# Patient Record
Sex: Male | Born: 1940
Health system: Southern US, Community
[De-identification: ages and names within clinical notes are randomized; demographics above are authoritative.]

## PROBLEM LIST (undated history)

## (undated) DIAGNOSIS — E785 Hyperlipidemia, unspecified: Secondary | ICD-10-CM

## (undated) DIAGNOSIS — Z87442 Personal history of urinary calculi: Secondary | ICD-10-CM

## (undated) DIAGNOSIS — J45909 Unspecified asthma, uncomplicated: Secondary | ICD-10-CM

## (undated) DIAGNOSIS — J309 Allergic rhinitis, unspecified: Secondary | ICD-10-CM

## (undated) DIAGNOSIS — E119 Type 2 diabetes mellitus without complications: Secondary | ICD-10-CM

## (undated) DIAGNOSIS — N529 Male erectile dysfunction, unspecified: Secondary | ICD-10-CM

## (undated) DIAGNOSIS — Z83719 Family history of colon polyps, unspecified: Secondary | ICD-10-CM

## (undated) DIAGNOSIS — L719 Rosacea, unspecified: Secondary | ICD-10-CM

## (undated) DIAGNOSIS — Z8371 Family history of colonic polyps: Secondary | ICD-10-CM

## (undated) DIAGNOSIS — I10 Essential (primary) hypertension: Secondary | ICD-10-CM

## (undated) DIAGNOSIS — I2510 Atherosclerotic heart disease of native coronary artery without angina pectoris: Secondary | ICD-10-CM

## (undated) DIAGNOSIS — Z8719 Personal history of other diseases of the digestive system: Secondary | ICD-10-CM

## (undated) DIAGNOSIS — I999 Unspecified disorder of circulatory system: Secondary | ICD-10-CM

## (undated) DIAGNOSIS — R0602 Shortness of breath: Secondary | ICD-10-CM

## (undated) HISTORY — DX: Family history of colonic polyps: Z83.71

## (undated) HISTORY — DX: Family history of colon polyps, unspecified: Z83.719

## (undated) HISTORY — DX: Hyperlipidemia, unspecified: E78.5

## (undated) HISTORY — DX: Shortness of breath: R06.02

## (undated) HISTORY — DX: Type 2 diabetes mellitus without complications: E11.9

## (undated) HISTORY — DX: Personal history of other diseases of the digestive system: Z87.19

## (undated) HISTORY — DX: Rosacea, unspecified: L71.9

## (undated) HISTORY — DX: Male erectile dysfunction, unspecified: N52.9

## (undated) HISTORY — DX: Unspecified asthma, uncomplicated: J45.909

## (undated) HISTORY — DX: Allergic rhinitis, unspecified: J30.9

## (undated) HISTORY — DX: Essential (primary) hypertension: I10

## (undated) HISTORY — DX: Unspecified disorder of circulatory system: I99.9

## (undated) HISTORY — DX: Personal history of urinary calculi: Z87.442

---

## 2002-09-21 ENCOUNTER — Ambulatory Visit (HOSPITAL_COMMUNITY): Admission: RE | Admit: 2002-09-21 | Discharge: 2002-09-21 | Payer: Self-pay | Admitting: Gastroenterology

## 2006-02-07 ENCOUNTER — Ambulatory Visit: Payer: Self-pay | Admitting: Internal Medicine

## 2006-02-07 LAB — CONVERTED CEMR LAB
ALT: 31 units/L (ref 0–40)
AST: 29 units/L (ref 0–37)
Albumin: 4 g/dL (ref 3.5–5.2)
Alkaline Phosphatase: 60 units/L (ref 39–117)
BUN: 31 mg/dL — ABNORMAL HIGH (ref 6–23)
CO2: 29 meq/L (ref 19–32)
Calcium: 9.8 mg/dL (ref 8.4–10.5)
Chloride: 103 meq/L (ref 96–112)
Creatinine, Ser: 1.3 mg/dL (ref 0.4–1.5)
GFR calc non Af Amer: 59 mL/min
Glomerular Filtration Rate, Af Am: 71 mL/min/{1.73_m2}
Glucose, Bld: 158 mg/dL — ABNORMAL HIGH (ref 70–99)
HCT: 45.4 % (ref 39.0–52.0)
Hemoglobin: 15.1 g/dL (ref 13.0–17.0)
Hgb A1c MFr Bld: 6.8 % — ABNORMAL HIGH (ref 4.6–6.0)
MCHC: 33.2 g/dL (ref 30.0–36.0)
MCV: 87.7 fL (ref 78.0–100.0)
Platelets: 187 10*3/uL (ref 150–400)
Potassium: 5.4 meq/L — ABNORMAL HIGH (ref 3.5–5.1)
RBC: 5.18 M/uL (ref 4.22–5.81)
RDW: 12.7 % (ref 11.5–14.6)
Sodium: 142 meq/L (ref 135–145)
Total Bilirubin: 0.7 mg/dL (ref 0.3–1.2)
Total Protein: 7.2 g/dL (ref 6.0–8.3)
WBC: 8.1 10*3/uL (ref 4.5–10.5)

## 2006-06-04 LAB — HM COLONOSCOPY

## 2006-06-20 ENCOUNTER — Ambulatory Visit (HOSPITAL_COMMUNITY): Admission: RE | Admit: 2006-06-20 | Discharge: 2006-06-20 | Payer: Self-pay | Admitting: Gastroenterology

## 2006-07-13 ENCOUNTER — Ambulatory Visit: Payer: Self-pay | Admitting: Internal Medicine

## 2006-07-13 LAB — CONVERTED CEMR LAB
AST: 28 units/L (ref 0–37)
BUN: 30 mg/dL — ABNORMAL HIGH (ref 6–23)
CO2: 28 meq/L (ref 19–32)
Calcium: 9.4 mg/dL (ref 8.4–10.5)
Chloride: 112 meq/L (ref 96–112)
Creatinine, Ser: 1.2 mg/dL (ref 0.4–1.5)
GFR calc Af Amer: 78 mL/min
GFR calc non Af Amer: 65 mL/min
Glucose, Bld: 151 mg/dL — ABNORMAL HIGH (ref 70–99)
Hgb A1c MFr Bld: 6.9 % — ABNORMAL HIGH (ref 4.6–6.0)
Potassium: 5.4 meq/L — ABNORMAL HIGH (ref 3.5–5.1)
Sodium: 144 meq/L (ref 135–145)
Total CK: 237 units/L (ref 7–195)

## 2006-11-08 ENCOUNTER — Encounter: Payer: Self-pay | Admitting: Internal Medicine

## 2006-11-09 ENCOUNTER — Ambulatory Visit: Payer: Self-pay | Admitting: Internal Medicine

## 2006-11-09 DIAGNOSIS — I10 Essential (primary) hypertension: Secondary | ICD-10-CM | POA: Insufficient documentation

## 2006-11-09 DIAGNOSIS — J309 Allergic rhinitis, unspecified: Secondary | ICD-10-CM

## 2006-11-09 DIAGNOSIS — E1129 Type 2 diabetes mellitus with other diabetic kidney complication: Secondary | ICD-10-CM | POA: Insufficient documentation

## 2006-11-09 DIAGNOSIS — E785 Hyperlipidemia, unspecified: Secondary | ICD-10-CM

## 2006-11-09 HISTORY — DX: Essential (primary) hypertension: I10

## 2006-11-09 HISTORY — DX: Allergic rhinitis, unspecified: J30.9

## 2006-11-09 HISTORY — DX: Hyperlipidemia, unspecified: E78.5

## 2006-11-09 LAB — CONVERTED CEMR LAB: Hgb A1c MFr Bld: 6 % (ref 4.6–6.0)

## 2006-11-16 DIAGNOSIS — J45909 Unspecified asthma, uncomplicated: Secondary | ICD-10-CM

## 2006-11-16 DIAGNOSIS — Z8719 Personal history of other diseases of the digestive system: Secondary | ICD-10-CM

## 2006-11-16 HISTORY — DX: Unspecified asthma, uncomplicated: J45.909

## 2006-11-16 HISTORY — DX: Personal history of other diseases of the digestive system: Z87.19

## 2007-02-28 ENCOUNTER — Ambulatory Visit: Payer: Self-pay | Admitting: Internal Medicine

## 2007-02-28 ENCOUNTER — Telehealth: Payer: Self-pay | Admitting: Internal Medicine

## 2007-02-28 LAB — CONVERTED CEMR LAB
ALT: 33 units/L (ref 0–53)
AST: 28 units/L (ref 0–37)
Albumin: 4.2 g/dL (ref 3.5–5.2)
Alkaline Phosphatase: 60 units/L (ref 39–117)
BUN: 36 mg/dL — ABNORMAL HIGH (ref 6–23)
Basophils Absolute: 0 10*3/uL (ref 0.0–0.1)
Basophils Relative: 0 % (ref 0.0–1.0)
Bilirubin, Direct: 0.2 mg/dL (ref 0.0–0.3)
Blood in Urine, dipstick: NEGATIVE
CO2: 27 meq/L (ref 19–32)
Calcium: 9.6 mg/dL (ref 8.4–10.5)
Chloride: 106 meq/L (ref 96–112)
Cholesterol: 126 mg/dL (ref 0–200)
Creatinine, Ser: 1.4 mg/dL (ref 0.4–1.5)
Creatinine,U: 307.2 mg/dL
Eosinophils Absolute: 0.2 10*3/uL (ref 0.0–0.6)
Eosinophils Relative: 3.4 % (ref 0.0–5.0)
GFR calc Af Amer: 65 mL/min
GFR calc non Af Amer: 54 mL/min
Glucose, Bld: 155 mg/dL — ABNORMAL HIGH (ref 70–99)
Glucose, Urine, Semiquant: NEGATIVE
HCT: 41.3 % (ref 39.0–52.0)
HDL: 32.6 mg/dL — ABNORMAL LOW (ref >39.0)
Hemoglobin: 14.7 g/dL (ref 13.0–17.0)
Hgb A1c MFr Bld: 6.5 % — ABNORMAL HIGH (ref 4.6–6.0)
LDL Cholesterol: 74 mg/dL (ref 0–99)
Lymphocytes Relative: 22.3 % (ref 12.0–46.0)
MCHC: 35.6 g/dL (ref 30.0–36.0)
MCV: 85.8 fL (ref 78.0–100.0)
Microalb Creat Ratio: 63.5 mg/g — ABNORMAL HIGH (ref 0.0–30.0)
Microalb, Ur: 19.5 mg/dL — ABNORMAL HIGH (ref 0.0–1.9)
Monocytes Absolute: 0.5 10*3/uL (ref 0.2–0.7)
Monocytes Relative: 7.6 % (ref 3.0–11.0)
Neutro Abs: 4.5 10*3/uL (ref 1.4–7.7)
Neutrophils Relative %: 66.7 % (ref 43.0–77.0)
Nitrite: NEGATIVE
PSA: 1.21 ng/mL (ref 0.10–4.00)
Platelets: 144 10*3/uL — ABNORMAL LOW (ref 150–400)
Potassium: 5 meq/L (ref 3.5–5.1)
RBC: 4.81 M/uL (ref 4.22–5.81)
RDW: 12.6 % (ref 11.5–14.6)
Sodium: 141 meq/L (ref 135–145)
Specific Gravity, Urine: >=1.03
TSH: 1.04 microintl units/mL (ref 0.35–5.50)
Total Bilirubin: 0.8 mg/dL (ref 0.3–1.2)
Total CHOL/HDL Ratio: 3.9
Total Protein: 7.1 g/dL (ref 6.0–8.3)
Triglycerides: 95 mg/dL (ref 0–149)
Urobilinogen, UA: 0.2
VLDL: 19 mg/dL (ref 0–40)
WBC Urine, dipstick: NEGATIVE
WBC: 6.7 10*3/uL (ref 4.5–10.5)
pH: 5.5

## 2007-03-14 ENCOUNTER — Ambulatory Visit: Payer: Self-pay | Admitting: Internal Medicine

## 2007-03-21 ENCOUNTER — Telehealth: Payer: Self-pay | Admitting: Internal Medicine

## 2007-03-28 ENCOUNTER — Encounter: Payer: Self-pay | Admitting: Internal Medicine

## 2007-06-09 ENCOUNTER — Telehealth: Payer: Self-pay | Admitting: Internal Medicine

## 2007-07-12 ENCOUNTER — Ambulatory Visit: Payer: Self-pay | Admitting: Internal Medicine

## 2007-07-12 LAB — CONVERTED CEMR LAB: Hgb A1c MFr Bld: 6.7 % — ABNORMAL HIGH (ref 4.6–6.0)

## 2007-11-10 ENCOUNTER — Ambulatory Visit: Payer: Self-pay | Admitting: Internal Medicine

## 2007-11-10 LAB — CONVERTED CEMR LAB: Hgb A1c MFr Bld: 6.9 % — ABNORMAL HIGH (ref 4.6–6.0)

## 2008-03-07 ENCOUNTER — Ambulatory Visit: Payer: Self-pay | Admitting: Internal Medicine

## 2008-03-07 LAB — CONVERTED CEMR LAB: Hgb A1c MFr Bld: 6.5 % — ABNORMAL HIGH (ref 4.6–6.0)

## 2008-04-02 ENCOUNTER — Encounter: Payer: Self-pay | Admitting: Internal Medicine

## 2008-07-11 ENCOUNTER — Ambulatory Visit: Payer: Self-pay | Admitting: Internal Medicine

## 2008-07-11 LAB — CONVERTED CEMR LAB
ALT: 27 units/L (ref 0–53)
AST: 26 units/L (ref 0–37)
Albumin: 4.1 g/dL (ref 3.5–5.2)
Alkaline Phosphatase: 45 units/L (ref 39–117)
BUN: 39 mg/dL — ABNORMAL HIGH (ref 6–23)
Basophils Absolute: 0 10*3/uL (ref 0.0–0.1)
Basophils Relative: 0.1 % (ref 0.0–3.0)
Bilirubin Urine: NEGATIVE
Bilirubin, Direct: 0 mg/dL (ref 0.0–0.3)
Blood in Urine, dipstick: NEGATIVE
CO2: 27 meq/L (ref 19–32)
Calcium: 9.6 mg/dL (ref 8.4–10.5)
Chloride: 109 meq/L (ref 96–112)
Cholesterol: 103 mg/dL (ref 0–200)
Creatinine, Ser: 1.4 mg/dL (ref 0.4–1.5)
Creatinine,U: 241.1 mg/dL
Eosinophils Absolute: 0.2 10*3/uL (ref 0.0–0.7)
Eosinophils Relative: 3.7 % (ref 0.0–5.0)
GFR calc non Af Amer: 53.71 mL/min (ref >60)
Glucose, Bld: 115 mg/dL — ABNORMAL HIGH (ref 70–99)
Glucose, Urine, Semiquant: NEGATIVE
HCT: 41 % (ref 39.0–52.0)
HDL: 33.6 mg/dL — ABNORMAL LOW (ref >39.00)
Hemoglobin: 14 g/dL (ref 13.0–17.0)
Hgb A1c MFr Bld: 6.3 % (ref 4.6–6.5)
LDL Cholesterol: 51 mg/dL (ref 0–99)
Lymphocytes Relative: 26.9 % (ref 12.0–46.0)
Lymphs Abs: 1.5 10*3/uL (ref 0.7–4.0)
MCHC: 34.1 g/dL (ref 30.0–36.0)
MCV: 87.6 fL (ref 78.0–100.0)
Microalb Creat Ratio: 7.1 mg/g (ref 0.0–30.0)
Microalb, Ur: 1.7 mg/dL (ref 0.0–1.9)
Monocytes Absolute: 0.4 10*3/uL (ref 0.1–1.0)
Monocytes Relative: 7.2 % (ref 3.0–12.0)
Neutro Abs: 3.6 10*3/uL (ref 1.4–7.7)
Neutrophils Relative %: 62.1 % (ref 43.0–77.0)
Nitrite: NEGATIVE
PSA: 1.02 ng/mL (ref 0.10–4.00)
Platelets: 151 10*3/uL (ref 150.0–400.0)
Potassium: 4.9 meq/L (ref 3.5–5.1)
RBC: 4.68 M/uL (ref 4.22–5.81)
RDW: 12.9 % (ref 11.5–14.6)
Sodium: 144 meq/L (ref 135–145)
Specific Gravity, Urine: 1.03
TSH: 1.1 microintl units/mL (ref 0.35–5.50)
Total Bilirubin: 0.9 mg/dL (ref 0.3–1.2)
Total CHOL/HDL Ratio: 3
Total Protein: 7.4 g/dL (ref 6.0–8.3)
Triglycerides: 91 mg/dL (ref 0.0–149.0)
Urobilinogen, UA: 0.2
VLDL: 18.2 mg/dL (ref 0.0–40.0)
WBC Urine, dipstick: NEGATIVE
WBC: 5.7 10*3/uL (ref 4.5–10.5)
pH: 5.5

## 2008-07-18 ENCOUNTER — Ambulatory Visit: Payer: Self-pay | Admitting: Internal Medicine

## 2008-08-01 ENCOUNTER — Telehealth (INDEPENDENT_AMBULATORY_CARE_PROVIDER_SITE_OTHER): Payer: Self-pay | Admitting: *Deleted

## 2008-08-05 ENCOUNTER — Encounter: Payer: Self-pay | Admitting: Internal Medicine

## 2008-08-05 ENCOUNTER — Ambulatory Visit: Payer: Self-pay

## 2008-08-06 ENCOUNTER — Telehealth: Payer: Self-pay | Admitting: Internal Medicine

## 2008-08-19 ENCOUNTER — Telehealth: Payer: Self-pay | Admitting: Internal Medicine

## 2008-08-20 ENCOUNTER — Encounter: Payer: Self-pay | Admitting: Internal Medicine

## 2008-08-20 DIAGNOSIS — I999 Unspecified disorder of circulatory system: Secondary | ICD-10-CM | POA: Insufficient documentation

## 2008-09-05 ENCOUNTER — Ambulatory Visit: Payer: Self-pay | Admitting: Internal Medicine

## 2008-09-05 DIAGNOSIS — R0602 Shortness of breath: Secondary | ICD-10-CM

## 2008-09-05 HISTORY — DX: Shortness of breath: R06.02

## 2008-09-09 ENCOUNTER — Telehealth (INDEPENDENT_AMBULATORY_CARE_PROVIDER_SITE_OTHER): Payer: Self-pay | Admitting: *Deleted

## 2008-09-10 LAB — CONVERTED CEMR LAB
BUN: 37 mg/dL — ABNORMAL HIGH (ref 6–23)
CO2: 28 meq/L (ref 19–32)
Calcium: 9.9 mg/dL (ref 8.4–10.5)
Chloride: 107 meq/L (ref 96–112)
Creatinine, Ser: 1.5 mg/dL (ref 0.4–1.5)
GFR calc non Af Amer: 49.57 mL/min (ref >60)
Glucose, Bld: 141 mg/dL — ABNORMAL HIGH (ref 70–99)
Potassium: 4.5 meq/L (ref 3.5–5.1)
Sodium: 140 meq/L (ref 135–145)

## 2008-09-12 ENCOUNTER — Ambulatory Visit: Payer: Self-pay | Admitting: Cardiovascular Disease

## 2008-09-12 ENCOUNTER — Encounter: Payer: Self-pay | Admitting: Internal Medicine

## 2008-09-12 ENCOUNTER — Ambulatory Visit (HOSPITAL_COMMUNITY): Admission: RE | Admit: 2008-09-12 | Discharge: 2008-09-12 | Payer: Self-pay | Admitting: Internal Medicine

## 2008-11-21 ENCOUNTER — Ambulatory Visit: Payer: Self-pay | Admitting: Internal Medicine

## 2008-11-25 LAB — CONVERTED CEMR LAB: Hgb A1c MFr Bld: 6.5 % (ref 4.6–6.5)

## 2009-03-06 ENCOUNTER — Ambulatory Visit: Payer: Self-pay | Admitting: Internal Medicine

## 2009-03-06 LAB — CONVERTED CEMR LAB
Blood Glucose, Fingerstick: 137
Hgb A1c MFr Bld: 6.3 % (ref 4.6–6.5)

## 2009-07-14 ENCOUNTER — Ambulatory Visit: Payer: Self-pay | Admitting: Internal Medicine

## 2009-07-14 LAB — CONVERTED CEMR LAB
ALT: 38 units/L (ref 0–53)
AST: 26 units/L (ref 0–37)
Albumin: 4.1 g/dL (ref 3.5–5.2)
Alkaline Phosphatase: 47 units/L (ref 39–117)
BUN: 38 mg/dL — ABNORMAL HIGH (ref 6–23)
Basophils Absolute: 0 10*3/uL (ref 0.0–0.1)
Basophils Relative: 0.5 % (ref 0.0–3.0)
Bilirubin Urine: NEGATIVE
Bilirubin, Direct: 0.1 mg/dL (ref 0.0–0.3)
Blood in Urine, dipstick: NEGATIVE
CO2: 25 meq/L (ref 19–32)
Calcium: 9.1 mg/dL (ref 8.4–10.5)
Chloride: 110 meq/L (ref 96–112)
Cholesterol: 102 mg/dL (ref 0–200)
Creatinine, Ser: 1.3 mg/dL (ref 0.4–1.5)
Creatinine,U: 203.7 mg/dL
Eosinophils Absolute: 0.3 10*3/uL (ref 0.0–0.7)
Eosinophils Relative: 4.1 % (ref 0.0–5.0)
GFR calc non Af Amer: 58.32 mL/min (ref >60)
Glucose, Bld: 137 mg/dL — ABNORMAL HIGH (ref 70–99)
Glucose, Urine, Semiquant: NEGATIVE
HCT: 41.3 % (ref 39.0–52.0)
HDL: 37.5 mg/dL — ABNORMAL LOW (ref >39.00)
Hemoglobin: 14.1 g/dL (ref 13.0–17.0)
Hgb A1c MFr Bld: 6.5 % (ref 4.6–6.5)
Ketones, urine, test strip: NEGATIVE
LDL Cholesterol: 48 mg/dL (ref 0–99)
Lymphocytes Relative: 27.5 % (ref 12.0–46.0)
Lymphs Abs: 1.7 10*3/uL (ref 0.7–4.0)
MCHC: 34.2 g/dL (ref 30.0–36.0)
MCV: 87.3 fL (ref 78.0–100.0)
Microalb Creat Ratio: 11.3 mg/g (ref 0.0–30.0)
Microalb, Ur: 2.3 mg/dL — ABNORMAL HIGH (ref 0.0–1.9)
Monocytes Absolute: 0.4 10*3/uL (ref 0.1–1.0)
Monocytes Relative: 6.9 % (ref 3.0–12.0)
Neutro Abs: 3.9 10*3/uL (ref 1.4–7.7)
Neutrophils Relative %: 61 % (ref 43.0–77.0)
Nitrite: NEGATIVE
PSA: 1.28 ng/mL (ref 0.10–4.00)
Platelets: 145 10*3/uL — ABNORMAL LOW (ref 150.0–400.0)
Potassium: 4.6 meq/L (ref 3.5–5.1)
Protein, U semiquant: NEGATIVE
RBC: 4.73 M/uL (ref 4.22–5.81)
RDW: 13.9 % (ref 11.5–14.6)
Sodium: 142 meq/L (ref 135–145)
Specific Gravity, Urine: 1.02
TSH: 1.19 microintl units/mL (ref 0.35–5.50)
Total Bilirubin: 0.5 mg/dL (ref 0.3–1.2)
Total CHOL/HDL Ratio: 3
Total Protein: 7.1 g/dL (ref 6.0–8.3)
Triglycerides: 82 mg/dL (ref 0.0–149.0)
Urobilinogen, UA: 0.2
VLDL: 16.4 mg/dL (ref 0.0–40.0)
WBC Urine, dipstick: NEGATIVE
WBC: 6.3 10*3/uL (ref 4.5–10.5)
pH: 5

## 2009-07-24 ENCOUNTER — Ambulatory Visit: Payer: Self-pay | Admitting: Internal Medicine

## 2009-07-24 LAB — HM DIABETES EYE EXAM: HM Diabetic Eye Exam: NORMAL

## 2009-07-30 ENCOUNTER — Telehealth: Payer: Self-pay | Admitting: Internal Medicine

## 2009-11-20 ENCOUNTER — Ambulatory Visit: Payer: Self-pay | Admitting: Internal Medicine

## 2009-11-20 LAB — CONVERTED CEMR LAB
Blood Glucose, Fingerstick: 125
Hgb A1c MFr Bld: 6.2 % (ref 4.6–6.5)

## 2009-11-20 LAB — HM DIABETES FOOT EXAM

## 2010-03-19 ENCOUNTER — Ambulatory Visit: Payer: Self-pay | Admitting: Internal Medicine

## 2010-03-19 LAB — CONVERTED CEMR LAB: Hgb A1c MFr Bld: 6.5 % (ref 4.6–6.5)

## 2010-04-07 ENCOUNTER — Telehealth: Payer: Self-pay | Admitting: Internal Medicine

## 2010-05-05 NOTE — Assessment & Plan Note (Signed)
Summary: 4 month fup//ccm   Vital Signs:  Patient profile:   70 year old male Weight:      206 pounds Temp:     98.1 degrees F oral BP sitting:   130 / 80  (left arm) Cuff size:   large  Vitals Entered By: Clearnce Sorrel CMA (November 20, 2009 8:20 AM) CC: 4 mth fup, src Is Patient Diabetic? Yes CBG Result 125   Primary Care Provider:  Marletta Lor  MD  CC:  4 mth fup and src.  History of Present Illness: 70 year-old patient who is in today for follow-up of his type 2 diabetes.  He continues to do quite well.  He has treated hypertension and dyslipidemia.  He was seen 4 months ago for a physical and hemoglobin A1c was 6.5.  He denies any cardiopulmonary complaints.  He has a history of asthma, which has been stable.  Fasting blood sugars are generally in the 125 range.  His glycemic control has not deteriorated.  He remains on metformin therapy only.  Current Medications (verified): 1)  Simvastatin 40 Mg  Tabs (Simvastatin) .[name] Kitchen.. 1 Once Daily 2)  Fexofenadine Hcl 180 Mg  Tabs (Fexofenadine Hcl) .[name] Kitchen.. 1 Once Daily 3)  Metformin  Er Hcl 500 Mg  Tabs (Metformin Hcl) .... 2 Am, 2 At Bedtime 4)  Doxazosin Mesylate 4 Mg  Tabs (Doxazosin Mesylate) .[name] Kitchen.. 1 Once Daily 5)  Adult Aspirin Ec Low Strength 81 Mg  Tbec (Aspirin) 6)  Viagra 50 Mg Tabs (Sildenafil Citrate) .... As Directed 7)  Metrogel 1 % Gel (Metronidazole) .... Use  Twice Daily 8)  Diovan 320 Mg Tabs (Valsartan) .... One Daily 9)  Accu-Chek Aviva  Strp (Glucose Blood) .... Use Daily As Directed  Allergies (verified): 1)  ! Sulf-10  Past History:  Past Medical History: Reviewed history from 03/07/2008 and no changes required. Diabetes mellitus, type II Hyperlipidemia Hypertension Allergic rhinitis colonic polyps kidney stones diverticulosis dyslipidemia High Cholesterol Asthma Diverticulitis, hx of rosace ED  Past Surgical History: Reviewed history from 03/14/2007 and no changes required. Denies surgical  history colonoscopy March 2008  Review of Systems  The patient denies anorexia, fever, weight loss, weight gain, vision loss, decreased hearing, hoarseness, chest pain, syncope, dyspnea on exertion, peripheral edema, prolonged cough, headaches, hemoptysis, abdominal pain, melena, hematochezia, severe indigestion/heartburn, hematuria, incontinence, genital sores, muscle weakness, suspicious skin lesions, transient blindness, difficulty walking, depression, unusual weight change, abnormal bleeding, enlarged lymph nodes, angioedema, breast masses, and testicular masses.    Physical Exam  General:  overweight-appearing.  126/82overweight-appearing.   Head:  Normocephalic and atraumatic without obvious abnormalities. No apparent alopecia or balding. Eyes:  No corneal or conjunctival inflammation noted. EOMI. Perrla. Funduscopic exam benign, without hemorrhages, exudates or papilledema. Vision grossly normal. Mouth:  Oral mucosa and oropharynx without lesions or exudates.  Teeth in good repair. Neck:  No deformities, masses, or tenderness noted. Lungs:  Normal respiratory effort, chest expands symmetrically. Lungs are clear to auscultation, no crackles or wheezes. Heart:  Normal rate and regular rhythm. S1 and S2 normal without gallop, murmur, click, rub or other extra sounds. Abdomen:  Bowel sounds positive,abdomen soft and non-tender without masses, organomegaly or hernias noted. Msk:  No deformity or scoliosis noted of thoracic or lumbar spine.   Pulses:  R and L carotid,radial,femoral,dorsalis pedis and posterior tibial pulses are full and equal bilaterally  Diabetes Management Exam:    Foot Exam (with socks and/or shoes not present):  Sensory-Pinprick/Light touch:          Left medial foot (L-4): normal          Left dorsal foot (L-5): normal          Left lateral foot (S-1): normal          Right medial foot (L-4): normal          Right dorsal foot (L-5): normal          Right  lateral foot (S-1): normal       Sensory-Monofilament:          Left foot: normal          Right foot: normal       Inspection:          Left foot: normal          Right foot: normal       Nails:          Left foot: normal          Right foot: normal    Foot Exam by Podiatrist:       Date: 11/20/2009       Results: no diabetic findings       Done by: PCP   Impression & Recommendations:  Problem # 1:  HYPERTENSION (ICD-401.9)  His updated medication list for this problem includes:    Doxazosin Mesylate 4 Mg Tabs (Doxazosin mesylate) .[name] Kitchen... 1 once daily    Diovan 320 Mg Tabs (Valsartan) ..... One daily  His updated medication list for this problem includes:    Doxazosin Mesylate 4 Mg Tabs (Doxazosin mesylate) .[name] Kitchen... 1 once daily    Diovan 320 Mg Tabs (Valsartan) ..... One daily  Orders: Specimen Handling (99000)  Problem # 2:  HYPERLIPIDEMIA (ICD-272.4)  His updated medication list for this problem includes:    Simvastatin 40 Mg Tabs (Simvastatin) .[name] Kitchen... 1 once daily  His updated medication list for this problem includes:    Simvastatin 40 Mg Tabs (Simvastatin) .[name] Kitchen... 1 once daily  Orders: Specimen Handling (99000)  Problem # 3:  DIABETES MELLITUS, TYPE II (ICD-250.00)  His updated medication list for this problem includes:    Adult Aspirin Ec Low Strength 81 Mg Tbec (Aspirin)    Diovan 320 Mg Tabs (Valsartan) ..... One daily  Orders: Capillary Blood Glucose/CBG [id number]) Venipuncture [id number]) TLB-A1C / Hgb A1C (Glycohemoglobin) (83036-A1C) Specimen Handling (99000)  His updated medication list for this problem includes:    Adult Aspirin Ec Low Strength 81 Mg Tbec (Aspirin)    Diovan 320 Mg Tabs (Valsartan) ..... One daily  Complete Medication List: 1)  Simvastatin 40 Mg Tabs (Simvastatin) .[name] Kitchen.. 1 once daily 2)  Fexofenadine Hcl 180 Mg Tabs (Fexofenadine hcl) .[name] Kitchen.. 1 once daily 3)  Metformin Er Hcl 500 Mg Tabs (metformin Hcl)  .... 2 am, 2 at bedtime 4)  Doxazosin  Mesylate 4 Mg Tabs (Doxazosin mesylate) .[name] Kitchen.. 1 once daily 5)  Adult Aspirin Ec Low Strength 81 Mg Tbec (Aspirin) 6)  Viagra 50 Mg Tabs (Sildenafil citrate) .... As directed 7)  Metrogel 1 % Gel (Metronidazole) .... Use  twice daily 8)  Diovan 320 Mg Tabs (Valsartan) .... One daily 9)  Accu-chek Aviva Strp (Glucose blood) .... Use daily as directed  Patient Instructions: 1)  Please schedule a follow-up appointment in 4 months. 2)  Limit your Sodium (Salt) to less than 2 grams a day(slightly less than 1/2 a teaspoon) to prevent fluid retention, swelling, or worsening of symptoms. 3)  It is important that you exercise regularly at least 20 minutes 5 times a week. If you develop chest pain, have severe difficulty breathing, or feel very tired , stop exercising immediately and seek medical attention. 4)  You need to lose weight. Consider a lower calorie diet and regular exercise.  5)  Check your blood sugars regularly. If your readings are usually above : or below 70 you should contact our office. 6)  It is important that your Diabetic A1c level is checked every 3 months. 7)  See your eye doctor yearly to check for diabetic eye damage. Prescriptions: ACCU-CHEK AVIVA  STRP (GLUCOSE BLOOD) use daily as directed  #90 x 6   Entered and Authorized by:   Marletta Lor  MD   Signed by:   Marletta Lor  MD on 11/20/2009   Method used:   Print then Give to Patient   RxID:   [id number] DIOVAN 320 MG TABS (VALSARTAN) one daily  #90 x 6   Entered and Authorized by:   Marletta Lor  MD   Signed by:   Marletta Lor  MD on 11/20/2009   Method used:   Print then Give to Patient   RxID:   [id number] METROGEL 1 % GEL (METRONIDAZOLE) use  twice daily  #45 gm x 4   Entered and Authorized by:   Marletta Lor  MD   Signed by:   Marletta Lor  MD on 11/20/2009   Method used:   Print then Give to Patient   RxID:   [id number] VIAGRA 50 MG TABS (SILDENAFIL  CITRATE) as directed  #12 x 6   Entered and Authorized by:   Marletta Lor  MD   Signed by:   Marletta Lor  MD on 11/20/2009   Method used:   Print then Give to Patient   RxID:   [id number] DOXAZOSIN MESYLATE 4 MG  TABS (DOXAZOSIN MESYLATE) 1 once daily  #90 x 6   Entered and Authorized by:   Marletta Lor  MD   Signed by:   Marletta Lor  MD on 11/20/2009   Method used:   Print then Give to Patient   RxID:   [id number] FEXOFENADINE HCL 180 MG  TABS (FEXOFENADINE HCL) 1 once daily  #90 x 6   Entered and Authorized by:   Marletta Lor  MD   Signed by:   Marletta Lor  MD on 11/20/2009   Method used:   Print then Give to Patient   RxID:   [id number] SIMVASTATIN 40 MG  TABS (SIMVASTATIN) 1 once daily  #90 x 6   Entered and Authorized by:   Marletta Lor  MD   Signed by:   Marletta Lor  MD on 11/20/2009   Method used:   Print then Give to Patient   RxID:   [id number] [place] (GLUCOSE BLOOD) use daily as directed  #90 x 6   Entered and Authorized by:   Marletta Lor  MD   Signed by:   Marletta Lor  MD on 11/20/2009   Method used:   Electronically to        [facility] (mail-order)       [place]  [address]       Ph: [phone]       Fax: [phone]   RxID:  [id number] DIOVAN 320 MG TABS (VALSARTAN) one daily  #90 x 6   Entered and Authorized by:   Marletta Lor  MD   Signed by:   Marletta Lor  MD on 11/20/2009   Method used:   Electronically to        [facility] (mail-[place]  [address]       Ph: [phone]       Fax: [phone]   [id number] VIAGRA 50 MG TABS (SILDENAFIL CITRATE) as directed  #12 x 6   Entered and Authorized by:   Marletta Lor  MD   Signed by:   Marletta Lor  MD on 11/20/2009   Method used:    Electronically to        [facility] (mail-[place])       [place]  [address]       Ph: [phone]       Fax: [phone]   [id number] METROGEL 1 % GEL (METRONIDAZOLE) use  twice daily  #45 gm x 4   Entered and Authorized by:   Marletta Lor  MD   Signed by:   Marletta Lor  MD on 11/20/2009   Method used:   Electronically to        [facility] (mail-order)       [place]  [address]       Ph: [phone]       Fax: [phone]   [id number] DOXAZOSIN MESYLATE 4 MG  TABS (DOXAZOSIN MESYLATE) 1 once daily  #90 x 6   Entered and Authorized by:   Marletta Lor  MD   Signed by:   Marletta Lor  MD on 11/20/2009   Method used:   Electronically to        [facility] (mail-order)       [place]  [address]       Ph: [phone]       Fax: [phone]   [id number] METFORMIN  ER HCL 500 MG  TABS (METFORMIN HCL) 2 am, 2 at bedtime  #360 x 3   Entered and Authorized by:   Marletta Lor  MD   Signed by:   Marletta Lor  MD on 11/20/2009   Method used:   Print then Give to Patient   RxID:   [id number] FEXOFENADINE HCL 180 MG  TABS (FEXOFENADINE HCL) 1 once daily  #90 x 6   Entered and Authorized by:   Marletta Lor  MD   Signed by:   Marletta Lor  MD on 11/20/2009   Method used:   Electronically to        [facility] (mail-order)       [place]  [address]       Ph: [phone]       Fax: [phone]   [id number] SIMVASTATIN 40 MG  TABS (SIMVASTATIN) 1 once daily  #90 x 6   Entered and Authorized by:   Marletta Lor  MD   Signed  by:   Marletta Lor  MD on 11/20/2009   Method used:   Electronically to        [facility] (mail-order)       [place]  [address]       Ph: [phone]       Fax: [phone]   RxID:   [id number]

## 2010-05-05 NOTE — Progress Notes (Signed)
  Phone Note Call from Patient   Caller: Patient Call For: Marletta Lor  MD Summary of Call: Still waiting to hear if Dr Raliegh Ip talked to Dr Harrington Challenger about his stress test- she said there was a problem. Wants Dr Raliegh Ip to call. [phone]. Initial call taken by: Chipper Oman, RN,  Aug 19, 2008 8:49 AM    Please schedule a cardiology appointment with Dr. Harrington Challenger due to abnormal Cardiolite stress

## 2010-05-05 NOTE — Assessment & Plan Note (Signed)
Summary: cpx/njr   Vital Signs:  Patient profile:   70 year old male Height:      67.75 inches Weight:      207 pounds BMI:     31.82 BP sitting:   140 / 62  (left arm) Cuff size:   regular  Vitals Entered By: Chipper Oman, RN (July 18, 2008 9:40 AM)  CC:  OV and labs done.  BS 124. C/o low energy.[name] Kitchen  History of Present Illness: 70 year old male seen today for comprehensive evaluation.  Medical problems include dyslipidemia, hypertension, type 2 diabetes.  Laboratory studies were reviewed and his hemoglobin A1c6.3.  Other laboratory studies were stable.  Impression include some diminished exercise capacity, but no exertional chest pain. He has remained reasonably active.  He had a Cardiolite stress test 10 to 15 years ago.  He has remote history of asthma and allergic rhinitis, which have been stable  Problems Prior to Update: 1)  Physical Examination  (ICD-V70.0) 2)  Diverticulitis, Hx of  (ICD-V12.79) 3)  Asthma  (ICD-493.90) 4)  Allergic Rhinitis  (ICD-477.9) 5)  Hypertension  (ICD-401.9) 6)  Hyperlipidemia  (ICD-272.4) 7)  Diabetes Mellitus, Type II  (ICD-250.00)  Medications Prior to Update: 1)  Simvastatin 40 Mg  Tabs (Simvastatin) .[name] Kitchen.. 1 Once Daily 2)  Fexofenadine Hcl 180 Mg  Tabs (Fexofenadine Hcl) .[name] Kitchen.. 1 Once Daily 3)  Metformin  Er Hcl 500 Mg  Tabs (Metformin Hcl) .... 2 Am, 2 At Bedtime 4)  Doxazosin Mesylate 4 Mg  Tabs (Doxazosin Mesylate) .[name] Kitchen.. 1 Once Daily 5)  Adult Aspirin Ec Low Strength 81 Mg  Tbec (Aspirin) 6)  Diovan Hct 320-25 Mg  Tabs (Valsartan-Hydrochlorothiazide) .... One Daily 7)  Viagra 50 Mg Tabs (Sildenafil Citrate) .... As Directed 8)  Metrogel 1 % Gel (Metronidazole) .... Use  Twice Daily  Allergies: 1)  ! Sulf-10  Past History:  Past Medical History:    Reviewed history from 03/07/2008 and no changes required:    Diabetes mellitus, type II    Hyperlipidemia    Hypertension    Allergic rhinitis    colonic polyps    kidney stones  diverticulosis    dyslipidemia    High Cholesterol    Asthma    Diverticulitis, hx of    rosace    ED  Past Surgical History:    Reviewed history from 03/14/2007 and no changes required:    Denies surgical history    colonoscopy March 2008  Family History:    Reviewed history from 11/09/2006 and no changes required:       father died at 27 of lung cancer.  Also had prostate cancer       mother died at 68.  Hypertension, multiple myeloma       one sister living and well  Social History:    Reviewed history from 11/16/2006 and no changes required:       Occupation: [employer]       Married       Never Smoked       Alcohol use-yes       Drug use-no       Regular exercise-no  Review of Systems  The patient denies anorexia, fever, weight loss, weight gain, vision loss, decreased hearing, hoarseness, chest pain, syncope, dyspnea on exertion, peripheral edema, prolonged cough, headaches, hemoptysis, abdominal pain, melena, hematochezia, severe indigestion/heartburn, hematuria, incontinence, genital sores, muscle weakness, suspicious skin lesions, transient blindness, difficulty walking, depression, unusual weight change, abnormal bleeding, enlarged  lymph nodes, angioedema, breast masses, and testicular masses.    Physical Exam  General:  overweight-appearing.  140/64 Head:  Normocephalic and atraumatic without obvious abnormalities. No apparent alopecia or balding. Eyes:  No corneal or conjunctival inflammation noted. EOMI. Perrla. Funduscopic exam benign, without hemorrhages, exudates or papilledema. Vision grossly normal. Ears:  External ear exam shows no significant lesions or deformities.  Otoscopic examination reveals clear canals, tympanic membranes are intact bilaterally without bulging, retraction, inflammation or discharge. Hearing is grossly normal bilaterally. Nose:  External nasal examination shows no deformity or inflammation. Nasal mucosa are pink and moist without  lesions or exudates. Mouth:  Oral mucosa and oropharynx without lesions or exudates.  Teeth in good repair. Neck:  No deformities, masses, or tenderness noted. Chest Wall:  No deformities, masses, tenderness or gynecomastia noted. Breasts:  No masses or gynecomastia noted Lungs:  Normal respiratory effort, chest expands symmetrically. Lungs are clear to auscultation, no crackles or wheezes. Heart:  Normal rate and regular rhythm. S1 and S2 normal without gallop, murmur, click, rub or other extra sounds. Abdomen:  Bowel sounds positive,abdomen soft and non-tender without masses, organomegaly or hernias noted. Rectal:  No external abnormalities noted. Normal sphincter tone. No rectal masses or tenderness. Genitalia:  Testes bilaterally descended without nodularity, tenderness or masses. No scrotal masses or lesions. No penis lesions or urethral discharge. Prostate:  2+ enlarged.   Msk:  No deformity or scoliosis noted of thoracic or lumbar spine.   Pulses:  full Extremities:  No clubbing, cyanosis, edema, or deformity noted with normal full range of motion of all joints.   Neurologic:  No cranial nerve deficits noted. Station and gait are normal. Plantar reflexes are down-going bilaterally. DTRs are symmetrical throughout. Sensory, motor and coordinative functions appear intact. Skin:  Intact without suspicious lesions or rashes Cervical Nodes:  No lymphadenopathy noted Axillary Nodes:  No palpable lymphadenopathy Inguinal Nodes:  No significant adenopathy Psych:  Cognition and judgment appear intact. Alert and cooperative with normal attention span and concentration. No apparent delusions, illusions, hallucinations   Impression & Recommendations:  Problem # 1:  ALLERGIC RHINITIS (ICD-477.9)  His updated medication list for this problem includes:    Fexofenadine Hcl 180 Mg Tabs (Fexofenadine hcl) .[name] Kitchen... 1 once daily  Problem # 2:  HYPERTENSION (ICD-401.9)  His updated medication list for  this problem includes:    Doxazosin Mesylate 4 Mg Tabs (Doxazosin mesylate) .[name] Kitchen... 1 once daily    Diovan Hct 320-25 Mg Tabs (Valsartan-hydrochlorothiazide) ..... One daily  Orders: EKG w/ Interpretation (93000) Cardiolite (Cardiolite)  Problem # 3:  HYPERLIPIDEMIA (ICD-272.4)  His updated medication list for this problem includes:    Simvastatin 40 Mg Tabs (Simvastatin) .[name] Kitchen... 1 once daily  Orders: Cardiolite (Cardiolite)  Problem # 4:  DIABETES MELLITUS, TYPE II (ICD-250.00)  His updated medication list for this problem includes:    Adult Aspirin Ec Low Strength 81 Mg Tbec (Aspirin)    Diovan Hct 320-25 Mg Tabs (Valsartan-hydrochlorothiazide) ..... One daily  Orders: Cardiolite (Cardiolite)  Complete Medication List: 1)  Simvastatin 40 Mg Tabs (Simvastatin) .[name] Kitchen.. 1 once daily 2)  Fexofenadine Hcl 180 Mg Tabs (Fexofenadine hcl) .[name] Kitchen.. 1 once daily 3)  Metformin Er Hcl 500 Mg Tabs (metformin Hcl)  .... 2 am, 2 at bedtime 4)  Doxazosin Mesylate 4 Mg Tabs (Doxazosin mesylate) .[name] Kitchen.. 1 once daily 5)  Adult Aspirin Ec Low Strength 81 Mg Tbec (Aspirin) 6)  Diovan Hct 320-25 Mg Tabs (Valsartan-hydrochlorothiazide) .... One daily 7)  Viagra  50 Mg Tabs (Sildenafil citrate) .... As directed 8)  Metrogel 1 % Gel (Metronidazole) .... Use  twice daily  Other Orders: DRE [id number])  Patient Instructions: 1)  Please schedule a follow-up appointment in 4 months. 2)  It is important that you exercise regularly at least 20 minutes 5 times a week. If you develop chest pain, have severe difficulty breathing, or feel very tired , stop exercising immediately and seek medical attention. 3)  You need to lose weight. Consider a lower calorie diet and regular exercise.  4)  Take an Aspirin every day. 5)  Check your blood sugars regularly. If your readings are usually above : or below 70 you should contact our office. 6)  It is important that your Diabetic A1c level is checked every 3 months. 7)  See your  eye doctor yearly to check for diabetic eye damage. Prescriptions: METROGEL 1 % GEL (METRONIDAZOLE) use  twice daily  #45 gm x 4   Entered and Authorized by:   Marletta Lor  MD   Signed by:   Marletta Lor  MD on 07/18/2008   Method used:   Print then Give to Patient   RxID:   [id number] VIAGRA 50 MG TABS (SILDENAFIL CITRATE) as directed  #12 x 6   Entered and Authorized by:   Marletta Lor  MD   Signed by:   Marletta Lor  MD on 07/18/2008   Method used:   Print then Give to Patient   RxID:   [id number] DIOVAN HCT 320-25 MG  TABS (VALSARTAN-HYDROCHLOROTHIAZIDE) one daily  #90 x 6   Entered and Authorized by:   Marletta Lor  MD   Signed by:   Marletta Lor  MD on 07/18/2008   Method used:   Print then Give to Patient   RxID:   [id number] DOXAZOSIN MESYLATE 4 MG  TABS (DOXAZOSIN MESYLATE) 1 once daily  #90 x 6   Entered and Authorized by:   Marletta Lor  MD   Signed by:   Marletta Lor  MD on 07/18/2008   Method used:   Print then Give to Patient   RxID:   [id number] METFORMIN  ER HCL 500 MG  TABS (METFORMIN HCL) 2 am, 2 at bedtime  #360 x 6   Entered and Authorized by:   Marletta Lor  MD   Signed by:   Marletta Lor  MD on 07/18/2008   Method used:   Print then Give to Patient   RxID:   [id number] FEXOFENADINE HCL 180 MG  TABS (FEXOFENADINE HCL) 1 once daily  #90 x 6   Entered and Authorized by:   Marletta Lor  MD   Signed by:   Marletta Lor  MD on 07/18/2008   Method used:   Print then Give to Patient   RxID:   [id number] SIMVASTATIN 40 MG  TABS (SIMVASTATIN) 1 once daily  #90 x 6   Entered and Authorized by:   Marletta Lor  MD   Signed by:   Marletta Lor  MD on 07/18/2008   Method used:   Print then Give to Patient   RxID:   [id number]

## 2010-05-05 NOTE — Assessment & Plan Note (Signed)
Summary: 4 MONTH ROV/NJR   Vital Signs:  Patient profile:   70 year old male Weight:      205 pounds BMI:     31.28 Temp:     98 degrees F oral BP sitting:   126 / 72  (left arm) Cuff size:   regular  Vitals Entered By: Chipper Oman, RN (November 21, 2008 10:08 AM)  Primary Care Provider:  Marletta Lor  MD  CC:  ROV and FBS 112.[name] Kitchen  History of Present Illness: 70 year old patient who is seen today for follow-up.  He has  hypertension, type 2 diabetes, as well as dyslipidemia.  He has had a recent cardiac evaluation was also included a chest CT scan that was nonrevealing.  He does have a history of asthma, which has been stable.  His main complaint is decrease in his exercise capacity.  His diabetes has been stable.  His last hemoglobin A1c6.3  Allergies: 1)  ! Sulf-10  Past History:  Past Medical History: Reviewed history from 03/07/2008 and no changes required. Diabetes mellitus, type II Hyperlipidemia Hypertension Allergic rhinitis colonic polyps kidney stones diverticulosis dyslipidemia High Cholesterol Asthma Diverticulitis, hx of rosace ED  Physical Exam  General:  Well-developed,well-nourished,in no acute distress; alert,appropriate and cooperative throughout examination  120/80 Head:  Normocephalic and atraumatic without obvious abnormalities. No apparent alopecia or balding. Mouth:  Oral mucosa and oropharynx without lesions or exudates.  Teeth in good repair. Neck:  No deformities, masses, or tenderness noted. Lungs:  Normal respiratory effort, chest expands symmetrically. Lungs are clear to auscultation, no crackles or wheezes. Heart:  Normal rate and regular rhythm. S1 and S2 normal without gallop, murmur, click, rub or other extra sounds. Abdomen:  Bowel sounds positive,abdomen soft and non-tender without masses, organomegaly or hernias noted. Msk:  No deformity or scoliosis noted of thoracic or lumbar spine.   Pulses:  R and L  carotid,radial,femoral,dorsalis pedis and posterior tibial pulses are full and equal bilaterally Extremities:  No clubbing, cyanosis, edema, or deformity noted with normal full range of motion of all joints.     Impression & Recommendations:  Problem # 1:  SHORTNESS OF BREATH (ICD-786.05) will encourage the weight loss, exercise program; if difficulties continue will consider PFTs to rule out bronchospastic disease  Problem # 2:  HYPERTENSION (ICD-401.9)  The following medications were removed from the medication list:    Metoprolol Tartrate 50 Mg Tabs (Metoprolol tartrate) .[name] Kitchen... 1 tab night before cta and 1 tab am of cta His updated medication list for this problem includes:    Doxazosin Mesylate 4 Mg Tabs (Doxazosin mesylate) .[name] Kitchen... 1 once daily    Diovan Hct 320-25 Mg Tabs (Valsartan-hydrochlorothiazide) ..... One daily  The following medications were removed from the medication list:    Metoprolol Tartrate 50 Mg Tabs (Metoprolol tartrate) .[name] Kitchen... 1 tab night before cta and 1 tab am of cta His updated medication list for this problem includes:    Doxazosin Mesylate 4 Mg Tabs (Doxazosin mesylate) .[name] Kitchen... 1 once daily    Diovan Hct 320-25 Mg Tabs (Valsartan-hydrochlorothiazide) ..... One daily  Problem # 3:  DIABETES MELLITUS, TYPE II (ICD-250.00)  His updated medication list for this problem includes:    Adult Aspirin Ec Low Strength 81 Mg Tbec (Aspirin)    Diovan Hct 320-25 Mg Tabs (Valsartan-hydrochlorothiazide) ..... One daily    His updated medication list for this problem includes:    Adult Aspirin Ec Low Strength 81 Mg Tbec (Aspirin)    Diovan  Hct 320-25 Mg Tabs (Valsartan-hydrochlorothiazide) ..... One daily  Orders: Venipuncture [id number]) TLB-A1C / Hgb A1C (Glycohemoglobin) (83036-A1C)  Complete Medication List: 1)  Simvastatin 40 Mg Tabs (Simvastatin) .[name] Kitchen.. 1 once daily 2)  Fexofenadine Hcl 180 Mg Tabs (Fexofenadine hcl) .[name] Kitchen.. 1 once daily 3)  Metformin Er Hcl 500 Mg Tabs  (metformin Hcl)  .... 2 am, 2 at bedtime 4)  Doxazosin Mesylate 4 Mg Tabs (Doxazosin mesylate) .[name] Kitchen.. 1 once daily 5)  Adult Aspirin Ec Low Strength 81 Mg Tbec (Aspirin) 6)  Diovan Hct 320-25 Mg Tabs (Valsartan-hydrochlorothiazide) .... One daily 7)  Viagra 50 Mg Tabs (Sildenafil citrate) .... As directed 8)  Metrogel 1 % Gel (Metronidazole) .... Use  twice daily  Patient Instructions: 1)  Limit your Sodium (Salt) to less than 2 grams a day(slightly less than 1/2 a teaspoon) to prevent fluid retention, swelling, or worsening of symptoms. 2)  It is important that you exercise regularly at least 20 minutes 5 times a week. If you develop chest pain, have severe difficulty breathing, or feel very tired , stop exercising immediately and seek medical attention. 3)  You need to lose weight. Consider a lower calorie diet and regular exercise.  4)  Check your Blood Pressure regularly. If it is above: 140/90  you should make an appointment. 5)  Please schedule a follow-up appointment in 4 months. Prescriptions: DOXAZOSIN MESYLATE 4 MG  TABS (DOXAZOSIN MESYLATE) 1 once daily  #90 x 6   Entered and Authorized by:   Marletta Lor  MD   Signed by:   Marletta Lor  MD on 11/21/2008   Method used:   Print then Give to Patient   RxID:   [id number] METFORMIN  ER HCL 500 MG  TABS (METFORMIN HCL) 2 am, 2 at bedtime  #360 x 6   Entered and Authorized by:   Marletta Lor  MD   Signed by:   Marletta Lor  MD on 11/21/2008   Method used:   Print then Give to Patient   RxID:   [id number] FEXOFENADINE HCL 180 MG  TABS (FEXOFENADINE HCL) 1 once daily  #90 x 6   Entered and Authorized by:   Marletta Lor  MD   Signed by:   Marletta Lor  MD on 11/21/2008   Method used:   Print then Give to Patient   RxID:   [id number] SIMVASTATIN 40 MG  TABS (SIMVASTATIN) 1 once daily  #90 x 6   Entered and Authorized by:   Marletta Lor  MD   Signed by:   Marletta Lor  MD on 11/21/2008   Method used:   Print then Give to Patient   RxID:   [id number]

## 2010-05-05 NOTE — Letter (Signed)
Summary: diabetic eye exam  diabetic eye exam   Imported By: Jamelle Haring 04/19/2007 09:55:35  _____________________________________________________________________  External Attachment:    Type:   Image     Comment:   diabetic eye exam

## 2010-05-05 NOTE — Progress Notes (Signed)
Summary:  refill metformin  Phone Note Refill Request Call back at Home Phone [phone] Message from:  Patient---live call  Refills Requested: Medication #1:  METFORMIN  ER HCL 500 MG  TABS (METFORMIN HCL) 2 am fax to [facility]. He only have 5 days left. Need samples or 30 day called to [facility] on [address].  Initial call taken by: Despina Arias,  July 30, 2009 8:24 AM    Prescriptions: METFORMIN  ER HCL 500 MG  TABS (METFORMIN HCL) 2 am, 2 at bedtime  #360 x 3   Entered by:   Cay Schillings LPN   Authorized by:   Marletta Lor  MD   Signed by:   Cay Schillings LPN on [id number]   Method used:   Faxed to ...       [facility]* (mail-order)       [place]  [address]       Ph: [phone]       Fax: [phone]   RxID:   [id number] METFORMIN  ER HCL 500 MG  TABS (METFORMIN HCL) 2 am, 2 at bedtime  #120 x 1   Entered by:   Cay Schillings LPN   Authorized by:   Marletta Lor  MD   Signed by:   Cay Schillings LPN on [id number]   Method used:   Faxed to ...       [facility]  [phone]* (retail)       [address]       [place], [place]  [address]       Ph: [phone] or [phone]       Fax: [phone]   RxID:   [id number]  rx faxed to [facility] (30 day)   and to [facility]  patient aware

## 2010-05-05 NOTE — Progress Notes (Signed)
Summary: written rx   Phone Note Call from Patient Call back at Home Phone [phone]   Caller: patient live Call For: K  Summary of Call: needs new rx for his new insuarnce company for metformin 500 mg ER.  Would like to pick up rx when ready please call him.   Initial call taken by: Shanon Payor,  June 09, 2007 12:10 PM      Prescriptions: METFORMIN  ER HCL 500 MG  TABS (METFORMIN HCL) 1 am, 2 at bedtime  #90 x 11   Entered by:   Chipper Oman, RN   Authorized by:   Marletta Lor  MD   Signed by:   Chipper Oman, RN on 06/09/2007   Method used:   Print then Give to Patient   RxID:   [id number]

## 2010-05-05 NOTE — Assessment & Plan Note (Signed)
Summary: cpx/njr/pt rsc/cjr   Vital Signs:  Patient profile:   70 year old male Weight:      206 pounds Temp:     98.0 degrees F oral BP sitting:   100 / 60  (right arm) Cuff size:   large  Vitals Entered By: Cay Schillings LPN (April 21, [id number] [id number] PM) CC: cpx - doing well     fbs 137 Is Patient Diabetic? Yes Did you bring your meter with you today? No   Primary Care Provider:  Marletta Lor  MD  CC:  cpx - doing well     fbs 137.  History of Present Illness:  70 year old patient who is seen today for a comprehensive evaluation.  medical problems include hypertension, dyslipidemia, and type 2 diabetes.  He had an extensive cardiac evaluation last year.  That was unremarkable.  Clinically, he has done quite well.  Blood pressure more recently has been running lower than his norm.  He does have some rare mild orthostatic symptoms.  He has a history of asthma, which has been stable.  Preventive Screening-Counseling & Management  Alcohol-Tobacco     Smoking Status: quit     Year Quit: 30 yrs ago  Allergies: 1)  ! Sulf-10  Past History:  Past Medical History: Reviewed history from 03/07/2008 and no changes required. Diabetes mellitus, type II Hyperlipidemia Hypertension Allergic rhinitis colonic polyps kidney stones diverticulosis dyslipidemia High Cholesterol Asthma Diverticulitis, hx of rosace ED  Past Surgical History: Reviewed history from 03/14/2007 and no changes required. Denies surgical history colonoscopy March 2008  Family History: Reviewed history from 07/18/2008 and no changes required. father died at 64 of lung cancer.  Also had prostate cancer mother died at 69.  Hypertension, multiple myeloma one sister living and well;   Social History: Reviewed history from 11/16/2006 and no changes required. Occupation: Manufacturing engineer Married Never Smoked Alcohol use-yes Drug use-no Regular exercise-no Smoking Status:  quit  Review of  Systems  The patient denies anorexia, fever, weight loss, weight gain, vision loss, decreased hearing, hoarseness, chest pain, syncope, dyspnea on exertion, peripheral edema, prolonged cough, headaches, hemoptysis, abdominal pain, melena, severe indigestion/heartburn, hematuria, incontinence, genital sores, muscle weakness, suspicious skin lesions, transient blindness, difficulty walking, depression, unusual weight change, abnormal bleeding, enlarged lymph nodes, angioedema, breast masses, and testicular masses.    Physical Exam  General:  overweight-appearing.  100/60 Head:  Normocephalic and atraumatic without obvious abnormalities. No apparent alopecia or balding. Eyes:  No corneal or conjunctival inflammation noted. EOMI. Perrla. Funduscopic exam benign, without hemorrhages, exudates or papilledema. Vision grossly normal. Ears:  External ear exam shows no significant lesions or deformities.  Otoscopic examination reveals clear canals, tympanic membranes are intact bilaterally without bulging, retraction, inflammation or discharge. Hearing is grossly normal bilaterally. Nose:  External nasal examination shows no deformity or inflammation. Nasal mucosa are pink and moist without lesions or exudates. Mouth:  Oral mucosa and oropharynx without lesions or exudates.  Teeth in good repair. Neck:  No deformities, masses, or tenderness noted. Chest Wall:  No deformities, masses, tenderness or gynecomastia noted. Breasts:  No masses or gynecomastia noted Lungs:  Normal respiratory effort, chest expands symmetrically. Lungs are clear to auscultation, no crackles or wheezes. Heart:  Normal rate and regular rhythm. S1 and S2 normal without gallop, murmur, click, rub or other extra sounds. Abdomen:  Bowel sounds positive,abdomen soft and non-tender without masses, organomegaly or hernias noted. Rectal:  No external abnormalities noted. Normal sphincter tone. No rectal masses  or tenderness. Genitalia:   Testes bilaterally descended without nodularity, tenderness or masses. No scrotal masses or lesions. No penis lesions or urethral discharge. Prostate:  2+ enlarged.  2+ enlarged.   Msk:  No deformity or scoliosis noted of thoracic or lumbar spine.   Pulses:  R and L carotid,radial,femoral,dorsalis pedis and posterior tibial pulses are full and equal bilaterally Extremities:  No clubbing, cyanosis, edema, or deformity noted with normal full range of motion of all joints.   Neurologic:  alert & oriented X3, cranial nerves II-XII intact, strength normal in all extremities, gait normal, and DTRs symmetrical and normal.  alert & oriented X3, cranial nerves II-XII intact, strength normal in all extremities, gait normal, and DTRs symmetrical and normal.   Skin:  Intact without suspicious lesions or rashes Cervical Nodes:  No lymphadenopathy noted Axillary Nodes:  No palpable lymphadenopathy Inguinal Nodes:  No significant adenopathy Psych:  Cognition and judgment appear intact. Alert and cooperative with normal attention span and concentration. No apparent delusions, illusions, hallucinations  Diabetes Management Exam:    Foot Exam (with socks and/or shoes not present):       Sensory-Pinprick/Light touch:          Left medial foot (L-4): normal          Left dorsal foot (L-5): normal          Left lateral foot (S-1): normal          Right medial foot (L-4): normal          Right dorsal foot (L-5): normal          Right lateral foot (S-1): normal       Sensory-Monofilament:          Left foot: normal          Right foot: normal       Inspection:          Left foot: normal          Right foot: normal       Nails:          Left foot: normal          Right foot: normal    Foot Exam by Podiatrist:       Date: 07/24/2009       Results: no diabetic findings       Done by: PCP    Eye Exam:       Eye Exam done here today          Results: normal   Impression & Recommendations:  Problem # 1:   HYPERTENSION (ICD-401.9)  The following medications were removed from the medication list:    Diovan Hct 320-25 Mg Tabs (Valsartan-hydrochlorothiazide) ..... One daily His updated medication list for this problem includes:    Doxazosin Mesylate 4 Mg Tabs (Doxazosin mesylate) .[name] Kitchen... 1 once daily    Diovan 320 Mg Tabs (Valsartan) ..... One daily  Orders: EKG w/ Interpretation (93000)  The following medications were removed from the medication list:    Diovan Hct 320-25 Mg Tabs (Valsartan-hydrochlorothiazide) ..... One daily His updated medication list for this problem includes:    Doxazosin Mesylate 4 Mg Tabs (Doxazosin mesylate) .[name] Kitchen... 1 once daily    Diovan 320 Mg Tabs (Valsartan) ..... One daily  Problem # 2:  HYPERLIPIDEMIA (ICD-272.4)  His updated medication list for this problem includes:    Simvastatin 40 Mg Tabs (Simvastatin) .[name] Kitchen... 1 once daily  His updated medication list for this  problem includes:    Simvastatin 40 Mg Tabs (Simvastatin) .[name] Kitchen... 1 once daily  Problem # 3:  DIABETES MELLITUS, TYPE II (ICD-250.00)  The following medications were removed from the medication list:    Diovan Hct 320-25 Mg Tabs (Valsartan-hydrochlorothiazide) ..... One daily His updated medication list for this problem includes:    Adult Aspirin Ec Low Strength 81 Mg Tbec (Aspirin)    Diovan 320 Mg Tabs (Valsartan) ..... One daily  The following medications were removed from the medication list:    Diovan Hct 320-25 Mg Tabs (Valsartan-hydrochlorothiazide) ..... One daily His updated medication list for this problem includes:    Adult Aspirin Ec Low Strength 81 Mg Tbec (Aspirin)    Diovan 320 Mg Tabs (Valsartan) ..... One daily  Complete Medication List: 1)  Simvastatin 40 Mg Tabs (Simvastatin) .[name] Kitchen.. 1 once daily 2)  Fexofenadine Hcl 180 Mg Tabs (Fexofenadine hcl) .[name] Kitchen.. 1 once daily 3)  Metformin Er Hcl 500 Mg Tabs (metformin Hcl)  .... 2 am, 2 at bedtime 4)  Doxazosin Mesylate 4 Mg Tabs  (Doxazosin mesylate) .[name] Kitchen.. 1 once daily 5)  Adult Aspirin Ec Low Strength 81 Mg Tbec (Aspirin) 6)  Viagra 50 Mg Tabs (Sildenafil citrate) .... As directed 7)  Metrogel 1 % Gel (Metronidazole) .... Use  twice daily 8)  Diovan 320 Mg Tabs (Valsartan) .... One daily 9)  Accu-chek Aviva Strp (Glucose blood) .... Use daily as directed  Patient Instructions: 1)  Please schedule a follow-up appointment in 4 months. 2)  Limit your Sodium (Salt). 3)  It is important that you exercise regularly at least 20 minutes 5 times a week. If you develop chest pain, have severe difficulty breathing, or feel very tired , stop exercising immediately and seek medical attention. 4)  You need to lose weight. Consider a lower calorie diet and regular exercise.  5)  Check your blood sugars regularly. If your readings are usually above : or below 70 you should contact our office. 6)  It is important that your Diabetic A1c level is checked every 3 months. 7)  See your eye doctor yearly to check for diabetic eye damage. 8)  Check your Blood Pressure regularly. If it is above: 140/90 you should make an appointment. Prescriptions: ACCU-CHEK AVIVA  STRP (GLUCOSE BLOOD) use daily as directed  #90 x 6   Entered and Authorized by:   Marletta Lor  MD   Signed by:   Marletta Lor  MD on 07/24/2009   Method used:   Electronically to        [facility] (mail-order)       [place]  [address]       Ph: [phone]       Fax: [phone]   [id number] ACCU-CHEK AVIVA  STRP (GLUCOSE BLOOD) use daily as directed  #90 x 6   Entered and Authorized by:   Marletta Lor  MD   Signed by:   Marletta Lor  MD on 07/24/2009   Method used:   Print then Give to Patient   RxID:   [id number] DIOVAN 320 MG TABS (VALSARTAN) one daily  #90 x 6   Entered and Authorized by:   Marletta Lor  MD   Signed by:   Marletta Lor  MD on 07/24/2009    Method used:   Electronically to        [facility] (mail-[place])       [place]  [address]       Ph: [phone]       Fax: [phone]   RxID:   [id number] METROGEL 1 % GEL (METRONIDAZOLE) use  twice daily  #45 gm x 4   Entered and Authorized by:   Marletta Lor  MD   Signed by:   Marletta Lor  MD on 07/24/2009   Method used:   Electronically to        [facility] (mail-[place]  [address]       Ph: [phone]       Fax: [phone]   [id number] VIAGRA 50 MG TABS (SILDENAFIL CITRATE) as directed  #12 x 6   Entered and Authorized by:   Marletta Lor  MD   Signed by:   Marletta Lor  MD on 07/24/2009   Method used:   Electronically to        [facility] ([place]  [address]       Ph: [phone]       Fax: [phone]   [id number] DOXAZOSIN MESYLATE 4 MG  TABS (DOXAZOSIN MESYLATE) 1 once daily  #90 x 6   Entered and Authorized by:   Marletta Lor  MD   Signed by:   Marletta Lor  MD on 07/24/2009   Method used:   Electronically to        [facility] (mail-[place])       [place]  [address]       Ph: [phone]       Fax: [phone]   [id number] METFORMIN  ER HCL 500 MG  TABS (METFORMIN HCL) 2 am, 2 at bedtime  #360 x 6   Entered and Authorized by:   Marletta Lor  MD   Signed by:   Marletta Lor  MD on 07/24/2009   Method used:   Print then Give to Patient   RxID:   [id number] FEXOFENADINE HCL 180 MG  TABS (FEXOFENADINE HCL) 1 once daily  #90 x 6   Entered and Authorized by:   Marletta Lor  MD   Signed by:   Marletta Lor  MD on 07/24/2009   Method used:   Electronically to        [facility] ([place])       [place]  [address]       Ph: [phone]       Fax: [phone]   [id number] SIMVASTATIN 40 MG  TABS (SIMVASTATIN) 1 once daily  #90 x 6   Entered and Authorized by:   Marletta Lor  MD   Signed by:   Marletta Lor  MD on 07/24/2009   Method used:   Electronically to        [facility] (mail-order)       [address] [place]  [address]  Ph: [phone]       Fax: [phone]   [id number]

## 2010-05-05 NOTE — Assessment & Plan Note (Signed)
Summary: 4 MONTH ROA/JLS/res per pt/jnl//res per dr/jnl   Vital Signs:  Patient Profile:   70 Years Old Male Height:     68 inches Weight:      209 pounds BMI:     31.89 Temp:     98.1 degrees F oral BP sitting:   146 / 88  (left arm)  Vitals Entered By: Chipper Oman, RN (November 09, 2006 8:20 AM)               Chief Complaint:  ROV. FBS @home  125.[name] Kitchen  History of Present Illness: 70 year old male follow-up of diabetes, hypertension, hypercholesterolemia, doing quite well continues to lose weight . is monitor blood sugars and blood pressure at home with the results  Current Allergies: ! SULF-10 (SULFACETAMIDE SODIUM)  Past Medical History:    Diabetes mellitus, type II    Hyperlipidemia    Hypertension    Allergic rhinitis    colonic polyps    kidney stones    diverticulosis   Family History:    father died at 32 of lung cancer.  Also had prostate cancer    mother died at 25.  Hypertension, multiple myeloma    one brother living and well     Physical Exam  General:     overweight-appearing.  130/82 Eyes:     No corneal or conjunctival inflammation noted. EOMI. Perrla. Funduscopic exam benign, without hemorrhages, exudates or papilledema. Vision grossly normal. Mouth:     Oral mucosa and oropharynx without lesions or exudates.  Teeth in good repair. Neck:     No deformities, masses, or tenderness noted. Lungs:     Normal respiratory effort, chest expands symmetrically. Lungs are clear to auscultation, no crackles or wheezes. Heart:     Normal rate and regular rhythm. S1 and S2 normal without gallop, murmur, click, rub or other extra sounds. Abdomen:     Bowel sounds positive,abdomen soft and non-tender without masses, organomegaly or hernias noted. Msk:     No deformity or scoliosis noted of thoracic or lumbar spine.   Pulses:     R and L carotid,radial,femoral,dorsalis pedis and posterior tibial pulses are full and equal bilaterally Extremities:     No  clubbing, cyanosis, edema, or deformity noted with normal full range of motion of all joints.      Impression & Recommendations:  Problem # 1:  DIABETES MELLITUS, TYPE II (ICD-250.00)  His updated medication list for this problem includes:    Diovan 320 Mg Tabs (Valsartan) .[name] Kitchen... 1 once daily    Metformin Hcl 500 Mg Tabs (Metformin hcl) .[name] Kitchen... 1 am, 2 at bedtime  Orders: TLB-A1C / Hgb A1C (Glycohemoglobin) (83036-A1C) Venipuncture HR:[id number])   Problem # 2:  HYPERLIPIDEMIA (ICD-272.4)  His updated medication list for this problem includes:    Simvastatin 40 Mg Tabs (Simvastatin) .[name] Kitchen... 1 once daily   Problem # 3:  HYPERTENSION (ICD-401.9)  His updated medication list for this problem includes:    Diovan 320 Mg Tabs (Valsartan) .[name] Kitchen... 1 once daily    Doxazosin Mesylate 4 Mg Tabs (Doxazosin mesylate) .[name] Kitchen... 1 once daily   Complete Medication List: 1)  Diovan 320 Mg Tabs (Valsartan) .[name] Kitchen.. 1 once daily 2)  Simvastatin 40 Mg Tabs (Simvastatin) .[name] Kitchen.. 1 once daily 3)  Fexofenadine Hcl 180 Mg Tabs (Fexofenadine hcl) .[name] Kitchen.. 1 once daily 4)  Metformin Hcl 500 Mg Tabs (Metformin hcl) .[name] Kitchen.. 1 am, 2 at bedtime 5)  Doxazosin Mesylate 4 Mg Tabs (Doxazosin mesylate) .[name] KitchenMarland KitchenMarland Kitchen 1  once daily   Patient Instructions: 1)  Please schedule a follow-up appointment in 4 months for CPX 2)  Advised not to eat any food or drink any liquids after 10 PM the night before your procedure. 3)  Limit your Sodium (Salt) to less than 4 grams a day (slightly less than 1 teaspoon) to prevent fluid retention, swelling, or worsening or symptoms. 4)  It is important that you exercise regularly at least 20 minutes 5 times a week. If you develop chest pain, have severe difficulty breathing, or feel very tired , stop exercising immediately and seek medical attention. 5)  You need to lose weight. Consider a lower calorie diet and regular exercise.  6)  See your eye doctor yearly to check for diabetic eye damage.

## 2010-05-05 NOTE — Assessment & Plan Note (Signed)
Summary: Cardiology Nuclear Testing  Nuclear Med Background Indications for Stress Test: Evaluation for Ischemia   History: Abnormal EKG, Asthma, Myocardial Perfusion Study  History Comments: '96 [id number]  Symptoms: Fatigue    Nuclear Pre-Procedure Cardiac Risk Factors: History of Smoking, Hypertension, Lipids, NIDDM, Obesity Caffeine/Decaff Intake: none NPO After: 8:00 AM Lungs: clear IV 0.9% NS with Angio Cath: 22g     IV Site: (R) AC IV Started by: Irven Baltimore RN Chest Size (in) 44-46     Height (in): 67.75 Weight (lb): 204 BMI: 31.36  Nuclear Med Study 1 or 2 day study:  1 day     Stress Test Type:  Stress Reading MD:  Dorris Carnes, MD     Referring MD:  Bluford Kaufmann, MD Resting Radionuclide:  Technetium 65m Tetrofosmin     Resting Radionuclide Dose:  9.0 mCi  Stress Radionuclide:  Technetium 69m Tetrofosmin     Stress Radionuclide Dose:  30.0 mCi   Stress Protocol Exercise Time (min):  7:00 min     Max HR:  123 bpm     Predicted Max HR: 0000000 bpm  Max Systolic BP: 0000000 mm Hg     % Max HR:  80 %     METS: 7.7 Rate Pressure Product:  21648    Stress Test Technologist:  Valetta Fuller CMA-N     Nuclear Technologist:  Vedia Pereyra CNMT  Rest Procedure  Myocardial perfusion imaging was performed at rest 45 minutes following the intraveneous administration of Myoview Technetium 41m Tetrofosmin.  Stress Procedure  The patient exercised for seven minutes.  The patient stopped due to significant dyspnea, diaphoresis and ashy color.  His O2 SATS were 98%.  He denied any chest pain.  There were nonspecific ST-T wave changes and occasional PAC's/PVC's.  Myoview was injected at peak exercise and myocardial perfusion imaging was performed after a brief delay.  QPS Raw Data Images:  Images were motion corrected.  Soft tissue (diaphragm) underlies heart. Stress Images:  Defect in the inferolateral apex.  Minimal thinning in the base/mid  inferior wall.  Otherwise nromal  perfusion. Rest Images:  Incomplet improvement in the apex. Subtraction (SDS):  No evidence of ischemia. Transient Ischemic Dilatation:  .90  (Normal <1.22)  Lung/Heart Ratio:  .34  (Normal <0.45)  Quantitative Gated Spect Images QGS EDV:  117 ml QGS ESV:  44 ml QGS EF:  62 %   Overall Impression  Exercise Capacity: Exercised 7 min with HR only achieving 123 (80% max), SBP 172.  stoped due to fatigue BP Response: Normal blood pressure response. Clinical Symptoms: Dyspnea.  Clammy immediately post exercise. ECG Impression: Motion artifact.  1 mm ST depression in infeor  (II, III) and lateral (V4-V6)leads.    Overall Impression: Electrically positive.  Note blunted heart rate.  Myoview scan with probable soft tissue attenuation and trivial apical ischemia (for heart rate achieved)  Appended Document: Cardiology Nuclear Testing place notified that stress test low risk for ischemic heart disease

## 2010-05-05 NOTE — Assessment & Plan Note (Signed)
Summary: 4 month rov/njr   Vital Signs:  Patient Profile:   70 Years Old Male Height:     68 inches Weight:      211 pounds Temp:     98.4 degrees F oral BP sitting:   130 / 64  (left arm) Cuff size:   regular  Vitals Entered By: Chipper Oman, RN (November 10, 2007 9:19 AM)                 Chief Complaint:  ROV and BS's 120.[name] Kitchen  History of Present Illness: 70 year old gentleman seen today for follow-up of his hypertension, dyslipidemia, and type 2 diabetes. doing well.  No cardiopulmonary complaints.  Last hemoglobin A1c6.7    Current Allergies: ! SULF-10 (SULFACETAMIDE SODIUM)  Past Medical History:    Reviewed history from 11/16/2006 and no changes required:       Diabetes mellitus, type II       Hyperlipidemia       Hypertension       Allergic rhinitis       colonic polyps       kidney stones       diverticulosis       dyslipidemia       High Cholesterol       Asthma       Diverticulitis, hx of     Review of Systems  The patient denies anorexia, fever, weight loss, weight gain, vision loss, decreased hearing, hoarseness, chest pain, syncope, dyspnea on exertion, peripheral edema, prolonged cough, headaches, hemoptysis, abdominal pain, melena, hematochezia, severe indigestion/heartburn, hematuria, incontinence, genital sores, muscle weakness, suspicious skin lesions, transient blindness, difficulty walking, depression, unusual weight change, abnormal bleeding, enlarged lymph nodes, angioedema, breast masses, and testicular masses.     Physical Exam  General:     overweight-appearing.   Head:     Normocephalic and atraumatic without obvious abnormalities. No apparent alopecia or balding. Eyes:     No corneal or conjunctival inflammation noted. EOMI. Perrla. Funduscopic exam benign, without hemorrhages, exudates or papilledema. Vision grossly normal. Mouth:     Oral mucosa and oropharynx without lesions or exudates.  Teeth in good repair. Neck:     No  deformities, masses, or tenderness noted. Lungs:     Normal respiratory effort, chest expands symmetrically. Lungs are clear to auscultation, no crackles or wheezes. Heart:     Normal rate and regular rhythm. S1 and S2 normal without gallop, murmur, click, rub or other extra sounds. Abdomen:     Bowel sounds positive,abdomen soft and non-tender without masses, organomegaly or hernias noted. Msk:     No deformity or scoliosis noted of thoracic or lumbar spine.      Impression & Recommendations:  Problem # 1:  HYPERTENSION (ICD-401.9)  His updated medication list for this problem includes:    Diovan 320 Mg Tabs (Valsartan) .[name] Kitchen... 1 once daily    Doxazosin Mesylate 4 Mg Tabs (Doxazosin mesylate) .[name] Kitchen... 1 once daily    Hydrochlorothiazide 25 Mg Tabs (Hydrochlorothiazide) .[name] Kitchen... 1 once daily    Diovan Hct 320-25 Mg Tabs (Valsartan-hydrochlorothiazide) ..... One daily   Problem # 2:  HYPERLIPIDEMIA (ICD-272.4)  His updated medication list for this problem includes:    Simvastatin 40 Mg Tabs (Simvastatin) .[name] Kitchen... 1 once daily   Problem # 3:  DIABETES MELLITUS, TYPE II (ICD-250.00)  His updated medication list for this problem includes:    Diovan 320 Mg Tabs (Valsartan) .[name] Kitchen... 1 once daily    Adult Aspirin  Ec Low Strength 81 Mg Tbec (Aspirin)    Diovan Hct 320-25 Mg Tabs (Valsartan-hydrochlorothiazide) ..... One daily  Orders: Venipuncture [id number]) TLB-A1C / Hgb A1C (Glycohemoglobin) (83036-A1C)   Complete Medication List: 1)  Diovan 320 Mg Tabs (Valsartan) .[name] Kitchen.. 1 once daily 2)  Simvastatin 40 Mg Tabs (Simvastatin) .[name] Kitchen.. 1 once daily 3)  Fexofenadine Hcl 180 Mg Tabs (Fexofenadine hcl) .[name] Kitchen.. 1 once daily 4)  Metformin Er Hcl 500 Mg Tabs (metformin Hcl)  .[name] Kitchen.. 1 am, 2 at bedtime 5)  Doxazosin Mesylate 4 Mg Tabs (Doxazosin mesylate) .[name] Kitchen.. 1 once daily 6)  Adult Aspirin Ec Low Strength 81 Mg Tbec (Aspirin) 7)  Hydrochlorothiazide 25 Mg Tabs (Hydrochlorothiazide) .[name] Kitchen.. 1 once daily 8)  Diovan  Hct 320-25 Mg Tabs (Valsartan-hydrochlorothiazide) .... One daily   Patient Instructions: 1)  Please schedule a follow-up appointment in 4 months. 2)  Advised not to eat any food or drink any liquids after 10 PM the night before your procedure. 3)  Limit your Sodium (Salt) to less than 2 grams a day(slightly less than 1/2 a teaspoon) to prevent fluid retention, swelling, or worsening of symptoms. 4)  It is important that you exercise regularly at least 20 minutes 5 times a week. If you develop chest pain, have severe difficulty breathing, or feel very tired , stop exercising immediately and seek medical attention. 5)  Check your blood sugars regularly. If your readings are usually above : or below 70 you should contact our office. 6)  It is important that your Diabetic A1c level is checked every 3 months.   Prescriptions: DIOVAN HCT 320-25 MG  TABS (VALSARTAN-HYDROCHLOROTHIAZIDE) one daily  #90 x 6   Entered and Authorized by:   Marletta Lor  MD   Signed by:   Marletta Lor  MD on 11/10/2007   Method used:   Print then Give to Patient   RxID:   [id number]  ]

## 2010-05-05 NOTE — Progress Notes (Signed)
       Additional Follow-up for Phone Call Additional follow up Details #2::     CALL PLACED TO PT RE INSTRUCTIONS PRIOR TO CARDIAC CTA.  HOLD METFORMIN NIGHT BEFORE CTA AND AM OF PROCEDURE HOLD HCTZ AND START TOPROL 50 MG 1 TAB PM BEFORE TEST AND 1 TAB AM OF TEST  PT VERBALIZED Andreas Newport, LPN  June  7, 624THL [id number] PM    New/Updated Medications: METOPROLOL TARTRATE 50 MG TABS (METOPROLOL TARTRATE) 1 tab night before cta and 1 tab am of cta   Prescriptions: METOPROLOL TARTRATE 50 MG TABS (METOPROLOL TARTRATE) 1 tab night before cta and 1 tab am of cta  #2 x 0   Entered by:   Devra Dopp, LPN   Authorized by:   Bosie Clos, MD, [facility]   Signed by:   Devra Dopp, LPN on [id number]   Method used:   Electronically to        [facility] (retail)       [address], [place]  [id number]       Ph: [phone] or [phone]       Fax: [phone]   RxID:   [id number]

## 2010-05-07 NOTE — Progress Notes (Signed)
Summary: new pharmacy  Phone Note Call from Patient   Summary of Call: [facility] Initial call taken by: Westley Hummer CMA Deborra Medina),  April 07, 2010 12:16 PM    Prescriptions: DIOVAN HCT 320-12.5 MG TABS (VALSARTAN-HYDROCHLOROTHIAZIDE) one daily  #90 x 6   Entered by:   Westley Hummer CMA ([place])   Authorized by:   Marletta Lor  MD   Signed by:   Westley Hummer CMA ([place]) on 04/07/2010   Method used:   Electronically to        [facility] (mail-order)       [place]  [address]       Ph: [phone]       Fax: [phone]   RxIDCQ:[id number]

## 2010-05-07 NOTE — Assessment & Plan Note (Signed)
Summary: 4 month fup//ccm   Vital Signs:  Bell profile:   70 year old male Weight:      207 pounds Temp:     98.1 degrees F oral BP sitting:   140 / 72  (left arm) Cuff size:   regular  Vitals Entered By: Cay Schillings LPN (December 15, 624THL 8:28 AM) CC: 4 mos rov - doing well     fbs 125 Is Bell Diabetic? Yes Did you bring your meter with you today? No   Primary Care Provider:  Marletta Lor  MD  CC:  4 mos rov - doing well     fbs 125.  History of Present Illness:  Ralph Bell who is seen today for follow-up of hypertension, dyslipidemia, and type 2 diabetes.  Systolic readings have trended up since tapering and discontinuation of hydrochlorothiazide.  He maintains very nice glycemic control remains on simvastatin 40 which he tolerates well.  he denies any cardiopulmonary complaints.  Weight is been unchanged  Allergies: 1)  ! Sulf-10  Past History:  Past Medical History: Reviewed history from 03/07/2008 and no changes required. Diabetes mellitus, type II Hyperlipidemia Hypertension Allergic rhinitis colonic polyps kidney stones diverticulosis dyslipidemia High Cholesterol Asthma Diverticulitis, hx of rosace ED  Past Surgical History: Reviewed history from 03/14/2007 and no changes required. Denies surgical history colonoscopy March 2008  Family History: Reviewed history from 07/24/2009 and no changes required. father died at 36 of lung cancer.  Also had prostate cancer mother died at 20.  Hypertension, multiple myeloma one sister living and well;   Review of Systems  The Bell denies anorexia, fever, weight loss, weight gain, vision loss, decreased hearing, hoarseness, chest pain, syncope, dyspnea on exertion, peripheral edema, prolonged cough, headaches, hemoptysis, abdominal pain, melena, hematochezia, severe indigestion/heartburn, hematuria, incontinence, genital sores, muscle weakness, suspicious skin lesions, transient  blindness, difficulty walking, depression, unusual weight change, abnormal bleeding, enlarged lymph nodes, angioedema, breast masses, and testicular masses.    Physical Exam  General:  overweight-appearing.  130 to 123XX123 to 80 diastolicoverweight-appearing.   Head:  Normocephalic and atraumatic without obvious abnormalities. No apparent alopecia or balding. Mouth:  Oral mucosa and oropharynx without lesions or exudates.  Teeth in good repair. Neck:  No deformities, masses, or tenderness noted. Heart:  Normal rate and regular rhythm. S1 and S2 normal without gallop, murmur, click, rub or other extra sounds. Abdomen:  Bowel sounds positive,abdomen soft and non-tender without masses, organomegaly or hernias noted.   Impression & Recommendations:  Problem # 1:  HYPERTENSION (ICD-401.9)  His updated medication list for this problem includes:    Doxazosin Mesylate 4 Mg Tabs (Doxazosin mesylate) .[name] Kitchen... 1 once daily    Diovan 320 Mg Tabs (Valsartan) ..... One daily    Diovan Hct 320-12.5 Mg Tabs (Valsartan-hydrochlorothiazide) ..... One daily    Hydrochlorothiazide 12.5 Mg Caps (Hydrochlorothiazide) ..... One daily  His updated medication list for this problem includes:    Doxazosin Mesylate 4 Mg Tabs (Doxazosin mesylate) .[name] Kitchen... 1 once daily    Diovan 320 Mg Tabs (Valsartan) ..... One daily    Diovan Hct 320-12.5 Mg Tabs (Valsartan-hydrochlorothiazide) ..... One daily    Hydrochlorothiazide 12.5 Mg Caps (Hydrochlorothiazide) ..... One daily  Problem # 2:  DIABETES MELLITUS, TYPE II (ICD-250.00)  His updated medication list for this problem includes:    Adult Aspirin Ec Low Strength 81 Mg Tbec (Aspirin)    Diovan 320 Mg Tabs (Valsartan) ..... One daily    Diovan Hct 320-12.5 Mg Tabs (  Valsartan-hydrochlorothiazide) ..... One daily    His updated medication list for this problem includes:    Adult Aspirin Ec Low Strength 81 Mg Tbec (Aspirin)    Diovan 320 Mg Tabs (Valsartan) ..... One  daily    Diovan Hct 320-12.5 Mg Tabs (Valsartan-hydrochlorothiazide) ..... One daily  Orders: Venipuncture [id number]) Venipuncture [id number]) TLB-A1C / Hgb A1C (Glycohemoglobin) (83036-A1C) Specimen Handling (99000)  Problem # 3:  HYPERLIPIDEMIA (ICD-272.4)  His updated medication list for this problem includes:    Simvastatin 40 Mg Tabs (Simvastatin) .[name] Kitchen... 1 once daily  His updated medication list for this problem includes:    Simvastatin 40 Mg Tabs (Simvastatin) .[name] Kitchen... 1 once daily  Complete Medication List: 1)  Simvastatin 40 Mg Tabs (Simvastatin) .[name] Kitchen.. 1 once daily 2)  Fexofenadine Hcl 180 Mg Tabs (Fexofenadine hcl) .[name] Kitchen.. 1 once daily 3)  Metformin Er Hcl 500 Mg Tabs (metformin Hcl)  .... 2 am, 2 at bedtime 4)  Doxazosin Mesylate 4 Mg Tabs (Doxazosin mesylate) .[name] Kitchen.. 1 once daily 5)  Adult Aspirin Ec Low Strength 81 Mg Tbec (Aspirin) 6)  Viagra 50 Mg Tabs (Sildenafil citrate) .... As directed 7)  Metrogel 1 % Gel (Metronidazole) .... Use  twice daily 8)  Diovan 320 Mg Tabs (Valsartan) .... One daily 9)  Accu-chek Aviva Strp (Glucose blood) .... Use daily as directed 10)  Diovan Hct 320-12.5 Mg Tabs (Valsartan-hydrochlorothiazide) .... One daily 11)  Hydrochlorothiazide 12.5 Mg Caps (Hydrochlorothiazide) .... One daily  Other Orders: Flu Vaccine 62yrs + [id number]) Admin 1st Vaccine [phone])  Bell Instructions: 1)  Please schedule a follow-up appointment in 4 months for CPX  2)  Advised not to eat any food or drink any liquids after 10 PM the night before your procedure. 3)  Limit your Sodium (Salt) to less than 2 grams a day(slightly less than 1/2 a teaspoon) to prevent fluid retention, swelling, or worsening of symptoms. 4)  It is important that you exercise regularly at least 20 minutes 5 times a week. If you develop chest pain, have severe difficulty breathing, or feel very tired , stop exercising immediately and seek medical attention. 5)  You need to lose weight. Consider a lower  calorie diet and regular exercise.  6)  Check your blood sugars regularly. If your readings are usually above : or below 70 you should contact our office. 7)  It is important that your Diabetic A1c level is checked every 3 months. 8)  See your eye doctor yearly to check for diabetic eye damage. Prescriptions: HYDROCHLOROTHIAZIDE 12.5 MG CAPS (HYDROCHLOROTHIAZIDE) one daily  #90 x 6   Entered and Authorized by:   Marletta Lor  MD   Signed by:   Marletta Lor  MD on 03/19/2010   Method used:   Electronically to        [facility]  [phone]* (retail)       [address]       [place], [place]  [address]       Ph: [phone] or [phone]       Fax: [phone]   RxID:   [id number] DIOVAN 320 MG TABS (VALSARTAN) one daily  #90 x 6   Entered and Authorized by:   Marletta Lor  MD   Signed by:   Marletta Lor  MD on 03/19/2010   Method used:   Electronically to        [facility]  [phone]* (retail)  [place], [place]  [address]       Ph: [phone] or [phone]       Fax: [phone]   RxID:   [id number] HYDROCHLOROTHIAZIDE 12.5 MG CAPS (HYDROCHLOROTHIAZIDE) one daily  #90 x 6   Entered and Authorized by:   Marletta Lor  MD   Signed by:   Marletta Lor  MD on 03/19/2010   Method used:   Electronically to        [facility]  [phone]* (retail)       [address]       [place], [place]  [address]       Ph: [phone] or [phone]       Fax: [phone]   RxID:   [id number] DIOVAN HCT 320-12.5 MG TABS (VALSARTAN-HYDROCHLOROTHIAZIDE) one daily  #90 x 6   Entered and Authorized by:   Marletta Lor  MD   Signed by:   Marletta Lor  MD on 03/19/2010   Method used:   Electronically to        [facility]  [phone]* (retail)       [address]       [place], [place]  [address]       Ph: [phone] or [phone]       Fax: [phone]   RxIDUQ:[id number] DIOVAN 320 MG TABS (VALSARTAN) one daily  #90 x 6   Entered and Authorized by:   Marletta Lor  MD   Signed by:   Marletta Lor  MD on 03/19/2010   Method used:   Electronically to        [facility]  [phone]* (retail)       [address]       [place], [place]  [address]       Ph: [phone] or [phone]       Fax: [phone]   RxID:   [id number]    Orders Added: 1)  Flu Vaccine 12yrs + [id number] 2)  Admin 1st Vaccine [id number])  Est. Bell Level III [id number] 4)  Venipuncture [id number] 5)  Venipuncture [id number])  TLB-A1C / Hgb A1C (Glycohemoglobin) [83036-A1C] 7)  Specimen Handling [99000]   Immunizations Administered:  Influenza Vaccine # 1:    Vaccine Type: Fluvax 3+    Site: left deltoid    Mfr: GlaxoSmithKline    Dose: 0.5 ml    Route: IM    Given by: Cay Schillings LPN    Exp. Date: 10/03/2010    Lot #: [id number]    VIS given: 10/28/09 version given March 19, 2010.    Physician counseled: yes  Flu Vaccine Consent Questions:    Do you have a history of severe allergic reactions to this vaccine? no    Any prior history of allergic reactions to egg and/or gelatin? no    Do you have a sensitivity to the preservative Thimersol? no    Do you have a past history of Guillan-Barre Syndrome? no    Do you currently have an acute febrile illness? no    Have you ever had a severe reaction to latex? no    Vaccine information given and explained to Bell? yes   Immunizations Administered:  Influenza Vaccine # 1:    Vaccine Type: Fluvax 3+    Site: left deltoid    Mfr: GlaxoSmithKline    Dose: 0.5 ml    Route: IM    Given by: Cay Schillings LPN    Exp. Date: 10/03/2010    Lot #: [id number]    VIS given: 10/28/09 version given March 19, 2010.    Physician counseled: yes

## 2010-07-06 ENCOUNTER — Encounter: Payer: Self-pay | Admitting: Internal Medicine

## 2010-07-16 ENCOUNTER — Other Ambulatory Visit (INDEPENDENT_AMBULATORY_CARE_PROVIDER_SITE_OTHER): Payer: Medicare Other | Admitting: Internal Medicine

## 2010-07-16 DIAGNOSIS — I10 Essential (primary) hypertension: Secondary | ICD-10-CM

## 2010-07-16 DIAGNOSIS — E119 Type 2 diabetes mellitus without complications: Secondary | ICD-10-CM

## 2010-07-16 DIAGNOSIS — Z125 Encounter for screening for malignant neoplasm of prostate: Secondary | ICD-10-CM

## 2010-07-16 DIAGNOSIS — E785 Hyperlipidemia, unspecified: Secondary | ICD-10-CM

## 2010-07-16 DIAGNOSIS — Z0000 Encounter for general adult medical examination without abnormal findings: Secondary | ICD-10-CM

## 2010-07-16 LAB — POCT URINALYSIS DIPSTICK
Glucose, UA: NEGATIVE
Leukocytes, UA: NEGATIVE
Nitrite, UA: NEGATIVE
Spec Grav, UA: 1.02
Urobilinogen, UA: 0.2
pH, UA: 5.5

## 2010-07-16 LAB — HEPATIC FUNCTION PANEL
ALT: 29 U/L (ref 0–53)
AST: 27 U/L (ref 0–37)
Albumin: 4 g/dL (ref 3.5–5.2)
Alkaline Phosphatase: 60 U/L (ref 39–117)
Bilirubin, Direct: 0.2 mg/dL (ref 0.0–0.3)
Total Bilirubin: 0.7 mg/dL (ref 0.3–1.2)
Total Protein: 7.5 g/dL (ref 6.0–8.3)

## 2010-07-16 LAB — BASIC METABOLIC PANEL
BUN: 39 mg/dL — ABNORMAL HIGH (ref 6–23)
CO2: 26 mEq/L (ref 19–32)
Calcium: 9.8 mg/dL (ref 8.4–10.5)
Chloride: 103 mEq/L (ref 96–112)
Creatinine, Ser: 1.4 mg/dL (ref 0.4–1.5)
GFR: 53.83 mL/min — ABNORMAL LOW (ref >60.00)
Glucose, Bld: 140 mg/dL — ABNORMAL HIGH (ref 70–99)
Potassium: 5.3 mEq/L — ABNORMAL HIGH (ref 3.5–5.1)
Sodium: 139 mEq/L (ref 135–145)

## 2010-07-16 LAB — CBC WITH DIFFERENTIAL/PLATELET
Basophils Absolute: 0 10*3/uL (ref 0.0–0.1)
Basophils Relative: 0.6 % (ref 0.0–3.0)
Eosinophils Absolute: 0.2 10*3/uL (ref 0.0–0.7)
Eosinophils Relative: 2.7 % (ref 0.0–5.0)
HCT: 41 % (ref 39.0–52.0)
Hemoglobin: 13.9 g/dL (ref 13.0–17.0)
Lymphocytes Relative: 23.4 % (ref 12.0–46.0)
Lymphs Abs: 1.7 10*3/uL (ref 0.7–4.0)
MCHC: 34 g/dL (ref 30.0–36.0)
MCV: 87.2 fl (ref 78.0–100.0)
Monocytes Absolute: 0.5 10*3/uL (ref 0.1–1.0)
Monocytes Relative: 6.8 % (ref 3.0–12.0)
Neutro Abs: 4.8 10*3/uL (ref 1.4–7.7)
Neutrophils Relative %: 66.5 % (ref 43.0–77.0)
Platelets: 186 10*3/uL (ref 150.0–400.0)
RBC: 4.71 Mil/uL (ref 4.22–5.81)
RDW: 13.4 % (ref 11.5–14.6)
WBC: 7.2 10*3/uL (ref 4.5–10.5)

## 2010-07-16 LAB — LIPID PANEL
Cholesterol: 100 mg/dL (ref 0–200)
HDL: 35.9 mg/dL — ABNORMAL LOW (ref >39.00)
LDL Cholesterol: 47 mg/dL (ref 0–99)
Total CHOL/HDL Ratio: 3
Triglycerides: 85 mg/dL (ref 0.0–149.0)
VLDL: 17 mg/dL (ref 0.0–40.0)

## 2010-07-16 LAB — HEMOGLOBIN A1C: Hgb A1c MFr Bld: 7.4 % — ABNORMAL HIGH (ref 4.6–6.5)

## 2010-07-16 LAB — PSA: PSA: 4.87 ng/mL — ABNORMAL HIGH (ref 0.10–4.00)

## 2010-07-16 LAB — MICROALBUMIN / CREATININE URINE RATIO
Creatinine,U: 284.6 mg/dL
Microalb Creat Ratio: 2.1 mg/g (ref 0.0–30.0)
Microalb, Ur: 6.1 mg/dL — ABNORMAL HIGH (ref 0.0–1.9)

## 2010-07-16 LAB — TSH: TSH: 1.27 u[IU]/mL (ref 0.35–5.50)

## 2010-07-20 ENCOUNTER — Other Ambulatory Visit: Payer: Self-pay

## 2010-07-23 ENCOUNTER — Encounter: Payer: Self-pay | Admitting: Internal Medicine

## 2010-07-27 ENCOUNTER — Encounter: Payer: Self-pay | Admitting: Internal Medicine

## 2010-07-27 ENCOUNTER — Ambulatory Visit (INDEPENDENT_AMBULATORY_CARE_PROVIDER_SITE_OTHER): Payer: Medicare Other | Admitting: Internal Medicine

## 2010-07-27 VITALS — BP 110/70 | HR 60 | Temp 98.1°F | Resp 18 | Ht 68.5 in | Wt 207.0 lb

## 2010-07-27 DIAGNOSIS — E1139 Type 2 diabetes mellitus with other diabetic ophthalmic complication: Secondary | ICD-10-CM

## 2010-07-27 DIAGNOSIS — H4720 Unspecified optic atrophy: Secondary | ICD-10-CM

## 2010-07-27 DIAGNOSIS — I10 Essential (primary) hypertension: Secondary | ICD-10-CM

## 2010-07-27 DIAGNOSIS — R972 Elevated prostate specific antigen [PSA]: Secondary | ICD-10-CM

## 2010-07-27 DIAGNOSIS — E785 Hyperlipidemia, unspecified: Secondary | ICD-10-CM

## 2010-07-27 DIAGNOSIS — E1169 Type 2 diabetes mellitus with other specified complication: Secondary | ICD-10-CM

## 2010-07-27 DIAGNOSIS — E119 Type 2 diabetes mellitus without complications: Secondary | ICD-10-CM

## 2010-07-27 DIAGNOSIS — H9190 Unspecified hearing loss, unspecified ear: Secondary | ICD-10-CM

## 2010-07-27 LAB — GLUCOSE, POCT (MANUAL RESULT ENTRY): POC Glucose: 146

## 2010-07-27 NOTE — Patient Instructions (Signed)
Please check your hemoglobin A1c every 3 months  Please check your blood pressure on a regular basis.  If it is consistently greater than 150/90, please make an office appointment.    It is important that you exercise regularly, at least 20 minutes 3 to 4 times per week.  If you develop chest pain or shortness of breath seek  medical attention.

## 2010-07-27 NOTE — Progress Notes (Signed)
Subjective:    Patient ID: Ralph Bell, male    DOB: 1940/10/21, 70 y.o.   MRN: [id number]  HPI  70 year old patient who is seen today for a wellness exam. He has a history of type 2 diabetes which has been controlled on metformin therapy. He has dyslipidemia and hypertension. He denies any cardiopulmonary complaints. He has a history of screening colonoscopy in 2008.  1. Risk factors, based on past  M,S,F history- cardiovascular risk factors include diabetes hypertension and dyslipidemia  2.  Physical activities: No activity restrictions  3.  Depression/mood: No history of depression or mood disorder 4.  Hearing: No deficits  5.  ADL's: Independent in a [place] of daily living  6.  Fall risk: Low  7.  Home safety: No problems identified  8.  Height weight, and visual acuity; height and weight are stable no change in visual acuity  9.  Counseling: Follow up colonoscopy in 2013  10. Lab orders based on risk factors: Laboratory studies were reviewed  11. Referral: Not appropriate at this time has had a recent eye examination  12. Care plan:  The patient will have a repeat PSA performed in 3 months at the time of his next hemoglobin A1c 13. Cognitive assessment: Alert and oriented with normal affect       Review of Systems  Constitutional: Negative for fever, chills, activity change, appetite change and fatigue.  HENT: Negative for hearing loss, ear pain, congestion, rhinorrhea, sneezing, mouth sores, trouble swallowing, neck pain, neck stiffness, dental problem, voice change, sinus pressure and tinnitus.   Eyes: Negative for photophobia, pain, redness and visual disturbance.  Respiratory: Negative for apnea, cough, choking, chest tightness, shortness of breath and wheezing.   Cardiovascular: Negative for chest pain, palpitations and leg swelling.  Gastrointestinal: Negative for nausea, vomiting, abdominal pain, diarrhea, constipation, blood in stool, abdominal distention, anal  bleeding and rectal pain.  Genitourinary: Negative for dysuria, urgency, frequency, hematuria, flank pain, decreased urine volume, discharge, penile swelling, scrotal swelling, difficulty urinating, genital sores and testicular pain.  Musculoskeletal: Negative for myalgias, back pain, joint swelling, arthralgias and gait problem.  Skin: Negative for color change, rash and wound.  Neurological: Negative for dizziness, tremors, seizures, syncope, facial asymmetry, speech difficulty, weakness, light-headedness, numbness and headaches.  Hematological: Negative for adenopathy. Does not bruise/bleed easily.  Psychiatric/Behavioral: Negative for suicidal ideas, hallucinations, behavioral problems, confusion, sleep disturbance, self-injury, dysphoric mood, decreased concentration and agitation. The patient is not nervous/anxious.        Objective:   Physical Exam  Constitutional: He appears well-developed and well-nourished.  HENT:  Head: Normocephalic and atraumatic.  Right Ear: External ear normal.  Left Ear: External ear normal.  Nose: Nose normal.  Mouth/Throat: Oropharynx is clear and moist.  Eyes: Conjunctivae and EOM are normal. Pupils are equal, round, and reactive to light. No scleral icterus.  Neck: Normal range of motion. Neck supple. No JVD present. No thyromegaly present.  Cardiovascular: Regular rhythm, normal heart sounds and intact distal pulses.  Exam reveals no gallop and no friction rub.   No murmur heard. Pulmonary/Chest: Effort normal and breath sounds normal. He exhibits no tenderness.  Abdominal: Soft. Bowel sounds are normal. He exhibits no distension and no mass. There is no tenderness.  Genitourinary: Prostate normal and penis normal.  Musculoskeletal: Normal range of motion. He exhibits no edema and no tenderness.  Lymphadenopathy:    He has no cervical adenopathy.  Neurological: He is alert. He has normal reflexes. No cranial nerve  deficit. Coordination normal.    Skin: Skin is warm and dry. No rash noted.  Psychiatric: He has a normal mood and affect. His behavior is normal.          Assessment & Plan:  Annual exam Diabetes mellitus well controlled Elevated PSA. We'll recheck in 3 months and consider urologic referral if still elevated Hypertension stable Dyslipidemia

## 2010-08-03 ENCOUNTER — Telehealth: Payer: Self-pay | Admitting: *Deleted

## 2010-08-03 MED ORDER — METFORMIN HCL ER 500 MG PO TB24: 1000.0000 mg | ORAL_TABLET | Freq: Two times a day (BID) | ORAL | Status: DC

## 2010-08-03 MED ORDER — SIMVASTATIN 40 MG PO TABS: 40.0000 mg | ORAL_TABLET | Freq: Every day | ORAL | Status: DC

## 2010-08-03 MED ORDER — FEXOFENADINE HCL 180 MG PO TABS: 180.0000 mg | ORAL_TABLET | Freq: Every day | ORAL | Status: DC

## 2010-08-03 MED ORDER — VALSARTAN-HYDROCHLOROTHIAZIDE 320-12.5 MG PO TABS: 1.0000 | ORAL_TABLET | Freq: Every day | ORAL | Status: DC

## 2010-08-03 MED ORDER — DOXAZOSIN MESYLATE 4 MG PO TABS: 4.0000 mg | ORAL_TABLET | Freq: Every day | ORAL | Status: DC

## 2010-08-03 NOTE — Telephone Encounter (Signed)
Pt calling to state that he needs all meds except metrogel and test strips sent to [facility].  This was supposed to have been done last week at [facility].  Pharmacy still has not received, please send in ASAP.

## 2010-08-03 NOTE — Telephone Encounter (Signed)
Pt states that his meds have not received by [facility], and he is leaving for [place] this month.  Please call him ASAP to get this straight.

## 2010-08-21 NOTE — Op Note (Signed)
NAME:  Ralph Bell, Ralph Bell                 ACCOUNT NO.:  [id number]   MEDICAL RECORD NO.:  [id number]          PATIENT TYPE:  AMB   LOCATION:  ENDO                         FACILITY:  [facility]   PHYSICIAN:  Nelwyn Salisbury, M.D.  DATE OF BIRTH:  1940-12-15   DATE OF PROCEDURE:  06/20/2006  DATE OF DISCHARGE:                               OPERATIVE REPORT   PROCEDURE PERFORMED:  Screening colonoscopy.   ENDOSCOPIST:  Nelwyn Salisbury, M.D.   INSTRUMENT USED:  Pentax video colonoscope.   INDICATIONS FOR PROCEDURE:  A 70 year old white male undergoing a  screening colonoscopy. The patient has a personal history of colonic  polyps removed in the past. Rule out recurrent polyps.   PREPROCEDURE PREPARATION:  Informed consent was procured from the  patient. The patient fasted for 8 hours prior to the procedure after  being prepped with a gallon of GoLYTELY the night prior to the  procedure. Risks and benefits of the procedure including a 10% miss rate  of cancer and polyps were discussed with the patient as well.   PREPROCEDURE PHYSICAL EXAMINATION:  VITAL SIGNS:  The patient had stable  vital signs.  NECK:  Supple.  CHEST:  Clear to auscultation.  CARDIOVASCULAR:  S1, S2 regular.  ABDOMEN:  Soft, with normal bowel sounds.   DESCRIPTION OF THE PROCEDURE:  The patient was placed in the left  lateral decubitus position and sedated with 100 mcg of Fentanyl and 10  mg of Versed given intravenously in slow incremental doses.  Once the  patient was adequately sedated and maintained on low-flow oxygen and  continuous cardiac monitoring, the Pentax video colonoscope was advanced  from the rectum to the cecum. The patient had a fairly good prep.  The  appendiceal orifice and the cecal valve were clearly visualized and  photographed. The terminal ileum appeared healthy, without lesions. A  couple of very early right-sided diverticula were noted. Small internal  hemorrhoids were seen on retroflexion. No  masses or polyps were  identified. The patient tolerated the procedure well, without immediate  complications   IMPRESSION:  1. Essentially normal colonoscopy of the terminal ileum, except for a      few early right sided diverticula and small internal hemorrhoids      seen on retroflexion.  2. No masses or polyps seen.   RECOMMENDATIONS:  1. Continue a high-fiber diet with liberal fluid intake.  2. Repeat colonoscopy in the next 5 years.  If the patient has any      abnormal symptoms in the interim, he is to contact the office      immediately for further recommendations.  3. Outpatient followup as need arises in the future.      Nelwyn Salisbury, M.D.  Electronically Signed     JNM/MEDQ  D:  06/20/2006  T:  06/20/2006  Job:  [id number]   cc:   Marletta Lor, MD

## 2010-08-21 NOTE — Op Note (Signed)
NAMEIRWIN, RUBIO                             ACCOUNT NO.:  [id number]   MEDICAL RECORD NO.:  [id number]                   PATIENT TYPE:  AMB   LOCATION:  ENDO                                 FACILITY:  [facility]   PHYSICIAN:  Nelwyn Salisbury, M.D.               DATE OF BIRTH:  02-07-1941   DATE OF PROCEDURE:  09/21/2002  DATE OF DISCHARGE:  09/21/2002                                 OPERATIVE REPORT   PROCEDURE:  Colonoscopy with snare polypectomy x1.   ENDOSCOPIST:  Juanita Craver, M.D.   INSTRUMENT USED:  Olympus video colonoscope.   INDICATIONS FOR PROCEDURE:  43 white male with a history of  AODM, undergoing screening colonoscopy to rule out colonic polyps, masses,  etc.   PROCEDURE. PERFORMED:  Informed consent was procured from the patient.  The  patient fasted for eight hours prior to the procedure and prepped with a  bottle of magnesium citrate and a gallon of GOLYTELY the night prior to the  procedure.   PREPROCEDURE PHYSICAL EXAMINATION:  VITAL SIGNS:  The patient had stable  vital signs.  NECK:  Supple.  CHEST:  Clear to auscultation.  HEART:  S1 and S2 regular.  ABDOMEN:  Soft with normal bowel sounds.   DESCRIPTION OF PROCEDURE:  The patient was placed in the left lateral  decubitus position, sedated with 70 mg of Demerol and 7 mg of Versed  intravenously.  Once the patient was adequately sedated and maintained on  low flow oxygen and continuous cardiac monitoring, the Olympus video  colonoscope was advanced from the rectum to the cecum without difficulty.  The patient had a fairly good prep.  An isolated diverticula were seen at 55  cm.  A small sessile polyp was snared from the mid right colon.  This was  lost in the scope.  The appendiceal orifice and ileocecal valve were clearly  visualized and photographed.  No other masses or polyps were seen.  Small  nonbleeding internal hemorrhoids were seen on retroflexion in the rectum.  The patient  tolerated the procedure well without complications.   IMPRESSION:  1. Small nonbleeding internal hemorrhoids.  2. Isolated diverticula at 65 cm.  3. Small sessile polyp snared from the mid right colon.    RECOMMENDATIONS:  1. Avoid nonsteroidals including aspirin for the next two to four weeks.  2. Repeat colonoscopy in the next two years unless the patient develops any     abnormal symptoms.  3. Outpatient followup on a p.r.n. basis.                                               Nelwyn Salisbury, M.D.    JNM/MEDQ  D:  09/21/2002  T:  09/23/2002  Job:  [id number]  cc:   Youlanda Roys. Deatra Ina, M.D.  [address]  [place]  [place] [address]  Fax: [phone]

## 2010-08-21 NOTE — Assessment & Plan Note (Signed)
[facility]                              [facility] OFFICE NOTE   NAME:Arzuaga, FRED                          MRN:          [id number]  DATE:02/07/2006                            DOB:          1940/12/08    The patient is a 70 year old gentleman was seen today to establish with our  practice.  He is a former patient of Dr. Marline Backbone, formerly treated by  Dr. Elson Clan who has recently retired.   PAST MEDICAL HISTORY:  1. Type 2 diabetes.  2. Hypertension.  3. Dyslipidemia.  4. Diverticulosis.  5. Colonic polyps.  6. Symptomatic renal stone disease in [place].  7. History of seasonal allergic rhinitis.   REVIEW OF SYSTEMS:  Fairly unremarkable.  There has been some weight gain,  and he is interested in a weight loss program.  His last colonoscopy was in  2004, and he is scheduled for a followup soon.   FAMILY HISTORY:  Father died at 70.  He had prostate cancer and died of lung  cancer.  Mother died at 56 with complications of multiple myeloma.  She had  hypertension.  One sister is well.   PHYSICAL EXAMINATION:  GENERAL:  Overweight male in no acute distress.  VITAL SIGNS:  Blood pressure 140/72.  HEENT:  Fundi, ears, nose, and throat clear.  NECK:  No bruits or adenopathy.  CHEST:  Clear.  CARDIOVASCULAR:  Normal heart sounds.  No murmurs.  ABDOMEN:  Obese, soft, nontender.  No organomegaly.  EXTREMITIES:  Negative, with full peripheral pulses.  NEUROLOGIC:  Negative and intact to monofilament testing.   IMPRESSION:  1. Diabetes.  2. Hypertension.  3. Dyslipidemia.   DISPOSITION:  Metformin 1 g daily in divided doses will be instituted and  Actos discontinued.  Otherwise, his medical regimen will be unchanged.  Laboratory update reviewed.  We will recheck in 3 months.    ______________________________  Marletta Lor, MD    PFK/MedQ  DD: 02/07/2006  DT: 02/08/2006  Job #: [id number]

## 2010-11-26 ENCOUNTER — Encounter: Payer: Self-pay | Admitting: Internal Medicine

## 2010-11-26 ENCOUNTER — Ambulatory Visit (INDEPENDENT_AMBULATORY_CARE_PROVIDER_SITE_OTHER): Payer: Medicare Other | Admitting: Internal Medicine

## 2010-11-26 DIAGNOSIS — E785 Hyperlipidemia, unspecified: Secondary | ICD-10-CM

## 2010-11-26 DIAGNOSIS — I10 Essential (primary) hypertension: Secondary | ICD-10-CM

## 2010-11-26 DIAGNOSIS — R972 Elevated prostate specific antigen [PSA]: Secondary | ICD-10-CM

## 2010-11-26 DIAGNOSIS — E119 Type 2 diabetes mellitus without complications: Secondary | ICD-10-CM

## 2010-11-26 LAB — PSA: PSA: 1.7 ng/mL (ref 0.10–4.00)

## 2010-11-26 LAB — HEMOGLOBIN A1C: Hgb A1c MFr Bld: 7 % — ABNORMAL HIGH (ref 4.6–6.5)

## 2010-11-26 LAB — GLUCOSE, POCT (MANUAL RESULT ENTRY): POC Glucose: 131

## 2010-11-26 MED ORDER — VALSARTAN-HYDROCHLOROTHIAZIDE 320-12.5 MG PO TABS: 1.0000 | ORAL_TABLET | Freq: Every day | ORAL | Status: DC

## 2010-11-26 NOTE — Patient Instructions (Signed)
It is important that you exercise regularly, at least 20 minutes 3 to 4 times per week.  If you develop chest pain or shortness of breath seek  medical attention.  You need to lose weight.  Consider a lower calorie diet and regular exercise.   Please check your hemoglobin A1c every 3 months   

## 2010-11-26 NOTE — Progress Notes (Signed)
  Subjective:    Patient ID: Ralph Bell, male    DOB: 15-Oct-1940, 70 y.o.   MRN: [id number]  HPI  70 year old patient who is seen today for followup of his type 2 diabetes. His last hemoglobin A1c had increased to 7.4. He is on metformin therapy only. Laboratory studies also revealed an elevated PSA. He is scheduled for repeat today. There's been no significant lifestyle changes. He feels well.    Review of Systems  Constitutional: Negative for fever, chills, appetite change and fatigue.  HENT: Negative for hearing loss, ear pain, congestion, sore throat, trouble swallowing, neck stiffness, dental problem, voice change and tinnitus.   Eyes: Negative for pain, discharge and visual disturbance.  Respiratory: Negative for cough, chest tightness, wheezing and stridor.   Cardiovascular: Negative for chest pain, palpitations and leg swelling.  Gastrointestinal: Negative for nausea, vomiting, abdominal pain, diarrhea, constipation, blood in stool and abdominal distention.  Genitourinary: Negative for urgency, hematuria, flank pain, discharge, difficulty urinating and genital sores.  Musculoskeletal: Negative for myalgias, back pain, joint swelling, arthralgias and gait problem.  Skin: Negative for rash.  Neurological: Negative for dizziness, syncope, speech difficulty, weakness, numbness and headaches.  Hematological: Negative for adenopathy. Does not bruise/bleed easily.  Psychiatric/Behavioral: Negative for behavioral problems and dysphoric mood. The patient is not nervous/anxious.        Objective:   Physical Exam  Constitutional: He is oriented to person, place, and time. He appears well-developed.  HENT:  Head: Normocephalic.  Right Ear: External ear normal.  Left Ear: External ear normal.  Eyes: Conjunctivae and EOM are normal.  Neck: Normal range of motion.  Cardiovascular: Normal rate and normal heart sounds.   Pulmonary/Chest: Breath sounds normal.  Abdominal: Bowel sounds are  normal.  Musculoskeletal: Normal range of motion. He exhibits no edema and no tenderness.  Neurological: He is alert and oriented to person, place, and time.  Psychiatric: He has a normal mood and affect. His behavior is normal.          Assessment & Plan:   Diabetes mellitus. Lifestyle issues discussed including more exercise and weight loss Hypertension well controlled Elevated PSA. We'll repeat today if still elevated will set up for urological evaluation

## 2010-11-26 NOTE — Progress Notes (Signed)
Quick Note:  Spoke with pt - informed of labs ______

## 2011-03-01 ENCOUNTER — Encounter: Payer: Self-pay | Admitting: Internal Medicine

## 2011-03-01 ENCOUNTER — Ambulatory Visit (INDEPENDENT_AMBULATORY_CARE_PROVIDER_SITE_OTHER): Payer: Medicare Other | Admitting: Internal Medicine

## 2011-03-01 VITALS — BP 122/80 | Temp 97.9°F | Wt 204.0 lb

## 2011-03-01 DIAGNOSIS — Z23 Encounter for immunization: Secondary | ICD-10-CM

## 2011-03-01 DIAGNOSIS — Z0000 Encounter for general adult medical examination without abnormal findings: Secondary | ICD-10-CM

## 2011-03-01 DIAGNOSIS — E119 Type 2 diabetes mellitus without complications: Secondary | ICD-10-CM

## 2011-03-01 DIAGNOSIS — I10 Essential (primary) hypertension: Secondary | ICD-10-CM

## 2011-03-01 LAB — HEMOGLOBIN A1C: Hgb A1c MFr Bld: 6.8 % — ABNORMAL HIGH (ref 4.6–6.5)

## 2011-03-01 MED ORDER — VALSARTAN-HYDROCHLOROTHIAZIDE 320-12.5 MG PO TABS: 1.0000 | ORAL_TABLET | Freq: Every day | ORAL | Status: DC

## 2011-03-01 NOTE — Patient Instructions (Signed)
Limit your sodium (Salt) intake   Please check your hemoglobin A1c every 3 months    It is important that you exercise regularly, at least 20 minutes 3 to 4 times per week.  If you develop chest pain or shortness of breath seek  medical attention.  You need to lose weight.  Consider a lower calorie diet and regular exercise. 

## 2011-03-01 NOTE — Progress Notes (Signed)
  Subjective:    Patient ID: Ralph Bell, male    DOB: 1940-07-17, 70 y.o.   MRN: [id number]  HPI  70 year old patient who is seen today for followup of his type 2 diabetes and hypertension. He has done quite well. He has had 3 episodes of postprandial nausea and vomiting. Symptoms seem to have improved. His last hemoglobin A1c 7.0. There has been some modest weight loss over the past several visits.    Review of Systems  Constitutional: Negative for fever, chills, appetite change and fatigue.  HENT: Negative for hearing loss, ear pain, congestion, sore throat, trouble swallowing, neck stiffness, dental problem, voice change and tinnitus.   Eyes: Negative for pain, discharge and visual disturbance.  Respiratory: Negative for cough, chest tightness, wheezing and stridor.   Cardiovascular: Negative for chest pain, palpitations and leg swelling.  Gastrointestinal: Positive for vomiting. Negative for nausea, abdominal pain, diarrhea, constipation, blood in stool and abdominal distention.       3 nocturnal episodes following large meals  Genitourinary: Negative for urgency, hematuria, flank pain, discharge, difficulty urinating and genital sores.  Musculoskeletal: Negative for myalgias, back pain, joint swelling, arthralgias and gait problem.  Skin: Negative for rash.  Neurological: Negative for dizziness, syncope, speech difficulty, weakness, numbness and headaches.  Hematological: Negative for adenopathy. Does not bruise/bleed easily.  Psychiatric/Behavioral: Negative for behavioral problems and dysphoric mood. The patient is not nervous/anxious.        Objective:   Physical Exam  Constitutional: He is oriented to person, place, and time. He appears well-developed.       118/76  HENT:  Head: Normocephalic.  Right Ear: External ear normal.  Left Ear: External ear normal.  Eyes: Conjunctivae and EOM are normal.  Neck: Normal range of motion.  Cardiovascular: Normal rate and normal  heart sounds.   Pulmonary/Chest: Breath sounds normal.  Abdominal: Bowel sounds are normal.  Musculoskeletal: Normal range of motion. He exhibits no edema and no tenderness.  Neurological: He is alert and oriented to person, place, and time.  Psychiatric: He has a normal mood and affect. His behavior is normal.          Assessment & Plan:   Hypertension well controlled. We'll continue present regimen Diabetes mellitus. We'll check a hemoglobin A1c. Weight loss exercise encouraged Sporadic  postprandial nausea and vomiting. We'll observe at this time

## 2011-03-01 NOTE — Progress Notes (Signed)
  Subjective:    Patient ID: Ralph Bell, male    DOB: 09/28/1940, 70 y.o.   MRN: [id number]  HPI  Wt Readings from Last 3 Encounters:  03/01/11 204 lb (92.534 kg)  11/26/10 206 lb (93.441 kg)  07/27/10 207 lb (93.895 kg)     Review of Systems     Objective:   Physical Exam        Assessment & Plan:

## 2011-04-27 ENCOUNTER — Encounter: Payer: Self-pay | Admitting: Internal Medicine

## 2011-04-27 ENCOUNTER — Ambulatory Visit (INDEPENDENT_AMBULATORY_CARE_PROVIDER_SITE_OTHER): Payer: Medicare Other | Admitting: Internal Medicine

## 2011-04-27 VITALS — BP 124/80 | HR 59 | Temp 97.8°F | Wt 212.0 lb

## 2011-04-27 DIAGNOSIS — M109 Gout, unspecified: Secondary | ICD-10-CM

## 2011-04-27 DIAGNOSIS — E119 Type 2 diabetes mellitus without complications: Secondary | ICD-10-CM

## 2011-04-27 MED ORDER — VALSARTAN 320 MG PO TABS: 320.0000 mg | ORAL_TABLET | Freq: Every day | ORAL | Status: DC

## 2011-04-27 NOTE — Patient Instructions (Signed)
Call or return to clinic prn if these symptoms worsen or fail to improve as anticipated.   Take Aleve 200 mg   TWO twice daily for pain or swelling

## 2011-04-27 NOTE — Progress Notes (Signed)
  Subjective:    Patient ID: Ralph Bell, male    DOB: 01-May-1940, 71 y.o.   MRN: [id number]  HPI  71 year old patient who experienced a hyperextension injury to the right knee approximately 3 or 4 months ago since that time he has had intermittent discomfort in the knee and over the past 5 days has had significant pain with swelling and redness. He has been using naproxen 400 mg twice daily with steady improvement. No prior history of gout. Antihypertensive regimen includes a thiazide diuretic.    Review of Systems  Musculoskeletal: Positive for joint swelling and gait problem.       Objective:   Physical Exam  Constitutional: He appears well-developed and well-nourished. No distress.  Musculoskeletal: He exhibits edema and tenderness.       Right knee with a mild effusion; knee was slightly warm to touch with mild erythema          Assessment & Plan:   Right knee effusion. Possible resolving gouty arthritis. Possible improving meniscal tear. Will continue anti-inflammatory medications and check a uric acid level. Hydrochlorothiazide will be discontinued. Depending on his clinical response may need orthopedic referral

## 2011-04-28 ENCOUNTER — Telehealth: Payer: Self-pay | Admitting: *Deleted

## 2011-04-28 LAB — URIC ACID: Uric Acid, Serum: 8.2 mg/dL — ABNORMAL HIGH (ref 4.0–7.8)

## 2011-04-28 NOTE — Telephone Encounter (Signed)
Pt is waiting for lab results,  He is in a lot of pain and knee is extremely swollen. Please call  ASAP.

## 2011-04-29 NOTE — Telephone Encounter (Signed)
Pt states the only medication he is taking Aleve 500 and does need more medication.  Pls advise.

## 2011-04-29 NOTE — Telephone Encounter (Signed)
Uric acid level is elevated consistent with gouty arthritis. Please notify patient. Does he need any additional analgesics??

## 2011-05-03 ENCOUNTER — Telehealth: Payer: Self-pay | Admitting: Internal Medicine

## 2011-05-03 NOTE — Telephone Encounter (Signed)
Pts spouse called [facility] again and said that the pharmacist did rcv the script and will be filling script for Diovan today. Pls disregard the previous message.

## 2011-05-03 NOTE — Telephone Encounter (Signed)
Pt checked with [facility] on [address] on Friday 04/30/11 and was told that no med had been sent in or called in by pcp for the pt. Pt is needing the valsartan (DIOVAN) 320 MG tablet resent to the pharmacy asap today.

## 2011-05-04 ENCOUNTER — Telehealth: Payer: Self-pay | Admitting: *Deleted

## 2011-05-04 DIAGNOSIS — M109 Gout, unspecified: Secondary | ICD-10-CM

## 2011-05-04 DIAGNOSIS — M25569 Pain in unspecified knee: Secondary | ICD-10-CM

## 2011-05-04 NOTE — Telephone Encounter (Signed)
Please refer to orthopedics.

## 2011-05-04 NOTE — Telephone Encounter (Signed)
Pt. Would like have Dr. Raliegh Ip make an appt for Orthopedics as his knee is not completely better, please.

## 2011-05-04 NOTE — Telephone Encounter (Signed)
Referral ordered

## 2011-06-24 ENCOUNTER — Encounter: Payer: Self-pay | Admitting: Internal Medicine

## 2011-07-21 ENCOUNTER — Other Ambulatory Visit (INDEPENDENT_AMBULATORY_CARE_PROVIDER_SITE_OTHER): Payer: Medicare Other

## 2011-07-21 DIAGNOSIS — IMO0001 Reserved for inherently not codable concepts without codable children: Secondary | ICD-10-CM

## 2011-07-21 DIAGNOSIS — E039 Hypothyroidism, unspecified: Secondary | ICD-10-CM

## 2011-07-21 DIAGNOSIS — I10 Essential (primary) hypertension: Secondary | ICD-10-CM

## 2011-07-21 DIAGNOSIS — Z0000 Encounter for general adult medical examination without abnormal findings: Secondary | ICD-10-CM

## 2011-07-21 DIAGNOSIS — Z125 Encounter for screening for malignant neoplasm of prostate: Secondary | ICD-10-CM

## 2011-07-21 LAB — PSA: PSA: 1.33 ng/mL (ref 0.10–4.00)

## 2011-07-21 LAB — BASIC METABOLIC PANEL
BUN: 31 mg/dL — ABNORMAL HIGH (ref 6–23)
CO2: 24 mEq/L (ref 19–32)
Calcium: 9.2 mg/dL (ref 8.4–10.5)
Chloride: 109 mEq/L (ref 96–112)
Creatinine, Ser: 1.2 mg/dL (ref 0.4–1.5)
GFR: 66.79 mL/min (ref >60.00)
Glucose, Bld: 108 mg/dL — ABNORMAL HIGH (ref 70–99)
Potassium: 5 mEq/L (ref 3.5–5.1)
Sodium: 140 mEq/L (ref 135–145)

## 2011-07-21 LAB — HEPATIC FUNCTION PANEL
ALT: 21 U/L (ref 0–53)
AST: 21 U/L (ref 0–37)
Albumin: 4.1 g/dL (ref 3.5–5.2)
Alkaline Phosphatase: 44 U/L (ref 39–117)
Bilirubin, Direct: 0.1 mg/dL (ref 0.0–0.3)
Total Bilirubin: 0.5 mg/dL (ref 0.3–1.2)
Total Protein: 7 g/dL (ref 6.0–8.3)

## 2011-07-21 LAB — CBC WITH DIFFERENTIAL/PLATELET
Basophils Absolute: 0 10*3/uL (ref 0.0–0.1)
Basophils Relative: 0.5 % (ref 0.0–3.0)
Eosinophils Absolute: 0.2 10*3/uL (ref 0.0–0.7)
Eosinophils Relative: 3.8 % (ref 0.0–5.0)
HCT: 41.8 % (ref 39.0–52.0)
Hemoglobin: 13.7 g/dL (ref 13.0–17.0)
Lymphocytes Relative: 23.7 % (ref 12.0–46.0)
Lymphs Abs: 1.4 10*3/uL (ref 0.7–4.0)
MCHC: 32.7 g/dL (ref 30.0–36.0)
MCV: 87.2 fl (ref 78.0–100.0)
Monocytes Absolute: 0.4 10*3/uL (ref 0.1–1.0)
Monocytes Relative: 7 % (ref 3.0–12.0)
Neutro Abs: 3.7 10*3/uL (ref 1.4–7.7)
Neutrophils Relative %: 65 % (ref 43.0–77.0)
Platelets: 137 10*3/uL — ABNORMAL LOW (ref 150.0–400.0)
RBC: 4.8 Mil/uL (ref 4.22–5.81)
RDW: 14.5 % (ref 11.5–14.6)
WBC: 5.7 10*3/uL (ref 4.5–10.5)

## 2011-07-21 LAB — TSH: TSH: 1.16 u[IU]/mL (ref 0.35–5.50)

## 2011-07-21 LAB — HEMOGLOBIN A1C: Hgb A1c MFr Bld: 6.6 % — ABNORMAL HIGH (ref 4.6–6.5)

## 2011-07-21 LAB — LIPID PANEL
Cholesterol: 97 mg/dL (ref 0–200)
HDL: 33.7 mg/dL — ABNORMAL LOW (ref >39.00)
LDL Cholesterol: 50 mg/dL (ref 0–99)
Total CHOL/HDL Ratio: 3
Triglycerides: 69 mg/dL (ref 0.0–149.0)
VLDL: 13.8 mg/dL (ref 0.0–40.0)

## 2011-07-21 LAB — POCT URINALYSIS DIPSTICK
Bilirubin, UA: NEGATIVE
Blood, UA: NEGATIVE
Glucose, UA: NEGATIVE
Leukocytes, UA: NEGATIVE
Nitrite, UA: NEGATIVE
Spec Grav, UA: 1.02
Urobilinogen, UA: 0.2
pH, UA: 5.5

## 2011-07-21 LAB — MICROALBUMIN / CREATININE URINE RATIO
Creatinine,U: 142 mg/dL
Microalb Creat Ratio: 10.9 mg/g (ref 0.0–30.0)
Microalb, Ur: 15.5 mg/dL — ABNORMAL HIGH (ref 0.0–1.9)

## 2011-07-28 ENCOUNTER — Ambulatory Visit (INDEPENDENT_AMBULATORY_CARE_PROVIDER_SITE_OTHER): Payer: Medicare Other | Admitting: Internal Medicine

## 2011-07-28 ENCOUNTER — Encounter: Payer: Self-pay | Admitting: Internal Medicine

## 2011-07-28 ENCOUNTER — Telehealth: Payer: Self-pay | Admitting: Internal Medicine

## 2011-07-28 VITALS — BP 132/80 | HR 80 | Temp 97.7°F | Resp 18 | Ht 68.0 in | Wt 204.0 lb

## 2011-07-28 DIAGNOSIS — M109 Gout, unspecified: Secondary | ICD-10-CM

## 2011-07-28 DIAGNOSIS — E119 Type 2 diabetes mellitus without complications: Secondary | ICD-10-CM

## 2011-07-28 DIAGNOSIS — Z23 Encounter for immunization: Secondary | ICD-10-CM

## 2011-07-28 DIAGNOSIS — I10 Essential (primary) hypertension: Secondary | ICD-10-CM

## 2011-07-28 DIAGNOSIS — Z0000 Encounter for general adult medical examination without abnormal findings: Secondary | ICD-10-CM

## 2011-07-28 MED ORDER — SIMVASTATIN 40 MG PO TABS: 40.0000 mg | ORAL_TABLET | Freq: Every day | ORAL | Status: DC

## 2011-07-28 MED ORDER — GLUCOSE BLOOD VI STRP: ORAL_STRIP | Status: DC

## 2011-07-28 MED ORDER — METFORMIN HCL ER 500 MG PO TB24: 1000.0000 mg | ORAL_TABLET | Freq: Two times a day (BID) | ORAL | Status: DC

## 2011-07-28 MED ORDER — VALSARTAN 320 MG PO TABS: 320.0000 mg | ORAL_TABLET | Freq: Every day | ORAL | Status: DC

## 2011-07-28 MED ORDER — DOXAZOSIN MESYLATE 4 MG PO TABS: 4.0000 mg | ORAL_TABLET | Freq: Every day | ORAL | Status: DC

## 2011-07-28 MED ORDER — SILDENAFIL CITRATE 50 MG PO TABS: 50.0000 mg | ORAL_TABLET | Freq: Every day | ORAL | Status: DC | PRN

## 2011-07-28 NOTE — Progress Notes (Signed)
Subjective:    Patient ID: Ralph Bell, male    DOB: 18-Apr-1940, 71 y.o.   MRN: [id number]  HPI 71 year old patient who is seen today for a preventive health examination   He has a history of type 2 diabetes which has been controlled on metformin therapy. He has dyslipidemia and hypertension. He denies any cardiopulmonary complaints. He has a history of screening colonoscopy in 2008.   1. Risk factors, based on past M,S,F history- cardiovascular risk factors include diabetes hypertension and dyslipidemia  2. Physical activities: No activity restrictions  3. Depression/mood: No history of depression or mood disorder  4. Hearing: No deficits  5. ADL's: Independent in a [place] of daily living  6. Fall risk: Low  7. Home safety: No problems identified  8. Height weight, and visual acuity; height and weight are stable no change in visual acuity  9. Counseling: Follow up colonoscopy in 2013  10. Lab orders based on risk factors: Laboratory studies were reviewed  11. Referral: Not appropriate at this time has had a recent eye examination  12. Care plan: The patient will have a repeat PSA performed in 3 months at the time of his next hemoglobin A1c  13. Cognitive assessment: Alert and oriented with normal affect   Past Medical History  Diagnosis Date  . ALLERGIC RHINITIS 11/09/2006  . ASTHMA 11/16/2006  . DIABETES MELLITUS, TYPE II 11/09/2006  . DIVERTICULITIS, HX OF 11/16/2006  . HYPERLIPIDEMIA 11/09/2006  . HYPERTENSION 11/09/2006  . ISCHEMIA 08/20/2008  . Shortness of breath 09/05/2008  . ED (erectile dysfunction)   . Rosacea   . Family hx colonic polyps   . History of kidney stones     History   Social History  . Marital Status: Married    Spouse Name: N/A    Number of Children: N/A  . Years of Education: N/A   Occupational History  . Not on file.   Social History Main Topics  . Smoking status: Former Smoker    Quit date: 04/05/1973  . Smokeless tobacco: Former Systems developer    Quit  date: 04/05/1978  . Alcohol Use: Yes  . Drug Use: No  . Sexually Active: Not on file   Other Topics Concern  . Not on file   Social History Narrative  . No narrative on file    No past surgical history on file.  No family history on file.  Allergies  Allergen Reactions  . Sulfacetamide Sodium     Current Outpatient Prescriptions on File Prior to Visit  Medication Sig Dispense Refill  . aspirin 81 MG tablet Take 81 mg by mouth daily.        [name] Kitchen doxazosin (CARDURA) 4 MG tablet Take 1 tablet (4 mg total) by mouth daily.  90 tablet  3  . fexofenadine (ALLEGRA) 180 MG tablet Take 1 tablet (180 mg total) by mouth daily.  90 tablet  3  . glucose blood (ACCU-CHEK AVIVA) test strip 1 each by Other route daily. Use as instructed       . metFORMIN (GLUCOPHAGE-XR) 500 MG 24 hr tablet Take 2 tablets (1,000 mg total) by mouth 2 (two) times daily with a meal.  360 tablet  3  . sildenafil (VIAGRA) 50 MG tablet Take 50 mg by mouth daily as needed. As directed       . simvastatin (ZOCOR) 40 MG tablet Take 1 tablet (40 mg total) by mouth daily.  90 tablet  3  . valsartan (DIOVAN) 320 MG tablet  Take 1 tablet (320 mg total) by mouth daily.  90 tablet  4    BP 132/80  Pulse 80  Temp(Src) 97.7 F (36.5 C) (Oral)  Resp 18  Ht 5\' 8"  (1.727 m)  Wt 204 lb (92.534 kg)  BMI 31.02 kg/m2  SpO2 95%       Review of Systems  Constitutional: Negative for fever, chills, activity change, appetite change and fatigue.  HENT: Negative for hearing loss, ear pain, congestion, rhinorrhea, sneezing, mouth sores, trouble swallowing, neck pain, neck stiffness, dental problem, voice change, sinus pressure and tinnitus.   Eyes: Negative for photophobia, pain, redness and visual disturbance.  Respiratory: Negative for apnea, cough, choking, chest tightness, shortness of breath and wheezing.   Cardiovascular: Negative for chest pain, palpitations and leg swelling.  Gastrointestinal: Negative for nausea, vomiting,  abdominal pain, diarrhea, constipation, blood in stool, abdominal distention, anal bleeding and rectal pain.  Genitourinary: Negative for dysuria, urgency, frequency, hematuria, flank pain, decreased urine volume, discharge, penile swelling, scrotal swelling, difficulty urinating, genital sores and testicular pain.  Musculoskeletal: Negative for myalgias, back pain, joint swelling, arthralgias and gait problem.  Skin: Negative for color change, rash and wound.  Neurological: Negative for dizziness, tremors, seizures, syncope, facial asymmetry, speech difficulty, weakness, light-headedness, numbness and headaches.  Hematological: Negative for adenopathy. Does not bruise/bleed easily.  Psychiatric/Behavioral: Negative for suicidal ideas, hallucinations, behavioral problems, confusion, sleep disturbance, self-injury, dysphoric mood, decreased concentration and agitation. The patient is not nervous/anxious.        Objective:   Physical Exam  Constitutional: He appears well-developed and well-nourished.  HENT:  Head: Normocephalic and atraumatic.  Right Ear: External ear normal.  Left Ear: External ear normal.  Nose: Nose normal.  Mouth/Throat: Oropharynx is clear and moist.  Eyes: Conjunctivae and EOM are normal. Pupils are equal, round, and reactive to light. No scleral icterus.  Neck: Normal range of motion. Neck supple. No JVD present. No thyromegaly present.  Cardiovascular: Regular rhythm, normal heart sounds and intact distal pulses.  Exam reveals no gallop and no friction rub.   No murmur heard. Pulmonary/Chest: Effort normal and breath sounds normal. He exhibits no tenderness.  Abdominal: Soft. Bowel sounds are normal. He exhibits no distension and no mass. There is no tenderness.  Genitourinary: Penis normal. Guaiac negative stool.       Prostate +2 enlarged smooth and symmetrical  Musculoskeletal: Normal range of motion. He exhibits no edema and no tenderness.  Lymphadenopathy:     He has no cervical adenopathy.  Neurological: He is alert. He has normal reflexes. No cranial nerve deficit. Coordination normal.  Skin: Skin is warm and dry. No rash noted.  Psychiatric: He has a normal mood and affect. His behavior is normal.          Assessment & Plan:    Preventive health examination Diabetes well controlled Hypertension well controlled Mild BPH  Recheck 3 months

## 2011-07-28 NOTE — Patient Instructions (Signed)
Limit your sodium (Salt) intake   Please check your hemoglobin A1c every 3 months  You need to lose weight.  Consider a lower calorie diet and regular exercise.    It is important that you exercise regularly, at least 20 minutes 3 to 4 times per week.  If you develop chest pain or shortness of breath seek  medical attention. 

## 2011-07-28 NOTE — Telephone Encounter (Signed)
Patient called stating that his rx from his appt today should go to [facility] and he will not need allegra as he gets that over the counter. Please assist.

## 2011-07-28 NOTE — Telephone Encounter (Signed)
Patient also stated that he does not need the viagra.

## 2011-11-24 ENCOUNTER — Encounter: Payer: Self-pay | Admitting: Internal Medicine

## 2011-11-24 ENCOUNTER — Ambulatory Visit (INDEPENDENT_AMBULATORY_CARE_PROVIDER_SITE_OTHER): Payer: Medicare Other | Admitting: Internal Medicine

## 2011-11-24 VITALS — BP 126/80 | Temp 98.3°F | Wt 206.0 lb

## 2011-11-24 DIAGNOSIS — N529 Male erectile dysfunction, unspecified: Secondary | ICD-10-CM

## 2011-11-24 DIAGNOSIS — I10 Essential (primary) hypertension: Secondary | ICD-10-CM

## 2011-11-24 DIAGNOSIS — E785 Hyperlipidemia, unspecified: Secondary | ICD-10-CM

## 2011-11-24 DIAGNOSIS — E119 Type 2 diabetes mellitus without complications: Secondary | ICD-10-CM

## 2011-11-24 LAB — LIPID PANEL
Cholesterol: 184 mg/dL (ref 0–200)
HDL: 44.1 mg/dL (ref >39.00)
LDL Cholesterol: 116 mg/dL — ABNORMAL HIGH (ref 0–99)
Total CHOL/HDL Ratio: 4
Triglycerides: 118 mg/dL (ref 0.0–149.0)
VLDL: 23.6 mg/dL (ref 0.0–40.0)

## 2011-11-24 LAB — HEMOGLOBIN A1C: Hgb A1c MFr Bld: 6 % (ref 4.6–6.5)

## 2011-11-24 LAB — TESTOSTERONE: Testosterone: 257.46 ng/dL — ABNORMAL LOW (ref 350.00–890.00)

## 2011-11-24 MED ORDER — GLUCOSE BLOOD VI STRP: ORAL_STRIP | Status: DC

## 2011-11-24 MED ORDER — METFORMIN HCL ER 500 MG PO TB24: 1000.0000 mg | ORAL_TABLET | Freq: Two times a day (BID) | ORAL | Status: DC

## 2011-11-24 MED ORDER — SIMVASTATIN 40 MG PO TABS: 40.0000 mg | ORAL_TABLET | Freq: Every day | ORAL | Status: DC

## 2011-11-24 MED ORDER — VALSARTAN 320 MG PO TABS: 320.0000 mg | ORAL_TABLET | Freq: Every day | ORAL | Status: DC

## 2011-11-24 MED ORDER — DOXAZOSIN MESYLATE 4 MG PO TABS: 4.0000 mg | ORAL_TABLET | Freq: Every day | ORAL | Status: DC

## 2011-11-24 NOTE — Patient Instructions (Signed)
Limit your sodium (Salt) intake    It is important that you exercise regularly, at least 20 minutes 3 to 4 times per week.  If you develop chest pain or shortness of breath seek  medical attention.  You need to lose weight.  Consider a lower calorie diet and regular exercise. 

## 2011-11-24 NOTE — Progress Notes (Signed)
  Subjective:    Patient ID: Ralph Bell, male    DOB: 1941/03/19, 71 y.o.   MRN: [id number]  HPI  71 year old patient who is seen today for followup. He has type 2 diabetes which has done quite well. Denies any hypoglycemia. Medical regimen includes metformin only. He is treated hypertension as well as a history of dyslipidemia. For the past few months he has had simvastatin on hold due to a vague sense of unwellness. Will check a lipid profile today. He requires all medications refilled. No new concerns or complaints  Wt Readings from Last 3 Encounters:  11/24/11 206 lb (93.441 kg)  07/28/11 204 lb (92.534 kg)  04/27/11 212 lb (96.163 kg)     Review of Systems  Constitutional: Negative for fever, chills, appetite change and fatigue.  HENT: Negative for hearing loss, ear pain, congestion, sore throat, trouble swallowing, neck stiffness, dental problem, voice change and tinnitus.   Eyes: Negative for pain, discharge and visual disturbance.  Respiratory: Negative for cough, chest tightness, wheezing and stridor.   Cardiovascular: Negative for chest pain, palpitations and leg swelling.  Gastrointestinal: Negative for nausea, vomiting, abdominal pain, diarrhea, constipation, blood in stool and abdominal distention.  Genitourinary: Negative for urgency, hematuria, flank pain, discharge, difficulty urinating and genital sores.  Musculoskeletal: Negative for myalgias, back pain, joint swelling, arthralgias and gait problem.  Skin: Negative for rash.  Neurological: Negative for dizziness, syncope, speech difficulty, weakness, numbness and headaches.  Hematological: Negative for adenopathy. Does not bruise/bleed easily.  Psychiatric/Behavioral: Negative for behavioral problems and dysphoric mood. The patient is not nervous/anxious.        Objective:   Physical Exam  Constitutional: He is oriented to person, place, and time. He appears well-developed.  HENT:  Head: Normocephalic.  Right  Ear: External ear normal.  Left Ear: External ear normal.  Eyes: Conjunctivae and EOM are normal.  Neck: Normal range of motion.  Cardiovascular: Normal rate and normal heart sounds.   Pulmonary/Chest: Breath sounds normal.  Abdominal: Bowel sounds are normal.  Musculoskeletal: Normal range of motion. He exhibits no edema and no tenderness.  Neurological: He is alert and oriented to person, place, and time.  Psychiatric: He has a normal mood and affect. His behavior is normal.          Assessment & Plan:   Diabetes mellitus. Will check a hemoglobin A1c Erectile dysfunction. He also describes decreased libido. Will check a testosterone level. Samples of Cialis and Levitra dispensed Hypertension well controlled. Medications refilled Dyslipidemia. A lipid profile will be reviewed. We'll likely resume simvastatin at a modified dose

## 2011-11-25 ENCOUNTER — Other Ambulatory Visit: Payer: Self-pay | Admitting: Internal Medicine

## 2011-11-25 MED ORDER — TESTOSTERONE 20.25 MG/ACT (1.62%) TD GEL: 2.0000 "application " | TRANSDERMAL | Status: DC

## 2012-01-07 ENCOUNTER — Ambulatory Visit (INDEPENDENT_AMBULATORY_CARE_PROVIDER_SITE_OTHER): Payer: Medicare Other

## 2012-01-07 DIAGNOSIS — Z23 Encounter for immunization: Secondary | ICD-10-CM

## 2012-01-18 ENCOUNTER — Ambulatory Visit (INDEPENDENT_AMBULATORY_CARE_PROVIDER_SITE_OTHER): Payer: Medicare Other | Admitting: Internal Medicine

## 2012-01-18 DIAGNOSIS — Z2911 Encounter for prophylactic immunotherapy for respiratory syncytial virus (RSV): Secondary | ICD-10-CM

## 2012-01-18 DIAGNOSIS — Z0000 Encounter for general adult medical examination without abnormal findings: Secondary | ICD-10-CM

## 2012-03-07 ENCOUNTER — Encounter: Payer: Self-pay | Admitting: Internal Medicine

## 2012-03-07 ENCOUNTER — Ambulatory Visit (INDEPENDENT_AMBULATORY_CARE_PROVIDER_SITE_OTHER): Payer: Medicare Other | Admitting: Internal Medicine

## 2012-03-07 VITALS — BP 136/80 | HR 54 | Temp 97.8°F | Resp 16 | Wt 206.0 lb

## 2012-03-07 DIAGNOSIS — I10 Essential (primary) hypertension: Secondary | ICD-10-CM

## 2012-03-07 DIAGNOSIS — E119 Type 2 diabetes mellitus without complications: Secondary | ICD-10-CM

## 2012-03-07 DIAGNOSIS — E349 Endocrine disorder, unspecified: Secondary | ICD-10-CM | POA: Insufficient documentation

## 2012-03-07 DIAGNOSIS — E291 Testicular hypofunction: Secondary | ICD-10-CM

## 2012-03-07 DIAGNOSIS — Z23 Encounter for immunization: Secondary | ICD-10-CM

## 2012-03-07 LAB — TESTOSTERONE: Testosterone: 290.02 ng/dL — ABNORMAL LOW (ref 350.00–890.00)

## 2012-03-07 LAB — HEMOGLOBIN A1C: Hgb A1c MFr Bld: 6.5 % (ref 4.6–6.5)

## 2012-03-07 MED ORDER — TETANUS-DIPHTH-ACELL PERTUSSIS 5-2.5-18.5 LF-MCG/0.5 IM SUSP: 0.5000 mL | Freq: Once | INTRAMUSCULAR | Status: DC

## 2012-03-07 NOTE — Patient Instructions (Signed)
Limit your sodium (Salt) intake    It is important that you exercise regularly, at least 20 minutes 3 to 4 times per week.  If you develop chest pain or shortness of breath seek  medical attention.  You need to lose weight.  Consider a lower calorie diet and regular exercise. 

## 2012-03-07 NOTE — Progress Notes (Signed)
  Subjective:    Patient ID: Ralph Bell, male    DOB: 04-27-1940, 71 y.o.   MRN: [id number]  HPI  Wt Readings from Last 3 Encounters:  03/07/12 206 lb (93.441 kg)  11/24/11 206 lb (93.441 kg)  07/28/11 204 lb (92.534 kg)    Review of Systems     Objective:   Physical Exam        Assessment & Plan:

## 2012-03-07 NOTE — Progress Notes (Signed)
Subjective:    Patient ID: Ralph Bell, male    DOB: 1940/06/14, 71 y.o.   MRN: [id number]  HPI  71 year old patient who has diabetes and hypertension. He also has a history of testosterone deficiency and has been on AndroGel for the past few months. No concerns or complaints. Last and the A1c 6.0  Past Medical History  Diagnosis Date  . ALLERGIC RHINITIS 11/09/2006  . ASTHMA 11/16/2006  . DIABETES MELLITUS, TYPE II 11/09/2006  . DIVERTICULITIS, HX OF 11/16/2006  . HYPERLIPIDEMIA 11/09/2006  . HYPERTENSION 11/09/2006  . ISCHEMIA 08/20/2008  . Shortness of breath 09/05/2008  . ED (erectile dysfunction)   . Rosacea   . Family hx colonic polyps   . History of kidney stones     History   Social History  . Marital Status: Married    Spouse Name: N/A    Number of Children: N/A  . Years of Education: N/A   Occupational History  . Not on file.   Social History Main Topics  . Smoking status: Former Smoker    Quit date: 04/05/1973  . Smokeless tobacco: Former Systems developer    Quit date: 04/05/1978  . Alcohol Use: Yes  . Drug Use: No  . Sexually Active: Not on file   Other Topics Concern  . Not on file   Social History Narrative  . No narrative on file    No past surgical history on file.  No family history on file.  Allergies  Allergen Reactions  . Sulfacetamide Sodium     Current Outpatient Prescriptions on File Prior to Visit  Medication Sig Dispense Refill  . aspirin 81 MG tablet Take 81 mg by mouth daily.        [name] Kitchen doxazosin (CARDURA) 4 MG tablet Take 1 tablet (4 mg total) by mouth daily.  90 tablet  3  . fexofenadine (ALLEGRA) 180 MG tablet Take 1 tablet (180 mg total) by mouth daily.  90 tablet  3  . glucose blood (ACCU-CHEK AVIVA) test strip Use daily as directed  100 each  6  . metFORMIN (GLUCOPHAGE-XR) 500 MG 24 hr tablet Take 2 tablets (1,000 mg total) by mouth 2 (two) times daily with a meal.  360 tablet  3  . sildenafil (VIAGRA) 50 MG tablet Take 1 tablet (50 mg total)  by mouth daily as needed. As directed  10 tablet  6  . simvastatin (ZOCOR) 40 MG tablet Take 1 tablet (40 mg total) by mouth daily.  90 tablet  3  . Testosterone 20.25 MG/ACT (1.62%) GEL Place 2 application onto the skin 1 day or 1 dose.  75 g  3  . valsartan (DIOVAN) 320 MG tablet Take 1 tablet (320 mg total) by mouth daily.  90 tablet  4   No current facility-administered medications on file prior to visit.    BP 136/80  Pulse 54  Temp 97.8 F (36.6 C) (Oral)  Resp 16  Wt 206 lb (93.441 kg)  SpO2 98%       Review of Systems  Constitutional: Negative for fever, chills, appetite change and fatigue.  HENT: Negative for hearing loss, ear pain, congestion, sore throat, trouble swallowing, neck stiffness, dental problem, voice change and tinnitus.   Eyes: Negative for pain, discharge and visual disturbance.  Respiratory: Negative for cough, chest tightness, wheezing and stridor.   Cardiovascular: Negative for chest pain, palpitations and leg swelling.  Gastrointestinal: Negative for nausea, vomiting, abdominal pain, diarrhea, constipation, blood in stool and abdominal  distention.  Genitourinary: Negative for urgency, hematuria, flank pain, discharge, difficulty urinating and genital sores.  Musculoskeletal: Negative for myalgias, back pain, joint swelling, arthralgias and gait problem.  Skin: Negative for rash.  Neurological: Negative for dizziness, syncope, speech difficulty, weakness, numbness and headaches.  Hematological: Negative for adenopathy. Does not bruise/bleed easily.  Psychiatric/Behavioral: Negative for behavioral problems and dysphoric mood. The patient is not nervous/anxious.        Objective:   Physical Exam  Constitutional: He is oriented to person, place, and time. He appears well-developed.  HENT:  Head: Normocephalic.  Right Ear: External ear normal.  Left Ear: External ear normal.  Eyes: Conjunctivae normal and EOM are normal.  Neck: Normal range of  motion.  Cardiovascular: Normal rate and normal heart sounds.   Pulmonary/Chest: Breath sounds normal.  Abdominal: Bowel sounds are normal.  Musculoskeletal: Normal range of motion. He exhibits no edema and no tenderness.  Neurological: He is alert and oriented to person, place, and time.  Psychiatric: He has a normal mood and affect. His behavior is normal.          Assessment & Plan:   Diabetes mellitus. Appears to be well controlled. We'll check a hemoglobin A1c Testosterone deficiency. We'll check a followup testosterone level Hypertension stable

## 2012-06-21 ENCOUNTER — Telehealth: Payer: Self-pay | Admitting: Internal Medicine

## 2012-06-21 MED ORDER — TESTOSTERONE 20.25 MG/ACT (1.62%) TD GEL: 2.0000 "application " | TRANSDERMAL | Status: DC

## 2012-06-21 NOTE — Telephone Encounter (Signed)
Please put orders in for CPX labs and Testosterone level. Pt scheduled April 22 for labs. Thanks  Refill for Testosterone Gel already faxed to pharmacy.

## 2012-06-21 NOTE — Telephone Encounter (Signed)
Pt needs refill of Testosterone 20.25 MG/ACT (1.62%) GEL.  Pharm; [facility]  Pt has CPX April 29. Pt would like to confirm testesterone labs are included in his CPX labs April 22 to ensure the GEL is doing its job.

## 2012-06-21 NOTE — Telephone Encounter (Signed)
Pt notified Rx for Testosterone was faxed to [facility].  Rx printed and signed by Dr. Burnice Logan faxed to [phone].

## 2012-06-27 ENCOUNTER — Other Ambulatory Visit: Payer: Self-pay | Admitting: *Deleted

## 2012-06-27 MED ORDER — TESTOSTERONE 20.25 MG/ACT (1.62%) TD GEL: 2.0000 "application " | Freq: Every day | TRANSDERMAL | Status: DC

## 2012-07-06 ENCOUNTER — Telehealth: Payer: Self-pay | Admitting: *Deleted

## 2012-07-06 MED ORDER — TESTOSTERONE 20.25 MG/ACT (1.62%) TD GEL: 2.0000 "application " | Freq: Every day | TRANSDERMAL | Status: DC

## 2012-07-06 NOTE — Telephone Encounter (Signed)
Rx for Testosterone Androgel Pump called into pharmacy needed to clarify Rx. Called [facility].

## 2012-07-25 ENCOUNTER — Other Ambulatory Visit (INDEPENDENT_AMBULATORY_CARE_PROVIDER_SITE_OTHER): Payer: Medicare Other

## 2012-07-25 DIAGNOSIS — R972 Elevated prostate specific antigen [PSA]: Secondary | ICD-10-CM

## 2012-07-25 DIAGNOSIS — E785 Hyperlipidemia, unspecified: Secondary | ICD-10-CM

## 2012-07-25 DIAGNOSIS — Z0000 Encounter for general adult medical examination without abnormal findings: Secondary | ICD-10-CM

## 2012-07-25 DIAGNOSIS — E119 Type 2 diabetes mellitus without complications: Secondary | ICD-10-CM

## 2012-07-25 LAB — HEPATIC FUNCTION PANEL
ALT: 22 U/L (ref 0–53)
AST: 21 U/L (ref 0–37)
Albumin: 3.8 g/dL (ref 3.5–5.2)
Alkaline Phosphatase: 49 U/L (ref 39–117)
Bilirubin, Direct: 0.2 mg/dL (ref 0.0–0.3)
Total Bilirubin: 1 mg/dL (ref 0.3–1.2)
Total Protein: 6.7 g/dL (ref 6.0–8.3)

## 2012-07-25 LAB — POCT URINALYSIS DIPSTICK
Bilirubin, UA: NEGATIVE
Glucose, UA: NEGATIVE
Ketones, UA: NEGATIVE
Leukocytes, UA: NEGATIVE
Nitrite, UA: NEGATIVE
Spec Grav, UA: >=1.03
Urobilinogen, UA: 0.2
pH, UA: 6

## 2012-07-25 LAB — CBC WITH DIFFERENTIAL/PLATELET
Basophils Absolute: 0 10*3/uL (ref 0.0–0.1)
Basophils Relative: 0.3 % (ref 0.0–3.0)
Eosinophils Absolute: 0.4 10*3/uL (ref 0.0–0.7)
Eosinophils Relative: 5.3 % — ABNORMAL HIGH (ref 0.0–5.0)
HCT: 44.3 % (ref 39.0–52.0)
Hemoglobin: 14.8 g/dL (ref 13.0–17.0)
Lymphocytes Relative: 20.9 % (ref 12.0–46.0)
Lymphs Abs: 1.5 10*3/uL (ref 0.7–4.0)
MCHC: 33.4 g/dL (ref 30.0–36.0)
MCV: 85.6 fl (ref 78.0–100.0)
Monocytes Absolute: 0.5 10*3/uL (ref 0.1–1.0)
Monocytes Relative: 6.6 % (ref 3.0–12.0)
Neutro Abs: 4.7 10*3/uL (ref 1.4–7.7)
Neutrophils Relative %: 66.9 % (ref 43.0–77.0)
Platelets: 157 10*3/uL (ref 150.0–400.0)
RBC: 5.17 Mil/uL (ref 4.22–5.81)
RDW: 13.7 % (ref 11.5–14.6)
WBC: 7 10*3/uL (ref 4.5–10.5)

## 2012-07-25 LAB — BASIC METABOLIC PANEL
BUN: 22 mg/dL (ref 6–23)
CO2: 28 mEq/L (ref 19–32)
Calcium: 9.3 mg/dL (ref 8.4–10.5)
Chloride: 105 mEq/L (ref 96–112)
Creatinine, Ser: 1.3 mg/dL (ref 0.4–1.5)
GFR: 57.3 mL/min — ABNORMAL LOW (ref >60.00)
Glucose, Bld: 123 mg/dL — ABNORMAL HIGH (ref 70–99)
Potassium: 4.4 mEq/L (ref 3.5–5.1)
Sodium: 140 mEq/L (ref 135–145)

## 2012-07-25 LAB — TSH: TSH: 1.25 u[IU]/mL (ref 0.35–5.50)

## 2012-07-25 LAB — LIPID PANEL
Cholesterol: 104 mg/dL (ref 0–200)
HDL: 34.1 mg/dL — ABNORMAL LOW (ref >39.00)
LDL Cholesterol: 50 mg/dL (ref 0–99)
Total CHOL/HDL Ratio: 3
Triglycerides: 99 mg/dL (ref 0.0–149.0)
VLDL: 19.8 mg/dL (ref 0.0–40.0)

## 2012-07-25 LAB — HEMOGLOBIN A1C: Hgb A1c MFr Bld: 6.7 % — ABNORMAL HIGH (ref 4.6–6.5)

## 2012-07-25 LAB — PSA: PSA: 1.73 ng/mL (ref 0.10–4.00)

## 2012-07-25 LAB — MICROALBUMIN / CREATININE URINE RATIO
Creatinine,U: 165.2 mg/dL
Microalb Creat Ratio: 97.5 mg/g — ABNORMAL HIGH (ref 0.0–30.0)
Microalb, Ur: 161.1 mg/dL — ABNORMAL HIGH (ref 0.0–1.9)

## 2012-08-01 ENCOUNTER — Ambulatory Visit (INDEPENDENT_AMBULATORY_CARE_PROVIDER_SITE_OTHER): Payer: Medicare Other | Admitting: Internal Medicine

## 2012-08-01 ENCOUNTER — Encounter: Payer: Self-pay | Admitting: Internal Medicine

## 2012-08-01 VITALS — BP 160/80 | HR 51 | Temp 98.4°F | Resp 18 | Ht 68.0 in | Wt 204.0 lb

## 2012-08-01 DIAGNOSIS — M109 Gout, unspecified: Secondary | ICD-10-CM

## 2012-08-01 DIAGNOSIS — I999 Unspecified disorder of circulatory system: Secondary | ICD-10-CM

## 2012-08-01 DIAGNOSIS — E119 Type 2 diabetes mellitus without complications: Secondary | ICD-10-CM

## 2012-08-01 DIAGNOSIS — Z0000 Encounter for general adult medical examination without abnormal findings: Secondary | ICD-10-CM

## 2012-08-01 DIAGNOSIS — E291 Testicular hypofunction: Secondary | ICD-10-CM

## 2012-08-01 DIAGNOSIS — R972 Elevated prostate specific antigen [PSA]: Secondary | ICD-10-CM

## 2012-08-01 DIAGNOSIS — E785 Hyperlipidemia, unspecified: Secondary | ICD-10-CM

## 2012-08-01 DIAGNOSIS — I10 Essential (primary) hypertension: Secondary | ICD-10-CM

## 2012-08-01 DIAGNOSIS — E349 Endocrine disorder, unspecified: Secondary | ICD-10-CM

## 2012-08-01 LAB — TESTOSTERONE: Testosterone: 127.02 ng/dL — ABNORMAL LOW (ref 350.00–890.00)

## 2012-08-01 MED ORDER — DOXAZOSIN MESYLATE 8 MG PO TABS: 8.0000 mg | ORAL_TABLET | Freq: Every day | ORAL | Status: DC

## 2012-08-01 MED ORDER — ALLOPURINOL 100 MG PO TABS: 100.0000 mg | ORAL_TABLET | Freq: Every day | ORAL | Status: DC

## 2012-08-01 MED ORDER — VALSARTAN-HYDROCHLOROTHIAZIDE 320-25 MG PO TABS: 1.0000 | ORAL_TABLET | Freq: Every day | ORAL | Status: DC

## 2012-08-01 NOTE — Patient Instructions (Addendum)
Limit your sodium (Salt) intake   Please check your hemoglobin A1c every 3 months    It is important that you exercise regularly, at least 20 minutes 3 to 4 times per week.  If you develop chest pain or shortness of breath seek  medical attention.  You need to lose weight.  Consider a lower calorie diet and regular exercise. 

## 2012-08-01 NOTE — Progress Notes (Signed)
Patient ID: Ralph Bell, male   DOB: 03-02-1941, 72 y.o.   MRN: [id number]  Subjective:    Patient ID: Ralph Bell, male    DOB: 11/29/1940, 72 y.o.   MRN: [id number]  HPI 72 year-old patient who is seen today for a preventive health examination   He has a history of type 2 diabetes which has been controlled on metformin therapy. He has dyslipidemia and hypertension. He denies any cardiopulmonary complaints. He has a history of screening colonoscopy in 2008.  Patient has had a one-week history of pain and swelling involving his left foot. He has a quarterly attacks of gouty arthritis. Hydrochlorothiazide has been discontinued due 2 probable gout and mild hyperuricemia. Since hydrochlorothiazide has been discontinued he has had increase in systolic blood pressure readings  1. Risk factors, based on past M,S,F history- cardiovascular risk factors include diabetes hypertension and dyslipidemia  2. Physical activities: No activity restrictions  3. Depression/mood: No history of depression or mood disorder  4. Hearing: No deficits  5. ADL's: Independent in a Las[place] of daily living  6. Fall risk: Low  7. Home safety: No problems identified  8. Height weight, and visual acuity; height and weight are stable no change in visual acuity  9. Counseling: Follow up colonoscopy in 2013  10. Lab orders based on risk factors: Laboratory studies were reviewed  11. Referral: Not appropriate at this time has had a recent eye examination  12. Care plan: The patient will have a repeat PSA performed in 3 months at the time of his next hemoglobin A1c  13. Cognitive assessment: Alert and oriented with normal affect   Past Medical History  Diagnosis Date  . ALLERGIC RHINITIS 11/09/2006  . ASTHMA 11/16/2006  . DIABETES MELLITUS, TYPE II 11/09/2006  . DIVERTICULITIS, HX OF 11/16/2006  . HYPERLIPIDEMIA 11/09/2006  . HYPERTENSION 11/09/2006  . ISCHEMIA 08/20/2008  . Shortness of breath 09/05/2008  . ED (erectile  dysfunction)   . Rosacea   . Family hx colonic polyps   . History of kidney stones     History   Social History  . Marital Status: Married    Spouse Name: N/A    Number of Children: N/A  . Years of Education: N/A   Occupational History  . Not on file.   Social History Main Topics  . Smoking status: Former Smoker    Quit date: 04/05/1973  . Smokeless tobacco: Former Systems developer    Quit date: 04/05/1978  . Alcohol Use: Yes  . Drug Use: No  . Sexually Active: Not on file   Other Topics Concern  . Not on file   Social History Narrative  . No narrative on file    History reviewed. No pertinent past surgical history.  No family history on file.  Allergies  Allergen Reactions  . Sulfacetamide Sodium     Current Outpatient Prescriptions on File Prior to Visit  Medication Sig Dispense Refill  . aspirin 81 MG tablet Take 81 mg by mouth daily.        [name] Kitchen doxazosin (CARDURA) 4 MG tablet Take 1 tablet (4 mg total) by mouth daily.  90 tablet  3  . fexofenadine (ALLEGRA) 180 MG tablet Take 1 tablet (180 mg total) by mouth daily.  90 tablet  3  . glucose blood (ACCU-CHEK AVIVA) test strip Use daily as directed  100 each  6  . metFORMIN (GLUCOPHAGE-XR) 500 MG 24 hr tablet Take 2 tablets (1,000 mg total) by mouth 2 (  two) times daily with a meal.  360 tablet  3  . sildenafil (VIAGRA) 50 MG tablet Take 1 tablet (50 mg total) by mouth daily as needed. As directed  10 tablet  6  . simvastatin (ZOCOR) 40 MG tablet Take 1 tablet (40 mg total) by mouth daily.  90 tablet  3  . Testosterone (ANDROGEL PUMP) 20.25 MG/ACT (1.62%) GEL Place 2 application onto the skin daily.  225 g  1  . valsartan (DIOVAN) 320 MG tablet Take 1 tablet (320 mg total) by mouth daily.  90 tablet  4   No current facility-administered medications on file prior to visit.    BP 160/80  Pulse 51  Temp(Src) 98.4 F (36.9 C) (Oral)  Resp 18  Ht 5\' 8"  (1.727 m)  Wt 204 lb (92.534 kg)  BMI 31.03 kg/m2  SpO2  96%       Review of Systems  Constitutional: Negative for fever, chills, activity change, appetite change and fatigue.  HENT: Negative for hearing loss, ear pain, congestion, rhinorrhea, sneezing, mouth sores, trouble swallowing, neck pain, neck stiffness, dental problem, voice change, sinus pressure and tinnitus.   Eyes: Negative for photophobia, pain, redness and visual disturbance.  Respiratory: Negative for apnea, cough, choking, chest tightness, shortness of breath and wheezing.   Cardiovascular: Negative for chest pain, palpitations and leg swelling.  Gastrointestinal: Negative for nausea, vomiting, abdominal pain, diarrhea, constipation, blood in stool, abdominal distention, anal bleeding and rectal pain.  Genitourinary: Negative for dysuria, urgency, frequency, hematuria, flank pain, decreased urine volume, discharge, penile swelling, scrotal swelling, difficulty urinating, genital sores and testicular pain.  Musculoskeletal: Negative for myalgias, back pain, joint swelling, arthralgias and gait problem.  Skin: Negative for color change, rash and wound.  Neurological: Negative for dizziness, tremors, seizures, syncope, facial asymmetry, speech difficulty, weakness, light-headedness, numbness and headaches.  Hematological: Negative for adenopathy. Does not bruise/bleed easily.  Psychiatric/Behavioral: Negative for suicidal ideas, hallucinations, behavioral problems, confusion, sleep disturbance, self-injury, dysphoric mood, decreased concentration and agitation. The patient is not nervous/anxious.        Objective:   Physical Exam  Constitutional: He appears well-developed and well-nourished.  HENT:  Head: Normocephalic and atraumatic.  Right Ear: External ear normal.  Left Ear: External ear normal.  Nose: Nose normal.  Mouth/Throat: Oropharynx is clear and moist.  Eyes: Conjunctivae and EOM are normal. Pupils are equal, round, and reactive to light. No scleral icterus.  Neck:  Normal range of motion. Neck supple. No JVD present. No thyromegaly present.  Cardiovascular: Regular rhythm, normal heart sounds and intact distal pulses.  Exam reveals no gallop and no friction rub.   No murmur heard. Pulmonary/Chest: Effort normal and breath sounds normal. He exhibits no tenderness.  Abdominal: Soft. Bowel sounds are normal. He exhibits no distension and no mass. There is no tenderness.  Genitourinary: Penis normal. Guaiac negative stool.  Prostate +2 enlarged smooth and symmetrical  Musculoskeletal: Normal range of motion. He exhibits no edema and no tenderness.  Lymphadenopathy:    He has no cervical adenopathy.  Neurological: He is alert. He has normal reflexes. No cranial nerve deficit. Coordination normal.  Skin: Skin is warm and dry. No rash noted.  Psychiatric: He has a normal mood and affect. His behavior is normal.          Assessment & Plan:    Preventive health examination Diabetes well controlled Hypertension elevated systolic readings. Will resume hydrochlorothiazide and increase Cardura to 8 mg daily Mild BPH Gout/hyperuricemia will add  allopurinol 100 mg daily. We'll check a uric acid level in 3 months  Recheck 3 months

## 2012-08-02 ENCOUNTER — Other Ambulatory Visit: Payer: Self-pay | Admitting: Internal Medicine

## 2012-08-02 DIAGNOSIS — E349 Endocrine disorder, unspecified: Secondary | ICD-10-CM

## 2012-08-25 ENCOUNTER — Other Ambulatory Visit (INDEPENDENT_AMBULATORY_CARE_PROVIDER_SITE_OTHER): Payer: Medicare Other

## 2012-08-25 DIAGNOSIS — E349 Endocrine disorder, unspecified: Secondary | ICD-10-CM

## 2012-08-25 DIAGNOSIS — E291 Testicular hypofunction: Secondary | ICD-10-CM

## 2012-08-25 LAB — TESTOSTERONE: Testosterone: 299.36 ng/dL — ABNORMAL LOW (ref 350.00–890.00)

## 2012-08-26 NOTE — Progress Notes (Signed)
Continue 4 pumps daily; level is near normal

## 2012-11-07 ENCOUNTER — Ambulatory Visit: Payer: Medicare Other | Admitting: Internal Medicine

## 2012-11-08 ENCOUNTER — Encounter: Payer: Self-pay | Admitting: Internal Medicine

## 2012-11-08 ENCOUNTER — Ambulatory Visit (INDEPENDENT_AMBULATORY_CARE_PROVIDER_SITE_OTHER): Payer: Medicare Other | Admitting: Internal Medicine

## 2012-11-08 VITALS — BP 150/80 | HR 56 | Temp 97.7°F | Resp 20 | Wt 200.0 lb

## 2012-11-08 DIAGNOSIS — E119 Type 2 diabetes mellitus without complications: Secondary | ICD-10-CM

## 2012-11-08 DIAGNOSIS — E349 Endocrine disorder, unspecified: Secondary | ICD-10-CM

## 2012-11-08 DIAGNOSIS — I10 Essential (primary) hypertension: Secondary | ICD-10-CM

## 2012-11-08 DIAGNOSIS — E291 Testicular hypofunction: Secondary | ICD-10-CM

## 2012-11-08 LAB — TESTOSTERONE: Testosterone: 204.69 ng/dL — ABNORMAL LOW (ref 350.00–890.00)

## 2012-11-08 LAB — HEMOGLOBIN A1C: Hgb A1c MFr Bld: 6.1 % (ref 4.6–6.5)

## 2012-11-08 NOTE — Progress Notes (Signed)
Subjective:    Patient ID: Ralph Bell, male    DOB: December 13, 1940, 72 y.o.   MRN: [id number]  HPI  72 year old patient who is seen today for followup of type 2 diabetes. Hemoglobin A1c is have been under excellent control for some time on metformin therapy alone. He has a history of testosterone deficiency and his topical replacement medication has been up titrated. He is treated hypertension. He remains on combination therapy. Systolic blood pressure readings have been high normal  BP Readings from Last 3 Encounters:  11/08/12 150/80  08/01/12 160/80  03/07/12 136/80   Past Medical History  Diagnosis Date  . ALLERGIC RHINITIS 11/09/2006  . ASTHMA 11/16/2006  . DIABETES MELLITUS, TYPE II 11/09/2006  . DIVERTICULITIS, HX OF 11/16/2006  . HYPERLIPIDEMIA 11/09/2006  . HYPERTENSION 11/09/2006  . ISCHEMIA 08/20/2008  . Shortness of breath 09/05/2008  . ED (erectile dysfunction)   . Rosacea   . Family hx colonic polyps   . History of kidney stones     History   Social History  . Marital Status: Married    Spouse Name: N/A    Number of Children: N/A  . Years of Education: N/A   Occupational History  . Not on file.   Social History Main Topics  . Smoking status: Former Smoker    Quit date: 04/05/1973  . Smokeless tobacco: Former Systems developer    Quit date: 04/05/1978  . Alcohol Use: Yes  . Drug Use: No  . Sexually Active: Not on file   Other Topics Concern  . Not on file   Social History Narrative  . No narrative on file    History reviewed. No pertinent past surgical history.  No family history on file.  Allergies  Allergen Reactions  . Sulfacetamide Sodium     Current Outpatient Prescriptions on File Prior to Visit  Medication Sig Dispense Refill  . allopurinol (ZYLOPRIM) 100 MG tablet Take 1 tablet (100 mg total) by mouth daily.  30 tablet  6  . aspirin 81 MG tablet Take 81 mg by mouth daily.        [name] Kitchen doxazosin (CARDURA) 8 MG tablet Take 1 tablet (8 mg total) by mouth at  bedtime.  90 tablet  3  . fexofenadine (ALLEGRA) 180 MG tablet Take 1 tablet (180 mg total) by mouth daily.  90 tablet  3  . glucose blood (ACCU-CHEK AVIVA) test strip Use daily as directed  100 each  6  . metFORMIN (GLUCOPHAGE-XR) 500 MG 24 hr tablet Take 2 tablets (1,000 mg total) by mouth 2 (two) times daily with a meal.  360 tablet  3  . sildenafil (VIAGRA) 50 MG tablet Take 1 tablet (50 mg total) by mouth daily as needed. As directed  10 tablet  6  . simvastatin (ZOCOR) 40 MG tablet Take 1 tablet (40 mg total) by mouth daily.  90 tablet  3  . Testosterone (ANDROGEL PUMP) 20.25 MG/ACT (1.62%) GEL Place 2 application onto the skin daily.  225 g  1  . valsartan-hydrochlorothiazide (DIOVAN-HCT) 320-25 MG per tablet Take 1 tablet by mouth daily.  90 tablet  4   No current facility-administered medications on file prior to visit.    BP 150/80  Pulse 56  Temp(Src) 97.7 F (36.5 C) (Oral)  Resp 20  Wt 200 lb (90.719 kg)  BMI 30.42 kg/m2  SpO2 98%    Review of Systems  Constitutional: Negative for fever, chills, appetite change and fatigue.  HENT: Negative for  hearing loss, ear pain, congestion, sore throat, trouble swallowing, neck stiffness, dental problem, voice change and tinnitus.   Eyes: Negative for pain, discharge and visual disturbance.  Respiratory: Negative for cough, chest tightness, wheezing and stridor.   Cardiovascular: Negative for chest pain, palpitations and leg swelling.  Gastrointestinal: Negative for nausea, vomiting, abdominal pain, diarrhea, constipation, blood in stool and abdominal distention.  Genitourinary: Negative for urgency, hematuria, flank pain, discharge, difficulty urinating and genital sores.  Musculoskeletal: Negative for myalgias, back pain, joint swelling, arthralgias and gait problem.  Skin: Negative for rash.  Neurological: Negative for dizziness, syncope, speech difficulty, weakness, numbness and headaches.  Hematological: Negative for  adenopathy. Does not bruise/bleed easily.  Psychiatric/Behavioral: Negative for behavioral problems and dysphoric mood. The patient is not nervous/anxious.        Objective:   Physical Exam  Constitutional: He is oriented to person, place, and time. He appears well-developed.  Repeat blood pressure still 150/80  HENT:  Head: Normocephalic.  Right Ear: External ear normal.  Left Ear: External ear normal.  Eyes: Conjunctivae and EOM are normal.  Neck: Normal range of motion.  Cardiovascular: Normal rate and normal heart sounds.   Pulmonary/Chest: Breath sounds normal.  Abdominal: Bowel sounds are normal.  Musculoskeletal: Normal range of motion. He exhibits no edema and no tenderness.  Neurological: He is alert and oriented to person, place, and time.  Psychiatric: He has a normal mood and affect. His behavior is normal.          Assessment & Plan:   Hypertension Diabetes. We'll check a hemoglobin A 1C Testosterone deficiency. We'll check a followup testosterone level

## 2012-11-08 NOTE — Patient Instructions (Signed)
Limit your sodium (Salt) intake    It is important that you exercise regularly, at least 20 minutes 3 to 4 times per week.  If you develop chest pain or shortness of breath seek  medical attention.   Please check your hemoglobin A1c every 3 months  Please check your blood pressure on a regular basis.  If it is consistently greater than 150/90, please make an office appointment.

## 2013-03-12 ENCOUNTER — Ambulatory Visit (INDEPENDENT_AMBULATORY_CARE_PROVIDER_SITE_OTHER): Payer: Medicare Other | Admitting: Internal Medicine

## 2013-03-12 ENCOUNTER — Other Ambulatory Visit: Payer: Self-pay | Admitting: *Deleted

## 2013-03-12 ENCOUNTER — Encounter: Payer: Self-pay | Admitting: Internal Medicine

## 2013-03-12 VITALS — BP 136/80 | HR 47 | Temp 97.7°F | Resp 20 | Wt 200.0 lb

## 2013-03-12 DIAGNOSIS — Z23 Encounter for immunization: Secondary | ICD-10-CM

## 2013-03-12 DIAGNOSIS — E119 Type 2 diabetes mellitus without complications: Secondary | ICD-10-CM

## 2013-03-12 DIAGNOSIS — I10 Essential (primary) hypertension: Secondary | ICD-10-CM

## 2013-03-12 DIAGNOSIS — E785 Hyperlipidemia, unspecified: Secondary | ICD-10-CM

## 2013-03-12 DIAGNOSIS — E349 Endocrine disorder, unspecified: Secondary | ICD-10-CM

## 2013-03-12 DIAGNOSIS — E291 Testicular hypofunction: Secondary | ICD-10-CM

## 2013-03-12 LAB — HEMOGLOBIN A1C: Hgb A1c MFr Bld: 6.2 % (ref 4.6–6.5)

## 2013-03-12 MED ORDER — SIMVASTATIN 40 MG PO TABS: 40.0000 mg | ORAL_TABLET | Freq: Every day | ORAL | Status: DC

## 2013-03-12 MED ORDER — FEXOFENADINE HCL 180 MG PO TABS: 180.0000 mg | ORAL_TABLET | Freq: Every day | ORAL | Status: DC

## 2013-03-12 MED ORDER — ALLOPURINOL 100 MG PO TABS: 100.0000 mg | ORAL_TABLET | Freq: Every day | ORAL | Status: DC

## 2013-03-12 MED ORDER — DOXAZOSIN MESYLATE 8 MG PO TABS: 8.0000 mg | ORAL_TABLET | Freq: Every day | ORAL | Status: DC

## 2013-03-12 MED ORDER — METFORMIN HCL ER 500 MG PO TB24: 1000.0000 mg | ORAL_TABLET | Freq: Two times a day (BID) | ORAL | Status: DC

## 2013-03-12 MED ORDER — VALSARTAN-HYDROCHLOROTHIAZIDE 320-25 MG PO TABS: 1.0000 | ORAL_TABLET | Freq: Every day | ORAL | Status: DC

## 2013-03-12 NOTE — Progress Notes (Signed)
Pre-visit discussion using our clinic review tool. No additional management support is needed unless otherwise documented below in the visit note.  

## 2013-03-12 NOTE — Progress Notes (Signed)
Subjective:    Patient ID: Ralph Bell, male    DOB: 1940/08/06, 72 y.o.   MRN: [id number]  HPI Pre-visit discussion using our clinic review tool. No additional management support is needed unless otherwise documented below in the visit note.  72 year old patient who is seen today for followup of type 2 diabetes. This has been under excellent control on metformin therapy alone. Last hemoglobin A1c 6.1 He has essential hypertension which also has been stable. Remains on simvastatin 40 mg the 2 dyslipidemia. He has testosterone deficiency but this continued topical replacement therapy about one month ago. He has been on this medication over one year and has not noted any benefit. He wishes to hold this medication for the next several months and to assess how he does off the medication  Past Medical History  Diagnosis Date  . ALLERGIC RHINITIS 11/09/2006  . ASTHMA 11/16/2006  . DIABETES MELLITUS, TYPE II 11/09/2006  . DIVERTICULITIS, HX OF 11/16/2006  . HYPERLIPIDEMIA 11/09/2006  . HYPERTENSION 11/09/2006  . ISCHEMIA 08/20/2008  . Shortness of breath 09/05/2008  . ED (erectile dysfunction)   . Rosacea   . Family hx colonic polyps   . History of kidney stones     History   Social History  . Marital Status: Married    Spouse Name: N/A    Number of Children: N/A  . Years of Education: N/A   Occupational History  . Not on file.   Social History Main Topics  . Smoking status: Former Smoker    Quit date: 04/05/1973  . Smokeless tobacco: Former Systems developer    Quit date: 04/05/1978  . Alcohol Use: Yes  . Drug Use: No  . Sexual Activity: Not on file   Other Topics Concern  . Not on file   Social History Narrative  . No narrative on file    History reviewed. No pertinent past surgical history.  No family history on file.  Allergies  Allergen Reactions  . Sulfacetamide Sodium     Current Outpatient Prescriptions on File Prior to Visit  Medication Sig Dispense Refill  . aspirin 81  MG tablet Take 81 mg by mouth daily.        [name] Kitchen glucose blood (ACCU-CHEK AVIVA) test strip Use daily as directed  100 each  6  . sildenafil (VIAGRA) 50 MG tablet Take 1 tablet (50 mg total) by mouth daily as needed. As directed  10 tablet  6  . Testosterone (ANDROGEL PUMP) 20.25 MG/ACT (1.62%) GEL Place 2 application onto the skin daily.  225 g  1   No current facility-administered medications on file prior to visit.    BP 136/80  Pulse 47  Temp(Src) 97.7 F (36.5 C) (Oral)  Resp 20  Wt 200 lb (90.719 kg)  SpO2 98%       Review of Systems  Constitutional: Negative for fever, chills, appetite change and fatigue.  HENT: Negative for congestion, dental problem, ear pain, hearing loss, sore throat, tinnitus, trouble swallowing and voice change.   Eyes: Negative for pain, discharge and visual disturbance.  Respiratory: Negative for cough, chest tightness, wheezing and stridor.   Cardiovascular: Negative for chest pain, palpitations and leg swelling.  Gastrointestinal: Negative for nausea, vomiting, abdominal pain, diarrhea, constipation, blood in stool and abdominal distention.  Genitourinary: Negative for urgency, hematuria, flank pain, discharge, difficulty urinating and genital sores.  Musculoskeletal: Negative for arthralgias, back pain, gait problem, joint swelling, myalgias and neck stiffness.  Skin: Negative for rash.  Neurological: Negative for dizziness, syncope, speech difficulty, weakness, numbness and headaches.  Hematological: Negative for adenopathy. Does not bruise/bleed easily.  Psychiatric/Behavioral: Negative for behavioral problems and dysphoric mood. The patient is not nervous/anxious.        Objective:   Physical Exam  Constitutional: He is oriented to person, place, and time. He appears well-developed.  HENT:  Head: Normocephalic.  Right Ear: External ear normal.  Left Ear: External ear normal.  Eyes: Conjunctivae and EOM are normal.  Neck: Normal range of  motion.  Cardiovascular: Normal rate and normal heart sounds.   Pulmonary/Chest: Breath sounds normal.  Abdominal: Bowel sounds are normal.  Musculoskeletal: Normal range of motion. He exhibits no edema and no tenderness.  Neurological: He is alert and oriented to person, place, and time.  Psychiatric: He has a normal mood and affect. His behavior is normal.          Assessment & Plan:   Diabetes mellitus. Under tight control. We'll check a followup hemoglobin A1c. Recheck in 4 months Hypertension well controlled. Continue present regimen Dyslipidemia. Continue simvastatin 40 Testosterone deficiency. We'll continue to hold his medication and reassess in 4 months

## 2013-03-12 NOTE — Patient Instructions (Signed)
Limit your sodium (Salt) intake   Please check your hemoglobin A1c every 3 months    It is important that you exercise regularly, at least 20 minutes 3 to 4 times per week.  If you develop chest pain or shortness of breath seek  medical attention.   

## 2013-03-14 ENCOUNTER — Encounter: Payer: Self-pay | Admitting: Internal Medicine

## 2013-07-25 ENCOUNTER — Ambulatory Visit: Payer: Medicare Other | Admitting: Internal Medicine

## 2013-07-27 ENCOUNTER — Other Ambulatory Visit (INDEPENDENT_AMBULATORY_CARE_PROVIDER_SITE_OTHER): Payer: Medicare Other

## 2013-07-27 DIAGNOSIS — E119 Type 2 diabetes mellitus without complications: Secondary | ICD-10-CM

## 2013-07-27 DIAGNOSIS — Z0000 Encounter for general adult medical examination without abnormal findings: Secondary | ICD-10-CM

## 2013-07-27 DIAGNOSIS — R972 Elevated prostate specific antigen [PSA]: Secondary | ICD-10-CM

## 2013-07-27 LAB — POCT URINALYSIS DIPSTICK
Bilirubin, UA: NEGATIVE
Blood, UA: NEGATIVE
Glucose, UA: NEGATIVE
Ketones, UA: NEGATIVE
Leukocytes, UA: NEGATIVE
Nitrite, UA: NEGATIVE
Spec Grav, UA: 1.02
Urobilinogen, UA: 0.2
pH, UA: 5.5

## 2013-07-27 LAB — BASIC METABOLIC PANEL
BUN: 36 mg/dL — ABNORMAL HIGH (ref 6–23)
CO2: 21 mEq/L (ref 19–32)
Calcium: 9.4 mg/dL (ref 8.4–10.5)
Chloride: 105 mEq/L (ref 96–112)
Creatinine, Ser: 1.3 mg/dL (ref 0.4–1.5)
GFR: 55.67 mL/min — ABNORMAL LOW (ref >60.00)
Glucose, Bld: 135 mg/dL — ABNORMAL HIGH (ref 70–99)
Potassium: 4.8 mEq/L (ref 3.5–5.1)
Sodium: 136 mEq/L (ref 135–145)

## 2013-07-27 LAB — MICROALBUMIN / CREATININE URINE RATIO
Creatinine,U: 136.8 mg/dL
Microalb Creat Ratio: 11.4 mg/g (ref 0.0–30.0)
Microalb, Ur: 15.6 mg/dL — ABNORMAL HIGH (ref 0.0–1.9)

## 2013-07-27 LAB — LIPID PANEL
Cholesterol: 118 mg/dL (ref 0–200)
HDL: 36.4 mg/dL — ABNORMAL LOW (ref >39.00)
LDL Cholesterol: 59 mg/dL (ref 0–99)
Total CHOL/HDL Ratio: 3
Triglycerides: 113 mg/dL (ref 0.0–149.0)
VLDL: 22.6 mg/dL (ref 0.0–40.0)

## 2013-07-27 LAB — HEPATIC FUNCTION PANEL
ALT: 21 U/L (ref 0–53)
AST: 18 U/L (ref 0–37)
Albumin: 4 g/dL (ref 3.5–5.2)
Alkaline Phosphatase: 64 U/L (ref 39–117)
Bilirubin, Direct: 0.1 mg/dL (ref 0.0–0.3)
Total Bilirubin: 0.6 mg/dL (ref 0.3–1.2)
Total Protein: 6.9 g/dL (ref 6.0–8.3)

## 2013-07-27 LAB — CBC WITH DIFFERENTIAL/PLATELET
Basophils Absolute: 0 10*3/uL (ref 0.0–0.1)
Basophils Relative: 0.6 % (ref 0.0–3.0)
Eosinophils Absolute: 0.4 10*3/uL (ref 0.0–0.7)
Eosinophils Relative: 5.4 % — ABNORMAL HIGH (ref 0.0–5.0)
HCT: 41.6 % (ref 39.0–52.0)
Hemoglobin: 13.8 g/dL (ref 13.0–17.0)
Lymphocytes Relative: 23.8 % (ref 12.0–46.0)
Lymphs Abs: 1.7 10*3/uL (ref 0.7–4.0)
MCHC: 33.1 g/dL (ref 30.0–36.0)
MCV: 88.6 fl (ref 78.0–100.0)
Monocytes Absolute: 0.6 10*3/uL (ref 0.1–1.0)
Monocytes Relative: 8.1 % (ref 3.0–12.0)
Neutro Abs: 4.4 10*3/uL (ref 1.4–7.7)
Neutrophils Relative %: 62.1 % (ref 43.0–77.0)
Platelets: 162 10*3/uL (ref 150.0–400.0)
RBC: 4.7 Mil/uL (ref 4.22–5.81)
RDW: 13.7 % (ref 11.5–14.6)
WBC: 7.1 10*3/uL (ref 4.5–10.5)

## 2013-07-27 LAB — TSH: TSH: 1.55 u[IU]/mL (ref 0.35–5.50)

## 2013-07-27 LAB — HEMOGLOBIN A1C: Hgb A1c MFr Bld: 6.8 % — ABNORMAL HIGH (ref 4.6–6.5)

## 2013-07-27 LAB — PSA: PSA: 1.36 ng/mL (ref 0.10–4.00)

## 2013-08-02 ENCOUNTER — Ambulatory Visit (INDEPENDENT_AMBULATORY_CARE_PROVIDER_SITE_OTHER): Payer: Medicare Other | Admitting: Internal Medicine

## 2013-08-02 ENCOUNTER — Encounter: Payer: Self-pay | Admitting: Internal Medicine

## 2013-08-02 VITALS — BP 150/74 | HR 58 | Temp 98.2°F | Resp 20 | Ht 67.5 in | Wt 203.0 lb

## 2013-08-02 DIAGNOSIS — I10 Essential (primary) hypertension: Secondary | ICD-10-CM

## 2013-08-02 DIAGNOSIS — E119 Type 2 diabetes mellitus without complications: Secondary | ICD-10-CM

## 2013-08-02 DIAGNOSIS — E785 Hyperlipidemia, unspecified: Secondary | ICD-10-CM

## 2013-08-02 DIAGNOSIS — E291 Testicular hypofunction: Secondary | ICD-10-CM

## 2013-08-02 DIAGNOSIS — E349 Endocrine disorder, unspecified: Secondary | ICD-10-CM

## 2013-08-02 DIAGNOSIS — Z0000 Encounter for general adult medical examination without abnormal findings: Secondary | ICD-10-CM

## 2013-08-02 DIAGNOSIS — J309 Allergic rhinitis, unspecified: Secondary | ICD-10-CM

## 2013-08-02 DIAGNOSIS — M109 Gout, unspecified: Secondary | ICD-10-CM

## 2013-08-02 NOTE — Patient Instructions (Signed)
Limit your sodium (Salt) intake    It is important that you exercise regularly, at least 20 minutes 3 to 4 times per week.  If you develop chest pain or shortness of breath seek  medical attention.  You need to lose weight.  Consider a lower calorie diet and regular exercise.  Return in 6 months for follow-up   

## 2013-08-02 NOTE — Progress Notes (Signed)
Pre-visit discussion using our clinic review tool. No additional management support is needed unless otherwise documented below in the visit note.  

## 2013-08-02 NOTE — Progress Notes (Signed)
Patient ID: Ralph Bell, male   DOB: [birth year], 73 y.o.   MRN: [id number]  Subjective:    Patient ID: Ralph Bell, male    DOB: 03-Nov-1940, 73 y.o.   MRN: [id number]  HPI  73  year-old patient who is seen today for a preventive health examination  He  has a history of type 2 diabetes which has been controlled on metformin therapy. He has dyslipidemia and hypertension. He denies any cardiopulmonary complaints. He has a history of screening colonoscopy in 2008.    Hydrochlorothiazide has been discontinued due 2 probable gout and mild hyperuricemia.  Colonoscopy 2013  Wt Readings from Last 3 Encounters:  08/02/13 203 lb (92.08 kg)  03/12/13 200 lb (90.719 kg)  11/08/12 200 lb (90.719 kg)    Smoking Status: quit  Year Quit: 30 yrs ago   Allergies:  1) ! Sulf-10   Past History:  Past Medical History:   Diabetes mellitus, type II  Hyperlipidemia  Hypertension  Allergic rhinitis  colonic polyps  kidney stones  diverticulosis  dyslipidemia  High Cholesterol  Asthma  Diverticulitis, hx of  rosace  ED  Past Surgical History:   Denies surgical history  colonoscopy March 2008   Family History:   father died at 54 of lung cancer. Also had prostate cancer  mother died at 34. Hypertension, multiple myeloma  one sister living and well;   colonoscopy 2013   Social History:   Occupation: Manufacturing engineer  Married  Never Smoked  Alcohol use-yes  Drug use-no  Regular exercise-no  Smoking Status: quit     1. Risk factors, based on past M,S,F history- cardiovascular risk factors include diabetes hypertension and dyslipidemia  2. Physical activities: No activity restrictions  3. Depression/mood: No history of depression or mood disorder  4. Hearing: No deficits  5. ADL's: Independent in a [place] of daily living  6. Fall risk: Low  7. Home safety: No problems identified  8. Height weight, and visual acuity; height and weight are stable no change in visual  acuity  9. Counseling: Follow up colonoscopy in 2013  10. Lab orders based on risk factors: Laboratory studies were reviewed  11. Referral: Not appropriate at this time has had a recent eye examination  12. Care plan: The patient will have a repeat PSA performed in 3 months at the time of his next hemoglobin A1c  13. Cognitive assessment: Alert and oriented with normal affect   Past Medical History  Diagnosis Date  . ALLERGIC RHINITIS 11/09/2006  . ASTHMA 11/16/2006  . DIABETES MELLITUS, TYPE II 11/09/2006  . DIVERTICULITIS, HX OF 11/16/2006  . HYPERLIPIDEMIA 11/09/2006  . HYPERTENSION 11/09/2006  . ISCHEMIA 08/20/2008  . Shortness of breath 09/05/2008  . ED (erectile dysfunction)   . Rosacea   . Family hx colonic polyps   . History of kidney stones     History   Social History  . Marital Status: Married    Spouse Name: N/A    Number of Children: N/A  . Years of Education: N/A   Occupational History  . Not on file.   Social History Main Topics  . Smoking status: Former Smoker    Quit date: 04/05/1973  . Smokeless tobacco: Former Systems developer    Quit date: 04/05/1978  . Alcohol Use: Yes  . Drug Use: No  . Sexual Activity: Not on file   Other Topics Concern  . Not on file   Social History Narrative  . No narrative on file  History reviewed. No pertinent past surgical history.  No family history on file.  Allergies  Allergen Reactions  . Sulfacetamide Sodium     Current Outpatient Prescriptions on File Prior to Visit  Medication Sig Dispense Refill  . allopurinol (ZYLOPRIM) 100 MG tablet Take 1 tablet (100 mg total) by mouth daily.  90 tablet  3  . aspirin 81 MG tablet Take 81 mg by mouth daily.        [name] Kitchen doxazosin (CARDURA) 8 MG tablet Take 1 tablet (8 mg total) by mouth at bedtime.  90 tablet  3  . fexofenadine (ALLEGRA) 180 MG tablet Take 1 tablet (180 mg total) by mouth daily.  90 tablet  3  . glucose blood (ACCU-CHEK AVIVA) test strip Use daily as directed  100 each  6   . metFORMIN (GLUCOPHAGE-XR) 500 MG 24 hr tablet Take 2 tablets (1,000 mg total) by mouth 2 (two) times daily with a meal.  360 tablet  3  . sildenafil (VIAGRA) 50 MG tablet Take 1 tablet (50 mg total) by mouth daily as needed. As directed  10 tablet  6  . simvastatin (ZOCOR) 40 MG tablet Take 1 tablet (40 mg total) by mouth daily.  90 tablet  3  . Testosterone (ANDROGEL PUMP) 20.25 MG/ACT (1.62%) GEL Place 2 application onto the skin daily.  225 g  1  . valsartan-hydrochlorothiazide (DIOVAN-HCT) 320-25 MG per tablet Take 1 tablet by mouth daily.  90 tablet  3   No current facility-administered medications on file prior to visit.    There were no vitals taken for this visit.    Results for orders placed in visit on 07/27/13  CBC WITH DIFFERENTIAL      Result Value Ref Range   WBC 7.1  4.5 - 10.5 K/uL   RBC 4.70  4.22 - 5.81 Mil/uL   Hemoglobin 13.8  13.0 - 17.0 g/dL   HCT 41.6  39.0 - 52.0 %   MCV 88.6  78.0 - 100.0 fl   MCHC 33.1  30.0 - 36.0 g/dL   RDW 13.7  11.5 - 14.6 %   Platelets 162.0  150.0 - 400.0 K/uL   Neutrophils Relative % 62.1  43.0 - 77.0 %   Lymphocytes Relative 23.8  12.0 - 46.0 %   Monocytes Relative 8.1  3.0 - 12.0 %   Eosinophils Relative 5.4 (*) 0.0 - 5.0 %   Basophils Relative 0.6  0.0 - 3.0 %   Neutro Abs 4.4  1.4 - 7.7 K/uL   Lymphs Abs 1.7  0.7 - 4.0 K/uL   Monocytes Absolute 0.6  0.1 - 1.0 K/uL   Eosinophils Absolute 0.4  0.0 - 0.7 K/uL   Basophils Absolute 0.0  0.0 - 0.1 K/uL  BASIC METABOLIC PANEL      Result Value Ref Range   Sodium 136  135 - 145 mEq/L   Potassium 4.8  3.5 - 5.1 mEq/L   Chloride 105  96 - 112 mEq/L   CO2 21  19 - 32 mEq/L   Glucose, Bld 135 (*) 70 - 99 mg/dL   BUN 36 (*) 6 - 23 mg/dL   Creatinine, Ser 1.3  0.4 - 1.5 mg/dL   Calcium 9.4  8.4 - 10.5 mg/dL   GFR 55.67 (*) >60.00 mL/min  LIPID PANEL      Result Value Ref Range   Cholesterol 118  0 - 200 mg/dL   Triglycerides 113.0  0.0 - 149.0 mg/dL   HDL  36.40 (*) >39.00  mg/dL   VLDL 22.6  0.0 - 40.0 mg/dL   LDL Cholesterol 59  0 - 99 mg/dL   Total CHOL/HDL Ratio 3    PSA      Result Value Ref Range   PSA 1.36  0.10 - 4.00 ng/mL  TSH      Result Value Ref Range   TSH 1.55  0.35 - 5.50 uIU/mL  HEPATIC FUNCTION PANEL      Result Value Ref Range   Total Bilirubin 0.6  0.3 - 1.2 mg/dL   Bilirubin, Direct 0.1  0.0 - 0.3 mg/dL   Alkaline Phosphatase 64  39 - 117 U/L   AST 18  0 - 37 U/L   ALT 21  0 - 53 U/L   Total Protein 6.9  6.0 - 8.3 g/dL   Albumin 4.0  3.5 - 5.2 g/dL  HEMOGLOBIN A1C      Result Value Ref Range   Hemoglobin A1C 6.8 (*) 4.6 - 6.5 %  MICROALBUMIN / CREATININE URINE RATIO      Result Value Ref Range   Microalb, Ur 15.6 (*) 0.0 - 1.9 mg/dL   Creatinine,U 136.8     Microalb Creat Ratio 11.4  0.0 - 30.0 mg/g  POCT URINALYSIS DIPSTICK      Result Value Ref Range   Color, UA yellow     Clarity, UA clear     Glucose, UA n     Bilirubin, UA n     Ketones, UA n     Spec Grav, UA 1.020     Blood, UA n     pH, UA 5.5     Protein, UA 1+     Urobilinogen, UA 0.2     Nitrite, UA n     Leukocytes, UA Negative         Review of Systems  Constitutional: Negative for fever, chills, activity change, appetite change and fatigue.  HENT: Negative for congestion, dental problem, ear pain, hearing loss, mouth sores, rhinorrhea, sinus pressure, sneezing, tinnitus, trouble swallowing and voice change.   Eyes: Negative for photophobia, pain, redness and visual disturbance.  Respiratory: Negative for apnea, cough, choking, chest tightness, shortness of breath and wheezing.   Cardiovascular: Negative for chest pain, palpitations and leg swelling.  Gastrointestinal: Negative for nausea, vomiting, abdominal pain, diarrhea, constipation, blood in stool, abdominal distention, anal bleeding and rectal pain.  Genitourinary: Negative for dysuria, urgency, frequency, hematuria, flank pain, decreased urine volume, discharge, penile swelling, scrotal  swelling, difficulty urinating, genital sores and testicular pain.  Musculoskeletal: Negative for arthralgias, back pain, gait problem, joint swelling, myalgias, neck pain and neck stiffness.  Skin: Negative for color change, rash and wound.  Neurological: Negative for dizziness, tremors, seizures, syncope, facial asymmetry, speech difficulty, weakness, light-headedness, numbness and headaches.  Hematological: Negative for adenopathy. Does not bruise/bleed easily.  Psychiatric/Behavioral: Negative for suicidal ideas, hallucinations, behavioral problems, confusion, sleep disturbance, self-injury, dysphoric mood, decreased concentration and agitation. The patient is not nervous/anxious.        Objective:   Physical Exam  Constitutional: He appears well-developed and well-nourished.  HENT:  Head: Normocephalic and atraumatic.  Right Ear: External ear normal.  Left Ear: External ear normal.  Nose: Nose normal.  Mouth/Throat: Oropharynx is clear and moist.  Eyes: Conjunctivae and EOM are normal. Pupils are equal, round, and reactive to light. No scleral icterus.  Neck: Normal range of motion. Neck supple. No JVD present. No thyromegaly present.  Cardiovascular: Regular  rhythm, normal heart sounds and intact distal pulses.  Exam reveals no gallop and no friction rub.   No murmur heard. Pulmonary/Chest: Effort normal and breath sounds normal. He exhibits no tenderness.  Abdominal: Soft. Bowel sounds are normal. He exhibits no distension and no mass. There is no tenderness.  Genitourinary: Penis normal. Guaiac negative stool.  Prostate +2 enlarged smooth and symmetrical  Musculoskeletal: Normal range of motion. He exhibits no edema and no tenderness.  Lymphadenopathy:    He has no cervical adenopathy.  Neurological: He is alert. He has normal reflexes. No cranial nerve deficit. Coordination normal.  Skin: Skin is warm and dry. No rash noted.  Psychiatric: He has a normal mood and affect. His  behavior is normal.          Assessment & Plan:    Preventive health examination Diabetes well controlled Hypertension- continue hydrochlorothiazide and increase Cardura to 8 mg daily Mild BPH Gout/hyperuricemia will continue allopurinol 100 mg daily.   Recheck 3 months

## 2013-08-03 ENCOUNTER — Telehealth: Payer: Self-pay | Admitting: Internal Medicine

## 2013-08-03 NOTE — Telephone Encounter (Signed)
Relevant patient education assigned to patient using Emmi. ° °

## 2013-08-13 ENCOUNTER — Telehealth: Payer: Self-pay

## 2013-08-13 NOTE — Telephone Encounter (Signed)
Relevant patient education assigned to patient using Emmi. ° °

## 2013-12-11 ENCOUNTER — Telehealth: Payer: Self-pay | Admitting: Internal Medicine

## 2013-12-11 DIAGNOSIS — D229 Melanocytic nevi, unspecified: Secondary | ICD-10-CM

## 2013-12-11 NOTE — Telephone Encounter (Signed)
ok 

## 2013-12-11 NOTE — Telephone Encounter (Signed)
Pt requesting referral for skin surgery, he has several moles that need to be evaluated and removed.

## 2013-12-11 NOTE — Telephone Encounter (Signed)
Spoke to pt, told him will send referral for Dermatology and our referral coordinator will contact him. Pt said I do not need referral just thought Dr. Raliegh Ip could recommend someone.   Called pt back and told him Dr.K said [facility] or [facility]. Pt verbalized understanding.

## 2013-12-11 NOTE — Telephone Encounter (Signed)
Okay to do referral to Dermatology?

## 2013-12-17 ENCOUNTER — Ambulatory Visit (INDEPENDENT_AMBULATORY_CARE_PROVIDER_SITE_OTHER): Payer: Medicare Other | Admitting: *Deleted

## 2013-12-17 VITALS — BP 112/70

## 2013-12-17 DIAGNOSIS — I10 Essential (primary) hypertension: Secondary | ICD-10-CM

## 2013-12-17 NOTE — Progress Notes (Signed)
Pt comes in today for a nurse visit BP check for the diabetic bundle.  BP was 112/70.  He is taking diovan.  He states his glucometer broke and he would like a new one.  Gave pt new glucometer and told him to continue current medications.  He has a follow up with Dr Raliegh Ip on 01/24/14 for his diabetic follow up.  Instructed pt keep appt.

## 2014-02-01 ENCOUNTER — Ambulatory Visit (INDEPENDENT_AMBULATORY_CARE_PROVIDER_SITE_OTHER): Payer: Medicare Other | Admitting: Internal Medicine

## 2014-02-01 ENCOUNTER — Encounter: Payer: Self-pay | Admitting: Internal Medicine

## 2014-02-01 VITALS — BP 122/80 | HR 54 | Temp 97.5°F | Resp 20 | Ht 67.5 in | Wt 200.0 lb

## 2014-02-01 DIAGNOSIS — Z23 Encounter for immunization: Secondary | ICD-10-CM

## 2014-02-01 DIAGNOSIS — I10 Essential (primary) hypertension: Secondary | ICD-10-CM

## 2014-02-01 DIAGNOSIS — E0821 Diabetes mellitus due to underlying condition with diabetic nephropathy: Secondary | ICD-10-CM

## 2014-02-01 LAB — HEMOGLOBIN A1C: Hgb A1c MFr Bld: 6.6 % — ABNORMAL HIGH (ref 4.6–6.5)

## 2014-02-01 MED ORDER — SIMVASTATIN 40 MG PO TABS: 40.0000 mg | ORAL_TABLET | Freq: Every day | ORAL | Status: DC

## 2014-02-01 MED ORDER — FEXOFENADINE HCL 180 MG PO TABS: 180.0000 mg | ORAL_TABLET | Freq: Every day | ORAL | Status: DC

## 2014-02-01 MED ORDER — VALSARTAN-HYDROCHLOROTHIAZIDE 320-25 MG PO TABS: 1.0000 | ORAL_TABLET | Freq: Every day | ORAL | Status: DC

## 2014-02-01 MED ORDER — DOXAZOSIN MESYLATE 8 MG PO TABS: 8.0000 mg | ORAL_TABLET | Freq: Every day | ORAL | Status: DC

## 2014-02-01 MED ORDER — METFORMIN HCL ER 500 MG PO TB24: 1000.0000 mg | ORAL_TABLET | Freq: Two times a day (BID) | ORAL | Status: DC

## 2014-02-01 MED ORDER — ALLOPURINOL 100 MG PO TABS: 100.0000 mg | ORAL_TABLET | Freq: Every day | ORAL | Status: DC

## 2014-02-01 NOTE — Progress Notes (Signed)
Subjective:    Patient ID: Ralph Bell, male    DOB: Jul 23, 1940, 73 y.o.   MRN: [id number]  HPI Wt Readings from Last 3 Encounters:  02/01/14 200 lb (90.719 kg)  08/02/13 203 lb (92.08 kg)  03/12/13 200 lb (90.48 kg)   73 year old patient who has type 2 diabetes.  He is seen today for his biannual follow-up.  Hemoglobin A1c's have always been less than 7.  He has essential hypertension and dyslipidemia.  Continues to do well without cardiopulmonary complaints. Prostate cancer with PSA screening.  Discussed at length.  Father did die of complications of prostate cancer  Past Medical History  Diagnosis Date  . ALLERGIC RHINITIS 11/09/2006  . ASTHMA 11/16/2006  . DIABETES MELLITUS, TYPE II 11/09/2006  . DIVERTICULITIS, HX OF 11/16/2006  . HYPERLIPIDEMIA 11/09/2006  . HYPERTENSION 11/09/2006  . ISCHEMIA 08/20/2008  . Shortness of breath 09/05/2008  . ED (erectile dysfunction)   . Rosacea   . Family hx colonic polyps   . History of kidney stones     History   Social History  . Marital Status: Married    Spouse Name: N/A    Number of Children: N/A  . Years of Education: N/A   Occupational History  . Not on file.   Social History Main Topics  . Smoking status: Former Smoker    Quit date: 04/05/1973  . Smokeless tobacco: Former Systems developer    Quit date: 04/05/1978  . Alcohol Use: Yes  . Drug Use: No  . Sexual Activity: Not on file   Other Topics Concern  . Not on file   Social History Narrative  . No narrative on file    History reviewed. No pertinent past surgical history.  No family history on file.  Allergies  Allergen Reactions  . Sulfacetamide Sodium     Current Outpatient Prescriptions on File Prior to Visit  Medication Sig Dispense Refill  . aspirin 81 MG tablet Take 81 mg by mouth daily.        [name] Kitchen glucose blood (ACCU-CHEK AVIVA) test strip Use daily as directed  100 each  6  . sildenafil (VIAGRA) 50 MG tablet Take 1 tablet (50 mg total) by mouth daily as needed.  As directed  10 tablet  6   No current facility-administered medications on file prior to visit.    BP 122/80  Pulse 54  Temp(Src) 97.5 F (36.4 C) (Oral)  Resp 20  Ht 5' 7.5" (1.715 m)  Wt 200 lb (90.719 kg)  BMI 30.84 kg/m2  SpO2 98%     Review of Systems  Constitutional: Negative for fever, chills, appetite change and fatigue.  HENT: Negative for congestion, dental problem, ear pain, hearing loss, sore throat, tinnitus, trouble swallowing and voice change.   Eyes: Negative for pain, discharge and visual disturbance.  Respiratory: Negative for cough, chest tightness, wheezing and stridor.   Cardiovascular: Negative for chest pain, palpitations and leg swelling.  Gastrointestinal: Negative for nausea, vomiting, abdominal pain, diarrhea, constipation, blood in stool and abdominal distention.  Genitourinary: Negative for urgency, hematuria, flank pain, discharge, difficulty urinating and genital sores.  Musculoskeletal: Negative for arthralgias, back pain, gait problem, joint swelling, myalgias and neck stiffness.  Skin: Negative for rash.  Neurological: Negative for dizziness, syncope, speech difficulty, weakness, numbness and headaches.  Hematological: Negative for adenopathy. Does not bruise/bleed easily.  Psychiatric/Behavioral: Negative for behavioral problems and dysphoric mood. The patient is not nervous/anxious.        Objective:  Physical Exam  Constitutional: He is oriented to person, place, and time. He appears well-developed.  Blood pressure well controlled Weight 200  HENT:  Head: Normocephalic.  Right Ear: External ear normal.  Left Ear: External ear normal.  Eyes: Conjunctivae and EOM are normal.  Neck: Normal range of motion.  Cardiovascular: Normal rate and normal heart sounds.   Pulmonary/Chest: Breath sounds normal.  Abdominal: Bowel sounds are normal.  Musculoskeletal: Normal range of motion. He exhibits no edema and no tenderness.  Neurological:  He is alert and oriented to person, place, and time.  Psychiatric: He has a normal mood and affect. His behavior is normal.          Assessment & Plan:   Diabetes mellitus.  Will check a hemoglobin A1c Obesity.  Weight loss encouraged Hypertension, well-controlled  CPX 6 months

## 2014-02-01 NOTE — Patient Instructions (Signed)
Limit your sodium (Salt) intake   Please check your hemoglobin A1c every 3 months Return in 6 months for follow-up

## 2014-02-01 NOTE — Progress Notes (Signed)
Pre visit review using our clinic review tool, if applicable. No additional management support is needed unless otherwise documented below in the visit note. 

## 2014-05-15 ENCOUNTER — Telehealth: Payer: Self-pay | Admitting: Internal Medicine

## 2014-05-15 MED ORDER — AZITHROMYCIN 250 MG PO TABS: ORAL_TABLET | ORAL | Status: DC

## 2014-05-15 NOTE — Telephone Encounter (Signed)
Please advise 

## 2014-05-15 NOTE — Telephone Encounter (Signed)
Pt grandson was dx with whooping cough and per pt wife health department said anyone that had contact with grandson needs zpak. Pt is not having any symptoms. [facility] [address]

## 2014-05-15 NOTE — Telephone Encounter (Signed)
Ok for z pack

## 2014-05-15 NOTE — Telephone Encounter (Signed)
Tamesha,  Will you call this in for pt? They are anxious to get started.

## 2014-05-15 NOTE — Telephone Encounter (Signed)
Done

## 2014-08-05 ENCOUNTER — Other Ambulatory Visit (INDEPENDENT_AMBULATORY_CARE_PROVIDER_SITE_OTHER): Payer: Medicare Other

## 2014-08-05 ENCOUNTER — Other Ambulatory Visit: Payer: Medicare Other | Admitting: Internal Medicine

## 2014-08-05 DIAGNOSIS — I10 Essential (primary) hypertension: Secondary | ICD-10-CM

## 2014-08-05 DIAGNOSIS — Z0000 Encounter for general adult medical examination without abnormal findings: Secondary | ICD-10-CM

## 2014-08-05 DIAGNOSIS — Z125 Encounter for screening for malignant neoplasm of prostate: Secondary | ICD-10-CM

## 2014-08-05 DIAGNOSIS — E785 Hyperlipidemia, unspecified: Secondary | ICD-10-CM

## 2014-08-05 LAB — HEPATIC FUNCTION PANEL
ALT: 20 U/L (ref 0–53)
AST: 17 U/L (ref 0–37)
Albumin: 3.9 g/dL (ref 3.5–5.2)
Alkaline Phosphatase: 61 U/L (ref 39–117)
Bilirubin, Direct: 0.1 mg/dL (ref 0.0–0.3)
Total Bilirubin: 0.4 mg/dL (ref 0.2–1.2)
Total Protein: 6.7 g/dL (ref 6.0–8.3)

## 2014-08-05 LAB — BASIC METABOLIC PANEL
BUN: 39 mg/dL — ABNORMAL HIGH (ref 6–23)
CO2: 24 mEq/L (ref 19–32)
Calcium: 9 mg/dL (ref 8.4–10.5)
Chloride: 106 mEq/L (ref 96–112)
Creatinine, Ser: 1.67 mg/dL — ABNORMAL HIGH (ref 0.40–1.50)
GFR: 43.05 mL/min — ABNORMAL LOW (ref >60.00)
Glucose, Bld: 108 mg/dL — ABNORMAL HIGH (ref 70–99)
Potassium: 4.5 mEq/L (ref 3.5–5.1)
Sodium: 139 mEq/L (ref 135–145)

## 2014-08-05 LAB — POCT URINALYSIS DIPSTICK
Bilirubin, UA: NEGATIVE
Blood, UA: NEGATIVE
Glucose, UA: NEGATIVE
Ketones, UA: NEGATIVE
Leukocytes, UA: NEGATIVE
Nitrite, UA: NEGATIVE
Protein, UA: NEGATIVE
Spec Grav, UA: 1.015
Urobilinogen, UA: 0.2
pH, UA: 5

## 2014-08-05 LAB — CBC WITH DIFFERENTIAL/PLATELET
Basophils Absolute: 0 10*3/uL (ref 0.0–0.1)
Basophils Relative: 0.5 % (ref 0.0–3.0)
Eosinophils Absolute: 0.3 10*3/uL (ref 0.0–0.7)
Eosinophils Relative: 4.1 % (ref 0.0–5.0)
HCT: 41 % (ref 39.0–52.0)
Hemoglobin: 13.7 g/dL (ref 13.0–17.0)
Lymphocytes Relative: 24 % (ref 12.0–46.0)
Lymphs Abs: 1.8 10*3/uL (ref 0.7–4.0)
MCHC: 33.3 g/dL (ref 30.0–36.0)
MCV: 88.1 fl (ref 78.0–100.0)
Monocytes Absolute: 0.6 10*3/uL (ref 0.1–1.0)
Monocytes Relative: 8.3 % (ref 3.0–12.0)
Neutro Abs: 4.7 10*3/uL (ref 1.4–7.7)
Neutrophils Relative %: 63.1 % (ref 43.0–77.0)
Platelets: 153 10*3/uL (ref 150.0–400.0)
RBC: 4.66 Mil/uL (ref 4.22–5.81)
RDW: 14.6 % (ref 11.5–15.5)
WBC: 7.4 10*3/uL (ref 4.0–10.5)

## 2014-08-05 LAB — LIPID PANEL
Cholesterol: 114 mg/dL (ref 0–200)
HDL: 36.3 mg/dL — ABNORMAL LOW (ref >39.00)
LDL Cholesterol: 56 mg/dL (ref 0–99)
NonHDL: 77.7
Total CHOL/HDL Ratio: 3
Triglycerides: 107 mg/dL (ref 0.0–149.0)
VLDL: 21.4 mg/dL (ref 0.0–40.0)

## 2014-08-05 LAB — MICROALBUMIN / CREATININE URINE RATIO
Creatinine,U: 129.1 mg/dL
Microalb Creat Ratio: 2.6 mg/g (ref 0.0–30.0)
Microalb, Ur: 3.3 mg/dL — ABNORMAL HIGH (ref 0.0–1.9)

## 2014-08-05 LAB — PSA: PSA: 1.29 ng/mL (ref 0.10–4.00)

## 2014-08-05 LAB — HEMOGLOBIN A1C: Hgb A1c MFr Bld: 6.4 % (ref 4.6–6.5)

## 2014-08-05 LAB — TSH: TSH: 1.15 u[IU]/mL (ref 0.35–4.50)

## 2014-08-06 DIAGNOSIS — E119 Type 2 diabetes mellitus without complications: Secondary | ICD-10-CM | POA: Diagnosis not present

## 2014-08-06 LAB — HM DIABETES EYE EXAM

## 2014-08-08 ENCOUNTER — Encounter: Payer: Medicare Other | Admitting: Internal Medicine

## 2014-08-09 ENCOUNTER — Encounter: Payer: Medicare Other | Admitting: Internal Medicine

## 2014-08-22 ENCOUNTER — Encounter: Payer: Self-pay | Admitting: Internal Medicine

## 2014-08-22 ENCOUNTER — Ambulatory Visit (INDEPENDENT_AMBULATORY_CARE_PROVIDER_SITE_OTHER): Payer: Medicare Other | Admitting: Internal Medicine

## 2014-08-22 VITALS — BP 140/72 | HR 52 | Temp 97.9°F | Resp 20 | Ht 67.0 in | Wt 203.0 lb

## 2014-08-22 DIAGNOSIS — E0821 Diabetes mellitus due to underlying condition with diabetic nephropathy: Secondary | ICD-10-CM

## 2014-08-22 DIAGNOSIS — I10 Essential (primary) hypertension: Secondary | ICD-10-CM

## 2014-08-22 DIAGNOSIS — Z0000 Encounter for general adult medical examination without abnormal findings: Secondary | ICD-10-CM

## 2014-08-22 DIAGNOSIS — E785 Hyperlipidemia, unspecified: Secondary | ICD-10-CM

## 2014-08-22 MED ORDER — VALSARTAN 320 MG PO TABS: 320.0000 mg | ORAL_TABLET | Freq: Every day | ORAL | Status: DC

## 2014-08-22 NOTE — Patient Instructions (Addendum)
Limit your sodium (Salt) intake  Please check your blood pressure on a regular basis.  If it is consistently greater than 150/90, please make an office appointment.    It is important that you exercise regularly, at least 20 minutes 3 to 4 times per week.  If you develop chest pain or shortness of breath seek  medical attention.  You need to lose weight.  Consider a lower calorie diet and regular exercise.  Return in 6 months for follow-up Health Maintenance A healthy lifestyle and preventative care can promote health and wellness.  Maintain regular health, dental, and eye exams.  Eat a healthy diet. Foods like vegetables, fruits, whole grains, low-fat dairy products, and lean protein foods contain the nutrients you need and are low in calories. Decrease your intake of foods high in solid fats, added sugars, and salt. Get information about a proper diet from your health care provider, if necessary.  Regular physical exercise is one of the most important things you can do for your health. Most adults should get at least 150 minutes of moderate-intensity exercise (any activity that increases your heart rate and causes you to sweat) each week. In addition, most adults need muscle-strengthening exercises on 2 or more days a week.   Maintain a healthy weight. The body mass index (BMI) is a screening tool to identify possible weight problems. It provides an estimate of body fat based on height and weight. Your health care provider can find your BMI and can help you achieve or maintain a healthy weight. For males 20 years and older:  A BMI below 18.5 is considered underweight.  A BMI of 18.5 to 24.9 is normal.  A BMI of 25 to 29.9 is considered overweight.  A BMI of 30 and above is considered obese.  Maintain normal blood lipids and cholesterol by exercising and minimizing your intake of saturated fat. Eat a balanced diet with plenty of fruits and vegetables. Blood tests for lipids and  cholesterol should begin at age 24 and be repeated every 5 years. If your lipid or cholesterol levels are high, you are over age 43, or you are at high risk for heart disease, you may need your cholesterol levels checked more frequently.Ongoing high lipid and cholesterol levels should be treated with medicines if diet and exercise are not working.  If you smoke, find out from your health care provider how to quit. If you do not use tobacco, do not start.  Lung cancer screening is recommended for adults aged 38-80 years who are at high risk for developing lung cancer because of a history of smoking. A yearly low-dose CT scan of the lungs is recommended for people who have at least a 30-pack-year history of smoking and are current smokers or have quit within the past 15 years. A pack year of smoking is smoking an average of 1 pack of cigarettes a day for 1 year (for example, a 30-pack-year history of smoking could mean smoking 1 pack a day for 30 years or 2 packs a day for 15 years). Yearly screening should continue until the smoker has stopped smoking for at least 15 years. Yearly screening should be stopped for people who develop a health problem that would prevent them from having lung cancer treatment.  If you choose to drink alcohol, do not have more than 2 drinks per day. One drink is considered to be 12 oz (360 mL) of beer, 5 oz (150 mL) of wine, or 1.5 oz (45  mL) of liquor.  Avoid the use of street drugs. Do not share needles with anyone. Ask for help if you need support or instructions about stopping the use of drugs.  High blood pressure causes heart disease and increases the risk of stroke. Blood pressure should be checked at least every 1-2 years. Ongoing high blood pressure should be treated with medicines if weight loss and exercise are not effective.  If you are 81-61 years old, ask your health care provider if you should take aspirin to prevent heart disease.  Diabetes screening involves  taking a blood sample to check your fasting blood sugar level. This should be done once every 3 years after age 74 if you are at a normal weight and without risk factors for diabetes. Testing should be considered at a younger age or be carried out more frequently if you are overweight and have at least 1 risk factor for diabetes.  Colorectal cancer can be detected and often prevented. Most routine colorectal cancer screening begins at the age of 12 and continues through age 69. However, your health care provider may recommend screening at an earlier age if you have risk factors for colon cancer. On a yearly basis, your health care provider may provide home test kits to check for hidden blood in the stool. A small camera at the end of a tube may be used to directly examine the colon (sigmoidoscopy or colonoscopy) to detect the earliest forms of colorectal cancer. Talk to your health care provider about this at age 28 when routine screening begins. A direct exam of the colon should be repeated every 5-10 years through age 19, unless early forms of precancerous polyps or small growths are found.  People who are at an increased risk for hepatitis B should be screened for this virus. You are considered at high risk for hepatitis B if:  You were born in a country where hepatitis B occurs often. Talk with your health care provider about which countries are considered high risk.  Your parents were born in a high-risk country and you have not received a shot to protect against hepatitis B (hepatitis B vaccine).  You have HIV or AIDS.  You use needles to inject street drugs.  You live with, or have sex with, someone who has hepatitis B.  You are a man who has sex with other men (MSM).  You get hemodialysis treatment.  You take certain medicines for conditions like cancer, organ transplantation, and autoimmune conditions.  Hepatitis C blood testing is recommended for all people born from [birth year] through 1964  and any individual with known risk factors for hepatitis C.  Healthy men should no longer receive prostate-specific antigen (PSA) blood tests as part of routine cancer screening. Talk to your health care provider about prostate cancer screening.  Testicular cancer screening is not recommended for adolescents or adult males who have no symptoms. Screening includes self-exam, a health care provider exam, and other screening tests. Consult with your health care provider about any symptoms you have or any concerns you have about testicular cancer.  Practice safe sex. Use condoms and avoid high-risk sexual practices to reduce the spread of sexually transmitted infections (STIs).  You should be screened for STIs, including gonorrhea and chlamydia if:  You are sexually active and are younger than 24 years.  You are older than 24 years, and your health care provider tells you that you are at risk for this type of infection.  Your sexual activity  has changed since you were last screened, and you are at an increased risk for chlamydia or gonorrhea. Ask your health care provider if you are at risk.  If you are at risk of being infected with HIV, it is recommended that you take a prescription medicine daily to prevent HIV infection. This is called pre-exposure prophylaxis (PrEP). You are considered at risk if:  You are a man who has sex with other men (MSM).  You are a heterosexual man who is sexually active with multiple partners.  You take drugs by injection.  You are sexually active with a partner who has HIV.  Talk with your health care provider about whether you are at high risk of being infected with HIV. If you choose to begin PrEP, you should first be tested for HIV. You should then be tested every 3 months for as long as you are taking PrEP.  Use sunscreen. Apply sunscreen liberally and repeatedly throughout the day. You should seek shade when your shadow is shorter than you. Protect  yourself by wearing long sleeves, pants, a wide-brimmed hat, and sunglasses year round whenever you are outdoors.  Tell your health care provider of new moles or changes in moles, especially if there is a change in shape or color. Also, tell your health care provider if a mole is larger than the size of a pencil eraser.  A one-time screening for abdominal aortic aneurysm (AAA) and surgical repair of large AAAs by ultrasound is recommended for men aged 26-75 years who are current or former smokers.  Stay current with your vaccines (immunizations). Document Released: 09/18/2007 Document Revised: 03/27/2013 Document Reviewed: 08/17/2010 [facility] Patient Information 2015 [place], [place]. This information is not intended to replace advice given to you by your health care provider. Make sure you discuss any questions you have with your health care provider.

## 2014-08-22 NOTE — Progress Notes (Signed)
Pre visit review using our clinic review tool, if applicable. No additional management support is needed unless otherwise documented below in the visit note. 

## 2014-08-22 NOTE — Progress Notes (Signed)
Patient ID: Ralph Bell, male   DOB: 1941/04/04, 74 y.o.   MRN: [id number]  Subjective:    Patient ID: Ralph Bell, male    DOB: 12-10-1940, 74 y.o.   MRN: [id number]  HPI  74  year-old patient who is seen today for a preventive health examination  He  has a history of type 2 diabetes which has been controlled on metformin therapy. He has dyslipidemia and hypertension. He denies any cardiopulmonary complaints. He has a history of screening colonoscopy in 2008.    Hydrochlorothiazide has been discontinued in the past  due to probable gout and mild hyperuricemia.  Presently is back on hydrochlorothiazide.  Laboratory studies reviewed and did reveal an increase in creatinine.  He does have nocturia times 3 and a history of BPH.  Blood pressure has been well-controlled  Colonoscopy 2013  Wt Readings from Last 3 Encounters:  08/22/14 203 lb (92.08 kg)  02/01/14 200 lb (90.719 kg)  08/02/13 203 lb (92.08 kg)    Smoking Status: quit  Year Quit: 30 yrs ago   Allergies:  1) ! Sulf-10   Past History:  Past Medical History:   Diabetes mellitus, type II  Hyperlipidemia  Hypertension  Allergic rhinitis  colonic polyps  kidney stones  diverticulosis  dyslipidemia  High Cholesterol  Asthma  Diverticulitis, hx of  rosace  ED  Past Surgical History:   Denies surgical history  colonoscopy March 2008   Family History:   father died at 14 of lung cancer. Also had prostate cancer  mother died at 93. Hypertension, multiple myeloma  one sister living and well;   colonoscopy 2013   Social History:   Occupation: Manufacturing engineer  Married  Never Smoked  Alcohol use-yes  Drug use-no  Regular exercise-no  Smoking Status: quit     1. Risk factors, based on past M,S,F history- cardiovascular risk factors include diabetes hypertension and dyslipidemia  2. Physical activities: No activity restrictions  3. Depression/mood: No history of depression or mood disorder  4.  Hearing: No deficits  5. ADL's: Independent in a Lasix of daily living  6. Fall risk: Low  7. Home safety: No problems identified  8. Height weight, and visual acuity; height and weight are stable no change in visual acuity  9. Counseling: Follow up colonoscopy in 2013  10. Lab orders based on risk factors: Laboratory studies were reviewed  11. Referral: Not appropriate at this time has had a recent eye examination  12. Care plan: The patient will have a repeat PSA performed in 3 months at the time of his next hemoglobin A1c  13. Cognitive assessment: Alert and oriented with normal affect  14.  Preventive services will include annual health assessments with screening lab.  He will continue to have annual eye examinations.  Colonoscopies at 5 year intervals. 15. Provider list update includes ophthalmology primary care medicine and GI.   Past Medical History  Diagnosis Date  . ALLERGIC RHINITIS 11/09/2006  . ASTHMA 11/16/2006  . DIABETES MELLITUS, TYPE II 11/09/2006  . DIVERTICULITIS, HX OF 11/16/2006  . HYPERLIPIDEMIA 11/09/2006  . HYPERTENSION 11/09/2006  . ISCHEMIA 08/20/2008  . Shortness of breath 09/05/2008  . ED (erectile dysfunction)   . Rosacea   . Family hx colonic polyps   . History of kidney stones     History   Social History  . Marital Status: Married    Spouse Name: N/A  . Number of Children: N/A  . Years of Education: N/A  Occupational History  . Not on file.   Social History Main Topics  . Smoking status: Former Smoker    Quit date: 04/05/1973  . Smokeless tobacco: Former Systems developer    Quit date: 04/05/1978  . Alcohol Use: Yes  . Drug Use: No  . Sexual Activity: Not on file   Other Topics Concern  . Not on file   Social History Narrative    No past surgical history on file.  No family history on file.  Allergies  Allergen Reactions  . Sulfacetamide Sodium     Current Outpatient Prescriptions on File Prior to Visit  Medication Sig Dispense Refill  .  allopurinol (ZYLOPRIM) 100 MG tablet Take 1 tablet (100 mg total) by mouth daily. 90 tablet 3  . aspirin 81 MG tablet Take 81 mg by mouth daily.      [name] Kitchen doxazosin (CARDURA) 8 MG tablet Take 1 tablet (8 mg total) by mouth at bedtime. 90 tablet 3  . fexofenadine (ALLEGRA) 180 MG tablet Take 1 tablet (180 mg total) by mouth daily. 90 tablet 3  . glucose blood (ACCU-CHEK AVIVA) test strip Use daily as directed 100 each 6  . metFORMIN (GLUCOPHAGE-XR) 500 MG 24 hr tablet Take 2 tablets (1,000 mg total) by mouth 2 (two) times daily with a meal. 360 tablet 3  . sildenafil (VIAGRA) 50 MG tablet Take 1 tablet (50 mg total) by mouth daily as needed. As directed 10 tablet 6  . simvastatin (ZOCOR) 40 MG tablet Take 1 tablet (40 mg total) by mouth daily. 90 tablet 3  . valsartan-hydrochlorothiazide (DIOVAN-HCT) 320-25 MG per tablet Take 1 tablet by mouth daily. 90 tablet 3   No current facility-administered medications on file prior to visit.    BP 140/72 mmHg  Pulse 52  Temp(Src) 97.9 F (36.6 C) (Oral)  Resp 20  Ht '5\' 7"'  (1.702 m)  Wt 203 lb (92.08 kg)  BMI 31.79 kg/m2  SpO2 96%    Results for orders placed or performed in visit on 08/05/14  Lipid Panel  Result Value Ref Range   Cholesterol 114 0 - 200 mg/dL   Triglycerides 107.0 0.0 - 149.0 mg/dL   HDL 36.30 (L) >39.00 mg/dL   VLDL 21.4 0.0 - 40.0 mg/dL   LDL Cholesterol 56 0 - 99 mg/dL   Total CHOL/HDL Ratio 3    NonHDL 77.70   Hepatic Function Panel  Result Value Ref Range   Total Bilirubin 0.4 0.2 - 1.2 mg/dL   Bilirubin, Direct 0.1 0.0 - 0.3 mg/dL   Alkaline Phosphatase 61 39 - 117 U/L   AST 17 0 - 37 U/L   ALT 20 0 - 53 U/L   Total Protein 6.7 6.0 - 8.3 g/dL   Albumin 3.9 3.5 - 5.2 g/dL  CBC with Differential  Result Value Ref Range   WBC 7.4 4.0 - 10.5 K/uL   RBC 4.66 4.22 - 5.81 Mil/uL   Hemoglobin 13.7 13.0 - 17.0 g/dL   HCT 41.0 39.0 - 52.0 %   MCV 88.1 78.0 - 100.0 fl   MCHC 33.3 30.0 - 36.0 g/dL   RDW 14.6 11.5 -  15.5 %   Platelets 153.0 150.0 - 400.0 K/uL   Neutrophils Relative % 63.1 43.0 - 77.0 %   Lymphocytes Relative 24.0 12.0 - 46.0 %   Monocytes Relative 8.3 3.0 - 12.0 %   Eosinophils Relative 4.1 0.0 - 5.0 %   Basophils Relative 0.5 0.0 - 3.0 %   Neutro  Abs 4.7 1.4 - 7.7 K/uL   Lymphs Abs 1.8 0.7 - 4.0 K/uL   Monocytes Absolute 0.6 0.1 - 1.0 K/uL   Eosinophils Absolute 0.3 0.0 - 0.7 K/uL   Basophils Absolute 0.0 0.0 - 0.1 K/uL  TSH  Result Value Ref Range   TSH 1.15 0.35 - 4.50 uIU/mL  Basic Metabolic Panel  Result Value Ref Range   Sodium 139 135 - 145 mEq/L   Potassium 4.5 3.5 - 5.1 mEq/L   Chloride 106 96 - 112 mEq/L   CO2 24 19 - 32 mEq/L   Glucose, Bld 108 (H) 70 - 99 mg/dL   BUN 39 (H) 6 - 23 mg/dL   Creatinine, Ser 1.67 (H) 0.40 - 1.50 mg/dL   Calcium 9.0 8.4 - 10.5 mg/dL   GFR 43.05 (L) >60.00 mL/min  PSA  Result Value Ref Range   PSA 1.29 0.10 - 4.00 ng/mL  Hemoglobin A1c  Result Value Ref Range   Hgb A1c MFr Bld 6.4 4.6 - 6.5 %  Microalbumin/Creatinine Ratio, Urine  Result Value Ref Range   Microalb, Ur 3.3 (H) 0.0 - 1.9 mg/dL   Creatinine,U 129.1 mg/dL   Microalb Creat Ratio 2.6 0.0 - 30.0 mg/g  POC Urinalysis Dipstick  Result Value Ref Range   Color, UA yellow    Clarity, UA clear    Glucose, UA neg    Bilirubin, UA neg    Ketones, UA neg    Spec Grav, UA 1.015    Blood, UA neg    pH, UA 5.0    Protein, UA neg    Urobilinogen, UA 0.2    Nitrite, UA neg    Leukocytes, UA Negative        Review of Systems  Constitutional: Negative for fever, chills, activity change, appetite change and fatigue.  HENT: Negative for congestion, dental problem, ear pain, hearing loss, mouth sores, rhinorrhea, sinus pressure, sneezing, tinnitus, trouble swallowing and voice change.   Eyes: Negative for photophobia, pain, redness and visual disturbance.  Respiratory: Negative for apnea, cough, choking, chest tightness, shortness of breath and wheezing.    Cardiovascular: Negative for chest pain, palpitations and leg swelling.  Gastrointestinal: Negative for nausea, vomiting, abdominal pain, diarrhea, constipation, blood in stool, abdominal distention, anal bleeding and rectal pain.  Genitourinary: Negative for dysuria, urgency, frequency, hematuria, flank pain, decreased urine volume, discharge, penile swelling, scrotal swelling, difficulty urinating, genital sores and testicular pain.  Musculoskeletal: Negative for myalgias, back pain, joint swelling, arthralgias, gait problem, neck pain and neck stiffness.  Skin: Negative for color change, rash and wound.  Neurological: Negative for dizziness, tremors, seizures, syncope, facial asymmetry, speech difficulty, weakness, light-headedness, numbness and headaches.  Hematological: Negative for adenopathy. Does not bruise/bleed easily.  Psychiatric/Behavioral: Negative for suicidal ideas, hallucinations, behavioral problems, confusion, sleep disturbance, self-injury, dysphoric mood, decreased concentration and agitation. The patient is not nervous/anxious.        Objective:   Physical Exam  Constitutional: He appears well-developed and well-nourished.  HENT:  Head: Normocephalic and atraumatic.  Right Ear: External ear normal.  Left Ear: External ear normal.  Nose: Nose normal.  Mouth/Throat: Oropharynx is clear and moist.  Eyes: Conjunctivae and EOM are normal. Pupils are equal, round, and reactive to light. No scleral icterus.  Neck: Normal range of motion. Neck supple. No JVD present. No thyromegaly present.  Cardiovascular: Regular rhythm, normal heart sounds and intact distal pulses.  Exam reveals no gallop and no friction rub.   No murmur heard.  Pulmonary/Chest: Effort normal and breath sounds normal. He exhibits no tenderness.  Abdominal: Soft. Bowel sounds are normal. He exhibits no distension and no mass. There is no tenderness.  Genitourinary: Penis normal. Guaiac negative stool.   Prostate +2 enlarged smooth and symmetrical  Musculoskeletal: Normal range of motion. He exhibits no edema or tenderness.  Lymphadenopathy:    He has no cervical adenopathy.  Neurological: He is alert. He has normal reflexes. No cranial nerve deficit. Coordination normal.  Skin: Skin is warm and dry. No rash noted.  Psychiatric: He has a normal mood and affect. His behavior is normal.          Assessment & Plan:    Preventive health examination Diabetes well controlled Hypertension- discontinue hydrochlorothiazide and increase Cardura to 8 mg daily Mild BPH Gout/hyperuricemia will continue allopurinol 100 mg daily.   Recheck 3 months

## 2014-08-28 ENCOUNTER — Encounter: Payer: Self-pay | Admitting: Internal Medicine

## 2015-02-07 ENCOUNTER — Encounter: Payer: Self-pay | Admitting: Internal Medicine

## 2015-02-07 ENCOUNTER — Ambulatory Visit (INDEPENDENT_AMBULATORY_CARE_PROVIDER_SITE_OTHER): Payer: Medicare Other | Admitting: Internal Medicine

## 2015-02-07 VITALS — BP 156/80 | HR 54 | Temp 98.3°F | Resp 20 | Ht 67.0 in | Wt 200.0 lb

## 2015-02-07 DIAGNOSIS — E0821 Diabetes mellitus due to underlying condition with diabetic nephropathy: Secondary | ICD-10-CM

## 2015-02-07 DIAGNOSIS — I10 Essential (primary) hypertension: Secondary | ICD-10-CM | POA: Diagnosis not present

## 2015-02-07 DIAGNOSIS — Z23 Encounter for immunization: Secondary | ICD-10-CM

## 2015-02-07 LAB — BASIC METABOLIC PANEL
BUN: 26 mg/dL — ABNORMAL HIGH (ref 6–23)
CO2: 28 mEq/L (ref 19–32)
Calcium: 9.3 mg/dL (ref 8.4–10.5)
Chloride: 105 mEq/L (ref 96–112)
Creatinine, Ser: 1.29 mg/dL (ref 0.40–1.50)
GFR: 57.91 mL/min — ABNORMAL LOW (ref >60.00)
Glucose, Bld: 160 mg/dL — ABNORMAL HIGH (ref 70–99)
Potassium: 4.8 mEq/L (ref 3.5–5.1)
Sodium: 140 mEq/L (ref 135–145)

## 2015-02-07 LAB — HEMOGLOBIN A1C: Hgb A1c MFr Bld: 6.9 % — ABNORMAL HIGH (ref 4.6–6.5)

## 2015-02-07 MED ORDER — ALLOPURINOL 100 MG PO TABS: 100.0000 mg | ORAL_TABLET | Freq: Every day | ORAL | Status: DC

## 2015-02-07 MED ORDER — METFORMIN HCL ER 500 MG PO TB24: 1000.0000 mg | ORAL_TABLET | Freq: Two times a day (BID) | ORAL | Status: DC

## 2015-02-07 MED ORDER — VALSARTAN 320 MG PO TABS: 320.0000 mg | ORAL_TABLET | Freq: Every day | ORAL | Status: DC

## 2015-02-07 MED ORDER — DOXAZOSIN MESYLATE 8 MG PO TABS: 8.0000 mg | ORAL_TABLET | Freq: Every day | ORAL | Status: DC

## 2015-02-07 MED ORDER — SIMVASTATIN 40 MG PO TABS: 40.0000 mg | ORAL_TABLET | Freq: Every day | ORAL | Status: DC

## 2015-02-07 NOTE — Progress Notes (Signed)
Subjective:    Patient ID: Ralph Bell, male    DOB: [birth year], 74 y.o.   MRN: [id number]  HPI  74 year old patient who has type 2 diabetes.  He is doing quite well.  He does get a annual examination.  Each in January.  No concerns or complaints.  Lab Results  Component Value Date   HGBA1C 6.4 08/05/2014    6 months ago.  Metformin therapy decreased due to a decrease in GFR. He remains on statin therapy for dyslipidemia.  No concerns or complaints.  Denies any cardiopulmonary complaints  Preventive health.  Flu vaccine administered today  Past Medical History  Diagnosis Date  . ALLERGIC RHINITIS 11/09/2006  . ASTHMA 11/16/2006  . DIABETES MELLITUS, TYPE II 11/09/2006  . DIVERTICULITIS, HX OF 11/16/2006  . HYPERLIPIDEMIA 11/09/2006  . HYPERTENSION 11/09/2006  . ISCHEMIA 08/20/2008  . Shortness of breath 09/05/2008  . ED (erectile dysfunction)   . Rosacea   . Family hx colonic polyps   . History of kidney stones     Social History   Social History  . Marital Status: Married    Spouse Name: N/A  . Number of Children: N/A  . Years of Education: N/A   Occupational History  . Not on file.   Social History Main Topics  . Smoking status: Former Smoker    Quit date: 04/05/1973  . Smokeless tobacco: Former Systems developer    Quit date: 04/05/1978  . Alcohol Use: Yes  . Drug Use: No  . Sexual Activity: Not on file   Other Topics Concern  . Not on file   Social History Narrative    No past surgical history on file.  No family history on file.  Allergies  Allergen Reactions  . Sulfacetamide Sodium     Current Outpatient Prescriptions on File Prior to Visit  Medication Sig Dispense Refill  . aspirin 81 MG tablet Take 81 mg by mouth daily.      . fexofenadine (ALLEGRA) 180 MG tablet Take 1 tablet (180 mg total) by mouth daily. 90 tablet 3  . glucose blood (ACCU-CHEK AVIVA) test strip Use daily as directed 100 each 6  . sildenafil (VIAGRA) 50 MG tablet Take 1 tablet (50 mg  total) by mouth daily as needed. As directed 10 tablet 6   No current facility-administered medications on file prior to visit.    BP 156/80 mmHg  Pulse 54  Temp(Src) 98.3 F (36.8 C) (Oral)  Resp 20  Ht 5\' 7"  (1.702 m)  Wt 200 lb (90.719 kg)  BMI 31.32 kg/m2  SpO2 98%     Review of Systems  Constitutional: Negative for fever, chills, appetite change and fatigue.  HENT: Negative for congestion, dental problem, ear pain, hearing loss, sore throat, tinnitus, trouble swallowing and voice change.   Eyes: Negative for pain, discharge and visual disturbance.  Respiratory: Negative for cough, chest tightness, wheezing and stridor.   Cardiovascular: Negative for chest pain, palpitations and leg swelling.  Gastrointestinal: Negative for nausea, vomiting, abdominal pain, diarrhea, constipation, blood in stool and abdominal distention.  Genitourinary: Negative for urgency, hematuria, flank pain, discharge, difficulty urinating and genital sores.  Musculoskeletal: Negative for myalgias, back pain, joint swelling, arthralgias, gait problem and neck stiffness.  Skin: Negative for rash.  Neurological: Negative for dizziness, syncope, speech difficulty, weakness, numbness and headaches.  Hematological: Negative for adenopathy. Does not bruise/bleed easily.  Psychiatric/Behavioral: Negative for behavioral problems and dysphoric mood. The patient is not nervous/anxious.  Objective:   Physical Exam  Constitutional: He is oriented to person, place, and time. He appears well-developed.  HENT:  Head: Normocephalic.  Right Ear: External ear normal.  Left Ear: External ear normal.  Eyes: Conjunctivae and EOM are normal.  Neck: Normal range of motion.  Cardiovascular: Normal rate, normal heart sounds and intact distal pulses.   Pulmonary/Chest: Breath sounds normal.  Abdominal: Bowel sounds are normal.  Musculoskeletal: Normal range of motion. He exhibits no edema or tenderness.    Neurological: He is alert and oriented to person, place, and time.  Psychiatric: He has a normal mood and affect. His behavior is normal.          Assessment & Plan:  Diabetes mellitus.  Will check a hemoglobin A1c on submaximal metformin dose Decreased GFR.  Will repeat renal indices Essential hypertension, stable.  Repeat blood pressure 130/80  CPX 6 months

## 2015-02-07 NOTE — Patient Instructions (Signed)
Limit your sodium (Salt) intake    It is important that you exercise regularly, at least 20 minutes 3 to 4 times per week.  If you develop chest pain or shortness of breath seek  medical attention.  Please check your blood pressure on a regular basis.  If it is consistently greater than 150/90, please make an office appointment.  Return in 6 months for follow-up   

## 2015-02-07 NOTE — Progress Notes (Signed)
Pre visit review using our clinic review tool, if applicable. No additional management support is needed unless otherwise documented below in the visit note. 

## 2015-02-12 ENCOUNTER — Encounter: Payer: Self-pay | Admitting: Internal Medicine

## 2015-02-13 NOTE — Telephone Encounter (Signed)
Spoke to pt, told him Hemoglobin A1c was 6.9 and Kidney is better. Pt verbalized understanding and stated he saw results on My Chart, but was concerned about Kidneys. Told him results were better than last time, has improved. Pt verbalized understanding.

## 2015-02-21 ENCOUNTER — Ambulatory Visit: Payer: Medicare Other | Admitting: Internal Medicine

## 2015-05-08 ENCOUNTER — Encounter: Payer: Self-pay | Admitting: Internal Medicine

## 2015-05-08 MED ORDER — ALLOPURINOL 100 MG PO TABS: 100.0000 mg | ORAL_TABLET | Freq: Every day | ORAL | Status: DC

## 2015-05-09 ENCOUNTER — Encounter: Payer: Self-pay | Admitting: Internal Medicine

## 2015-05-09 NOTE — Telephone Encounter (Signed)
Dr.K, pt is having a gout flare and Allopurinol is not working but would like increase in dosage or something else. Please advise

## 2015-05-09 NOTE — Telephone Encounter (Signed)
Spoke to pt told him Dr.K is going to increase dose to 200 mg daily. Take 2 tablets daily. Pt verbalized understanding. Told pt will send Rx to pharmacy. Pt said he does not need any right now. Told pt okay and if this does not help please call office. Pt verbalized understanding.

## 2015-08-07 DIAGNOSIS — Z7984 Long term (current) use of oral hypoglycemic drugs: Secondary | ICD-10-CM | POA: Diagnosis not present

## 2015-08-07 DIAGNOSIS — E119 Type 2 diabetes mellitus without complications: Secondary | ICD-10-CM | POA: Diagnosis not present

## 2015-08-07 LAB — HM DIABETES EYE EXAM

## 2015-08-21 ENCOUNTER — Encounter: Payer: Self-pay | Admitting: Internal Medicine

## 2015-08-21 ENCOUNTER — Other Ambulatory Visit (INDEPENDENT_AMBULATORY_CARE_PROVIDER_SITE_OTHER): Payer: Medicare Other

## 2015-08-21 DIAGNOSIS — Z0000 Encounter for general adult medical examination without abnormal findings: Secondary | ICD-10-CM | POA: Diagnosis not present

## 2015-08-21 DIAGNOSIS — R319 Hematuria, unspecified: Secondary | ICD-10-CM

## 2015-08-21 LAB — URINALYSIS, MICROSCOPIC ONLY

## 2015-08-21 LAB — CBC WITH DIFFERENTIAL/PLATELET
Basophils Absolute: 0.1 10*3/uL (ref 0.0–0.1)
Basophils Relative: 0.7 % (ref 0.0–3.0)
Eosinophils Absolute: 0.2 10*3/uL (ref 0.0–0.7)
Eosinophils Relative: 2.7 % (ref 0.0–5.0)
HCT: 42.8 % (ref 39.0–52.0)
Hemoglobin: 14.3 g/dL (ref 13.0–17.0)
Lymphocytes Relative: 21.3 % (ref 12.0–46.0)
Lymphs Abs: 1.6 10*3/uL (ref 0.7–4.0)
MCHC: 33.5 g/dL (ref 30.0–36.0)
MCV: 87.1 fl (ref 78.0–100.0)
Monocytes Absolute: 0.4 10*3/uL (ref 0.1–1.0)
Monocytes Relative: 5.9 % (ref 3.0–12.0)
Neutro Abs: 5.1 10*3/uL (ref 1.4–7.7)
Neutrophils Relative %: 69.4 % (ref 43.0–77.0)
Platelets: 155 10*3/uL (ref 150.0–400.0)
RBC: 4.91 Mil/uL (ref 4.22–5.81)
RDW: 14.1 % (ref 11.5–15.5)
WBC: 7.3 10*3/uL (ref 4.0–10.5)

## 2015-08-21 LAB — HEPATIC FUNCTION PANEL
ALT: 23 U/L (ref 0–53)
AST: 19 U/L (ref 0–37)
Albumin: 4.1 g/dL (ref 3.5–5.2)
Alkaline Phosphatase: 59 U/L (ref 39–117)
Bilirubin, Direct: 0.1 mg/dL (ref 0.0–0.3)
Total Bilirubin: 0.6 mg/dL (ref 0.2–1.2)
Total Protein: 6.7 g/dL (ref 6.0–8.3)

## 2015-08-21 LAB — POC URINALSYSI DIPSTICK (AUTOMATED)
Bilirubin, UA: NEGATIVE
Glucose, UA: NEGATIVE
Ketones, UA: NEGATIVE
Leukocytes, UA: NEGATIVE
Nitrite, UA: NEGATIVE
Spec Grav, UA: >=1.03
Urobilinogen, UA: 0.2
pH, UA: 5.5

## 2015-08-21 LAB — BASIC METABOLIC PANEL
BUN: 29 mg/dL — ABNORMAL HIGH (ref 6–23)
CO2: 26 mEq/L (ref 19–32)
Calcium: 9.4 mg/dL (ref 8.4–10.5)
Chloride: 106 mEq/L (ref 96–112)
Creatinine, Ser: 1.37 mg/dL (ref 0.40–1.50)
GFR: 53.95 mL/min — ABNORMAL LOW (ref >60.00)
Glucose, Bld: 154 mg/dL — ABNORMAL HIGH (ref 70–99)
Potassium: 4.9 mEq/L (ref 3.5–5.1)
Sodium: 139 mEq/L (ref 135–145)

## 2015-08-21 LAB — LIPID PANEL
Cholesterol: 122 mg/dL (ref 0–200)
HDL: 36.3 mg/dL — ABNORMAL LOW (ref >39.00)
LDL Cholesterol: 56 mg/dL (ref 0–99)
NonHDL: 85.9
Total CHOL/HDL Ratio: 3
Triglycerides: 148 mg/dL (ref 0.0–149.0)
VLDL: 29.6 mg/dL (ref 0.0–40.0)

## 2015-08-21 LAB — MICROALBUMIN / CREATININE URINE RATIO
Creatinine,U: 171.8 mg/dL
Microalb Creat Ratio: 54.9 mg/g — ABNORMAL HIGH (ref 0.0–30.0)
Microalb, Ur: 94.3 mg/dL — ABNORMAL HIGH (ref 0.0–1.9)

## 2015-08-21 LAB — TSH: TSH: 1.48 u[IU]/mL (ref 0.35–4.50)

## 2015-08-21 LAB — HEMOGLOBIN A1C: Hgb A1c MFr Bld: 7.3 % — ABNORMAL HIGH (ref 4.6–6.5)

## 2015-08-22 LAB — HEPATITIS C ANTIBODY: HCV Ab: NEGATIVE

## 2015-08-27 ENCOUNTER — Encounter: Payer: Medicare Other | Admitting: Internal Medicine

## 2015-09-02 ENCOUNTER — Encounter: Payer: Self-pay | Admitting: Internal Medicine

## 2015-09-02 ENCOUNTER — Ambulatory Visit (INDEPENDENT_AMBULATORY_CARE_PROVIDER_SITE_OTHER): Payer: Medicare Other | Admitting: Internal Medicine

## 2015-09-02 VITALS — BP 120/70 | HR 45 | Temp 98.0°F | Resp 20 | Ht 66.75 in | Wt 202.0 lb

## 2015-09-02 DIAGNOSIS — M1000 Idiopathic gout, unspecified site: Secondary | ICD-10-CM | POA: Diagnosis not present

## 2015-09-02 DIAGNOSIS — E785 Hyperlipidemia, unspecified: Secondary | ICD-10-CM

## 2015-09-02 DIAGNOSIS — I10 Essential (primary) hypertension: Secondary | ICD-10-CM | POA: Diagnosis not present

## 2015-09-02 DIAGNOSIS — Z0000 Encounter for general adult medical examination without abnormal findings: Secondary | ICD-10-CM | POA: Diagnosis not present

## 2015-09-02 DIAGNOSIS — E0821 Diabetes mellitus due to underlying condition with diabetic nephropathy: Secondary | ICD-10-CM

## 2015-09-02 NOTE — Patient Instructions (Signed)
Limit your sodium (Salt) intake    It is important that you exercise regularly, at least 20 minutes 3 to 4 times per week.  If you develop chest pain or shortness of breath seek  medical attention.   Please check your hemoglobin A1c every 3 months   

## 2015-09-02 NOTE — Progress Notes (Signed)
Patient ID: Ralph Bell, male   DOB: [birth year], 75 y.o.   MRN: [id number]  Subjective:    Patient ID: Ralph Bell, male    DOB: [birth year], 75 y.o.   MRN: [id number]  HPI  75   year-old patient who is seen today for a preventive health examination  He  has a history of type 2 diabetes which has been controlled on metformin therapy. He has dyslipidemia and hypertension. He denies any cardiopulmonary complaints. He has a history of screening colonoscopy in 2013.   Lab Results  Component Value Date   HGBA1C 7.3* 08/21/2015   Admits to dietary noncompliance over the past 6 months or so  Wt Readings from Last 3 Encounters:  09/02/15 202 lb (91.627 kg)  02/07/15 200 lb (90.719 kg)  08/22/14 203 lb (92.08 kg)    Smoking Status: quit  Year Quit: 30 yrs ago   Allergies:  1) ! Sulf-10   Past History:  Past Medical History:   Diabetes mellitus, type II  Hyperlipidemia  Hypertension  Allergic rhinitis  colonic polyps  kidney stones  diverticulosis  dyslipidemia  High Cholesterol  Asthma  Diverticulitis, hx of  rosace  ED  Past Surgical History:   Denies surgical history  colonoscopy March 2008 2013   Family History:   father died at 30 of lung cancer. Also had prostate cancer  mother died at 95. Hypertension, multiple myeloma  one sister living and well;   colonoscopy 2013   Social History:   Occupation: Manufacturing engineer  Married  Never Smoked  Alcohol use-yes  Drug use-no  Regular exercise-no  Smoking Status: quit     1. Risk factors, based on past M,S,F history- cardiovascular risk factors include diabetes hypertension and dyslipidemia  2. Physical activities: No activity restrictions  3. Depression/mood: No history of depression or mood disorder  4. Hearing: No deficits  5. ADL's: Independent in a [place] of daily living  6. Fall risk: Low  7. Home safety: No problems identified  8. Height weight, and visual acuity; height and weight are  stable no change in visual acuity  9. Counseling: Follow up colonoscopy in 2013  10. Lab orders based on risk factors: Laboratory studies were reviewed  11. Referral: Not appropriate at this time has had a recent eye examination  12. Care plan: The patient will have a repeat PSA performed in 3 months at the time of his next hemoglobin A1c  13. Cognitive assessment: Alert and oriented with normal affect  14.  Preventive services will include annual health assessments with screening lab.  He will continue to have annual eye examinations.  Colonoscopies at 5 year intervals. 15. Provider list update includes ophthalmology primary care medicine and GI.   Past Medical History  Diagnosis Date  . ALLERGIC RHINITIS 11/09/2006  . ASTHMA 11/16/2006  . DIABETES MELLITUS, TYPE II 11/09/2006  . DIVERTICULITIS, HX OF 11/16/2006  . HYPERLIPIDEMIA 11/09/2006  . HYPERTENSION 11/09/2006  . ISCHEMIA 08/20/2008  . Shortness of breath 09/05/2008  . ED (erectile dysfunction)   . Rosacea   . Family hx colonic polyps   . History of kidney stones     Social History   Social History  . Marital Status: Married    Spouse Name: N/A  . Number of Children: N/A  . Years of Education: N/A   Occupational History  . Not on file.   Social History Main Topics  . Smoking status: Former Smoker    Quit date: 04/05/1973  .  Smokeless tobacco: Former Systems developer    Quit date: 04/05/1978  . Alcohol Use: Yes  . Drug Use: No  . Sexual Activity: Not on file   Other Topics Concern  . Not on file   Social History Narrative    No past surgical history on file.  No family history on file.  Allergies  Allergen Reactions  . Sulfacetamide Sodium     Current Outpatient Prescriptions on File Prior to Visit  Medication Sig Dispense Refill  . allopurinol (ZYLOPRIM) 100 MG tablet Take 1 tablet (100 mg total) by mouth daily. 30 tablet 0  . aspirin 81 MG tablet Take 81 mg by mouth daily.      [name] Kitchen doxazosin (CARDURA) 8 MG tablet Take 1  tablet (8 mg total) by mouth at bedtime. 90 tablet 3  . fexofenadine (ALLEGRA) 180 MG tablet Take 1 tablet (180 mg total) by mouth daily. 90 tablet 3  . glucose blood (ACCU-CHEK AVIVA) test strip Use daily as directed 100 each 6  . metFORMIN (GLUCOPHAGE-XR) 500 MG 24 hr tablet Take 2 tablets (1,000 mg total) by mouth 2 (two) times daily with a meal. 360 tablet 3  . simvastatin (ZOCOR) 40 MG tablet Take 1 tablet (40 mg total) by mouth daily. 90 tablet 3  . valsartan (DIOVAN) 320 MG tablet Take 1 tablet (320 mg total) by mouth daily. 90 tablet 3   No current facility-administered medications on file prior to visit.    BP 120/70 mmHg  Pulse 45  Temp(Src) 98 F (36.7 C) (Oral)  Resp 20  Ht 5' 6.75" (1.695 m)  Wt 202 lb (91.627 kg)  BMI 31.89 kg/m2  SpO2 98%    Results for orders placed or performed in visit on 09/62/83  Basic metabolic panel  Result Value Ref Range   Sodium 139 135 - 145 mEq/L   Potassium 4.9 3.5 - 5.1 mEq/L   Chloride 106 96 - 112 mEq/L   CO2 26 19 - 32 mEq/L   Glucose, Bld 154 (H) 70 - 99 mg/dL   BUN 29 (H) 6 - 23 mg/dL   Creatinine, Ser 1.37 0.40 - 1.50 mg/dL   Calcium 9.4 8.4 - 10.5 mg/dL   GFR 53.95 (L) >60.00 mL/min  CBC with Differential/Platelet  Result Value Ref Range   WBC 7.3 4.0 - 10.5 K/uL   RBC 4.91 4.22 - 5.81 Mil/uL   Hemoglobin 14.3 13.0 - 17.0 g/dL   HCT 42.8 39.0 - 52.0 %   MCV 87.1 78.0 - 100.0 fl   MCHC 33.5 30.0 - 36.0 g/dL   RDW 14.1 11.5 - 15.5 %   Platelets 155.0 150.0 - 400.0 K/uL   Neutrophils Relative % 69.4 43.0 - 77.0 %   Lymphocytes Relative 21.3 12.0 - 46.0 %   Monocytes Relative 5.9 3.0 - 12.0 %   Eosinophils Relative 2.7 0.0 - 5.0 %   Basophils Relative 0.7 0.0 - 3.0 %   Neutro Abs 5.1 1.4 - 7.7 K/uL   Lymphs Abs 1.6 0.7 - 4.0 K/uL   Monocytes Absolute 0.4 0.1 - 1.0 K/uL   Eosinophils Absolute 0.2 0.0 - 0.7 K/uL   Basophils Absolute 0.1 0.0 - 0.1 K/uL  Hepatic function panel  Result Value Ref Range   Total Bilirubin  0.6 0.2 - 1.2 mg/dL   Bilirubin, Direct 0.1 0.0 - 0.3 mg/dL   Alkaline Phosphatase 59 39 - 117 U/L   AST 19 0 - 37 U/L   ALT 23 0 - 53  U/L   Total Protein 6.7 6.0 - 8.3 g/dL   Albumin 4.1 3.5 - 5.2 g/dL  Hepatitis C antibody  Result Value Ref Range   HCV Ab NEGATIVE NEGATIVE  Lipid panel  Result Value Ref Range   Cholesterol 122 0 - 200 mg/dL   Triglycerides 148.0 0.0 - 149.0 mg/dL   HDL 36.30 (L) >39.00 mg/dL   VLDL 29.6 0.0 - 40.0 mg/dL   LDL Cholesterol 56 0 - 99 mg/dL   Total CHOL/HDL Ratio 3    NonHDL 85.90   TSH  Result Value Ref Range   TSH 1.48 0.35 - 4.50 uIU/mL  Hemoglobin A1c  Result Value Ref Range   Hgb A1c MFr Bld 7.3 (H) 4.6 - 6.5 %  Microalbumin / creatinine urine ratio  Result Value Ref Range   Microalb, Ur 94.3 (H) 0.0 - 1.9 mg/dL   Creatinine,U 171.8 mg/dL   Microalb Creat Ratio 54.9 (H) 0.0 - 30.0 mg/g  Urine Microscopic  Result Value Ref Range   WBC, UA 0-2/hpf 0-2/hpf   RBC / HPF 0-2/hpf 0-2/hpf  POCT Urinalysis Dipstick (Automated)  Result Value Ref Range   Color, UA yellow    Clarity, UA clear    Glucose, UA n    Bilirubin, UA n    Ketones, UA n    Spec Grav, UA >=1.030    Blood, UA trace lysed    pH, UA 5.5    Protein, UA 3+    Urobilinogen, UA 0.2    Nitrite, UA n    Leukocytes, UA Negative Negative       Review of Systems  Constitutional: Negative for fever, chills, activity change, appetite change and fatigue.  HENT: Negative for congestion, dental problem, ear pain, hearing loss, mouth sores, rhinorrhea, sinus pressure, sneezing, tinnitus, trouble swallowing and voice change.   Eyes: Negative for photophobia, pain, redness and visual disturbance.  Respiratory: Negative for apnea, cough, choking, chest tightness, shortness of breath and wheezing.   Cardiovascular: Negative for chest pain, palpitations and leg swelling.  Gastrointestinal: Negative for nausea, vomiting, abdominal pain, diarrhea, constipation, blood in stool,  abdominal distention, anal bleeding and rectal pain.  Genitourinary: Negative for dysuria, urgency, frequency, hematuria, flank pain, decreased urine volume, discharge, penile swelling, scrotal swelling, difficulty urinating, genital sores and testicular pain.  Musculoskeletal: Negative for myalgias, back pain, joint swelling, arthralgias, gait problem, neck pain and neck stiffness.  Skin: Negative for color change, rash and wound.  Neurological: Negative for dizziness, tremors, seizures, syncope, facial asymmetry, speech difficulty, weakness, light-headedness, numbness and headaches.  Hematological: Negative for adenopathy. Does not bruise/bleed easily.  Psychiatric/Behavioral: Negative for suicidal ideas, hallucinations, behavioral problems, confusion, sleep disturbance, self-injury, dysphoric mood, decreased concentration and agitation. The patient is not nervous/anxious.        Objective:   Physical Exam  Constitutional: He appears well-developed and well-nourished.  HENT:  Head: Normocephalic and atraumatic.  Right Ear: External ear normal.  Left Ear: External ear normal.  Nose: Nose normal.  Mouth/Throat: Oropharynx is clear and moist.  Eyes: Conjunctivae and EOM are normal. Pupils are equal, round, and reactive to light. No scleral icterus.  Neck: Normal range of motion. Neck supple. No JVD present. No thyromegaly present.  Cardiovascular: Regular rhythm, normal heart sounds and intact distal pulses.  Exam reveals no gallop and no friction rub.   No murmur heard. Pulmonary/Chest: Effort normal and breath sounds normal. He exhibits no tenderness.  Abdominal: Soft. Bowel sounds are normal. He exhibits no distension  and no mass. There is no tenderness.  Genitourinary: Penis normal. Guaiac negative stool.  Prostate +2 enlarged smooth and symmetrical  Musculoskeletal: Normal range of motion. He exhibits no edema or tenderness.  Lymphadenopathy:    He has no cervical adenopathy.   Neurological: He is alert. He has normal reflexes. No cranial nerve deficit. Coordination normal.  Skin: Skin is warm and dry. No rash noted.  Psychiatric: He has a normal mood and affect. His behavior is normal.          Assessment & Plan:    Preventive health examination Diabetes.  Suboptimal control.  Will increase metformin to 2 g daily in divided dosages.  Recheck hemoglobin A1c in 3 months Hypertension- well controlled Mild BPH Gout/hyperuricemia will continue allopurinol 100 mg daily.   Recheck 3 months

## 2015-11-30 ENCOUNTER — Other Ambulatory Visit: Payer: Self-pay | Admitting: Internal Medicine

## 2015-12-03 ENCOUNTER — Encounter: Payer: Self-pay | Admitting: Internal Medicine

## 2015-12-03 ENCOUNTER — Ambulatory Visit (INDEPENDENT_AMBULATORY_CARE_PROVIDER_SITE_OTHER): Payer: Medicare Other | Admitting: Internal Medicine

## 2015-12-03 VITALS — BP 160/90 | HR 51 | Temp 98.0°F | Resp 20 | Ht 66.75 in | Wt 190.0 lb

## 2015-12-03 DIAGNOSIS — E0821 Diabetes mellitus due to underlying condition with diabetic nephropathy: Secondary | ICD-10-CM

## 2015-12-03 DIAGNOSIS — E785 Hyperlipidemia, unspecified: Secondary | ICD-10-CM

## 2015-12-03 DIAGNOSIS — I10 Essential (primary) hypertension: Secondary | ICD-10-CM

## 2015-12-03 LAB — HEMOGLOBIN A1C: Hgb A1c MFr Bld: 5.9 % (ref 4.6–6.5)

## 2015-12-03 NOTE — Progress Notes (Signed)
Pre visit review using our clinic review tool, if applicable. No additional management support is needed unless otherwise documented below in the visit note. 

## 2015-12-03 NOTE — Patient Instructions (Signed)
Limit your sodium (Salt) intake  Please check your blood pressure on a regular basis.  If it is consistently greater than 150/90, please make an office appointment.   Please check your hemoglobin A1c every 3-6  months   

## 2015-12-03 NOTE — Progress Notes (Signed)
Subjective:    Patient ID: Ralph Bell, male    DOB: Dec 25, 1940, 75 y.o.   MRN: [id number]  HPI 75 year old patient who is seen today for follow-up of type 2 diabetes.  He was seen for a annual preventive health examination.  3 months ago and hemoglobin A1c was slightly elevated at 7.3. Metformin therapy was maximized and she has adhered much better to dietary measures.  His weight is down 12 pounds.  He feels well  Past Medical History:  Diagnosis Date  . ALLERGIC RHINITIS 11/09/2006  . ASTHMA 11/16/2006  . DIABETES MELLITUS, TYPE II 11/09/2006  . DIVERTICULITIS, HX OF 11/16/2006  . ED (erectile dysfunction)   . Family hx colonic polyps   . History of kidney stones   . HYPERLIPIDEMIA 11/09/2006  . HYPERTENSION 11/09/2006  . ISCHEMIA 08/20/2008  . Rosacea   . Shortness of breath 09/05/2008     Social History   Social History  . Marital status: Married    Spouse name: N/A  . Number of children: N/A  . Years of education: N/A   Occupational History  . Not on file.   Social History Main Topics  . Smoking status: Former Smoker    Quit date: 04/05/1973  . Smokeless tobacco: Former Systems developer    Quit date: 04/05/1978  . Alcohol use Yes  . Drug use: No  . Sexual activity: Not on file   Other Topics Concern  . Not on file   Social History Narrative  . No narrative on file    No past surgical history on file.  No family history on file.  Allergies  Allergen Reactions  . Sulfacetamide Sodium     Current Outpatient Prescriptions on File Prior to Visit  Medication Sig Dispense Refill  . allopurinol (ZYLOPRIM) 100 MG tablet Take 1 tablet (100 mg total) by mouth daily. 30 tablet 0  . allopurinol (ZYLOPRIM) 100 MG tablet Take 1 tablet by mouth  daily 90 tablet 4  . aspirin 81 MG tablet Take 81 mg by mouth daily.      [name] Kitchen doxazosin (CARDURA) 8 MG tablet Take 1 tablet (8 mg total) by mouth at bedtime. 90 tablet 3  . fexofenadine (ALLEGRA) 180 MG tablet Take 1 tablet (180 mg total) by  mouth daily. 90 tablet 3  . glucose blood (ACCU-CHEK AVIVA) test strip Use daily as directed 100 each 6  . metFORMIN (GLUCOPHAGE-XR) 500 MG 24 hr tablet Take 2 tablets (1,000 mg total) by mouth 2 (two) times daily with a meal. 360 tablet 3  . simvastatin (ZOCOR) 40 MG tablet Take 1 tablet (40 mg total) by mouth daily. 90 tablet 3  . valsartan (DIOVAN) 320 MG tablet Take 1 tablet (320 mg total) by mouth daily. 90 tablet 3   No current facility-administered medications on file prior to visit.     BP (!) 160/90 (BP Location: Left Arm, Patient Position: Sitting, Cuff Size: Normal)   Pulse (!) 51   Temp 98 F (36.7 C) (Oral)   Resp 20   Ht 5' 6.75" (1.695 m)   Wt 190 lb (86.2 kg)   SpO2 98%   BMI 29.98 kg/m      Review of Systems  Constitutional: Negative.   Eyes: Negative for visual disturbance.  Respiratory: Negative for chest tightness and shortness of breath.   Cardiovascular: Negative for chest pain.  Gastrointestinal: Negative for abdominal pain.  Endocrine: Negative for polyuria.       Objective:   Physical  Exam  Constitutional: He is oriented to person, place, and time. He appears well-developed.  HENT:  Head: Normocephalic.  Right Ear: External ear normal.  Left Ear: External ear normal.  Eyes: Conjunctivae and EOM are normal.  Neck: Normal range of motion.  Cardiovascular: Normal rate and normal heart sounds.   Pulmonary/Chest: Breath sounds normal.  Abdominal: Bowel sounds are normal.  Musculoskeletal: Normal range of motion. He exhibits no edema or tenderness.  Neurological: He is alert and oriented to person, place, and time.  Psychiatric: He has a normal mood and affect. His behavior is normal.          Assessment & Plan:   Diabetes mellitus.  Will check hemoglobin A1c.  If this is less than 7.  Will reassess in 6 months Hypertension.  Repeat blood pressure 140/78 Dyslipidemia.  Continue statin therapy  Follow-up 3-6 months  Aariona Momon  Pilar Plate

## 2015-12-04 ENCOUNTER — Telehealth: Payer: Self-pay | Admitting: Internal Medicine

## 2015-12-04 ENCOUNTER — Encounter: Payer: Self-pay | Admitting: Internal Medicine

## 2015-12-04 NOTE — Telephone Encounter (Signed)
° °  Pt would like a call back about his A1C results. Pt said he is having issues with mychart so he is unable to see the results

## 2015-12-04 NOTE — Telephone Encounter (Signed)
Left message on voicemail that I will send My Chart message regarding A1c results. Any questions please call office.

## 2016-01-06 ENCOUNTER — Ambulatory Visit (INDEPENDENT_AMBULATORY_CARE_PROVIDER_SITE_OTHER): Payer: Medicare Other

## 2016-01-06 DIAGNOSIS — Z23 Encounter for immunization: Secondary | ICD-10-CM

## 2016-01-25 ENCOUNTER — Other Ambulatory Visit: Payer: Self-pay | Admitting: Internal Medicine

## 2016-03-05 ENCOUNTER — Encounter: Payer: Self-pay | Admitting: Internal Medicine

## 2016-03-05 ENCOUNTER — Other Ambulatory Visit: Payer: Self-pay | Admitting: *Deleted

## 2016-03-05 ENCOUNTER — Ambulatory Visit (INDEPENDENT_AMBULATORY_CARE_PROVIDER_SITE_OTHER): Payer: Medicare Other | Admitting: Internal Medicine

## 2016-03-05 VITALS — BP 142/80 | HR 60 | Temp 98.2°F | Resp 20 | Ht 66.75 in | Wt 184.5 lb

## 2016-03-05 DIAGNOSIS — I10 Essential (primary) hypertension: Secondary | ICD-10-CM

## 2016-03-05 DIAGNOSIS — M1000 Idiopathic gout, unspecified site: Secondary | ICD-10-CM | POA: Diagnosis not present

## 2016-03-05 DIAGNOSIS — E785 Hyperlipidemia, unspecified: Secondary | ICD-10-CM | POA: Diagnosis not present

## 2016-03-05 DIAGNOSIS — E0821 Diabetes mellitus due to underlying condition with diabetic nephropathy: Secondary | ICD-10-CM

## 2016-03-05 MED ORDER — SIMVASTATIN 40 MG PO TABS: 40.0000 mg | ORAL_TABLET | Freq: Every day | ORAL | 3 refills | Status: DC

## 2016-03-05 MED ORDER — DOXAZOSIN MESYLATE 8 MG PO TABS: 8.0000 mg | ORAL_TABLET | Freq: Every day | ORAL | 3 refills | Status: DC

## 2016-03-05 MED ORDER — FEXOFENADINE HCL 180 MG PO TABS: 180.0000 mg | ORAL_TABLET | Freq: Every day | ORAL | 3 refills | Status: AC

## 2016-03-05 MED ORDER — VALSARTAN 320 MG PO TABS: 320.0000 mg | ORAL_TABLET | Freq: Every day | ORAL | 3 refills | Status: DC

## 2016-03-05 MED ORDER — METFORMIN HCL ER 500 MG PO TB24: 1000.0000 mg | ORAL_TABLET | Freq: Two times a day (BID) | ORAL | 3 refills | Status: DC

## 2016-03-05 MED ORDER — ALLOPURINOL 100 MG PO TABS
100.0000 mg | ORAL_TABLET | Freq: Every day | ORAL | 3 refills | Status: DC
Start: 2016-03-05 — End: 2017-02-26

## 2016-03-05 NOTE — Progress Notes (Signed)
Subjective:    Patient ID: Ralph Bell, male    DOB: Feb 17, 1941, 75 y.o.   MRN: [id number]  HPI  Lab Results  Component Value Date   HGBA1C 5.9 12/03/2015   Wt Readings from Last 3 Encounters:  03/05/16 184 lb 8 oz (83.7 kg)  12/03/15 190 lb (86.2 kg)  09/02/15 202 lb (88.42 kg)   75 year old patient is seen today for follow-up of diabetes.  His metformin dose has been maximized and he continues to lose weight.  He is doing quite well.  No concerns or complaints Is scheduled for a physical in June  Past Medical History:  Diagnosis Date  . ALLERGIC RHINITIS 11/09/2006  . ASTHMA 11/16/2006  . DIABETES MELLITUS, TYPE II 11/09/2006  . DIVERTICULITIS, HX OF 11/16/2006  . ED (erectile dysfunction)   . Family hx colonic polyps   . History of kidney stones   . HYPERLIPIDEMIA 11/09/2006  . HYPERTENSION 11/09/2006  . ISCHEMIA 08/20/2008  . Rosacea   . Shortness of breath 09/05/2008     Social History   Social History  . Marital status: Married    Spouse name: N/A  . Number of children: N/A  . Years of education: N/A   Occupational History  . Not on file.   Social History Main Topics  . Smoking status: Former Smoker    Quit date: 04/05/1973  . Smokeless tobacco: Former Systems developer    Quit date: 04/05/1978  . Alcohol use Yes  . Drug use: No  . Sexual activity: Not on file   Other Topics Concern  . Not on file   Social History Narrative  . No narrative on file    No past surgical history on file.  No family history on file.  Allergies  Allergen Reactions  . Sulfacetamide Sodium     Current Outpatient Prescriptions on File Prior to Visit  Medication Sig Dispense Refill  . allopurinol (ZYLOPRIM) 100 MG tablet Take 1 tablet (100 mg total) by mouth daily. 30 tablet 0  . allopurinol (ZYLOPRIM) 100 MG tablet Take 1 tablet by mouth  daily 90 tablet 4  . aspirin 81 MG tablet Take 81 mg by mouth daily.      [name] Kitchen doxazosin (CARDURA) 8 MG tablet Take 1 tablet (8 mg total) by mouth at  bedtime. 90 tablet 3  . fexofenadine (ALLEGRA) 180 MG tablet Take 1 tablet (180 mg total) by mouth daily. 90 tablet 3  . glucose blood (ACCU-CHEK AVIVA) test strip Use daily as directed 100 each 6  . metFORMIN (GLUCOPHAGE-XR) 500 MG 24 hr tablet Take 2 tablets (1,000 mg total) by mouth 2 (two) times daily with a meal. 360 tablet 3  . simvastatin (ZOCOR) 40 MG tablet Take 1 tablet (40 mg total) by mouth daily. 90 tablet 3  . valsartan (DIOVAN) 320 MG tablet TAKE 1 TABLET BY MOUTH  DAILY 90 tablet 3   No current facility-administered medications on file prior to visit.     BP (!) 142/80 (BP Location: Left Arm, Patient Position: Sitting, Cuff Size: Normal)   Pulse 60   Temp 98.2 F (36.8 C) (Oral)   Resp 20   Ht 5' 6.75" (1.695 m)   Wt 184 lb 8 oz (83.7 kg)   SpO2 98%   BMI 29.11 kg/m     Review of Systems  Constitutional: Negative for appetite change, chills, fatigue and fever.  HENT: Negative for congestion, dental problem, ear pain, hearing loss, sore throat, tinnitus, trouble swallowing  and voice change.   Eyes: Negative for pain, discharge and visual disturbance.  Respiratory: Negative for cough, chest tightness, wheezing and stridor.   Cardiovascular: Negative for chest pain, palpitations and leg swelling.  Gastrointestinal: Negative for abdominal distention, abdominal pain, blood in stool, constipation, diarrhea, nausea and vomiting.  Genitourinary: Negative for difficulty urinating, discharge, flank pain, genital sores, hematuria and urgency.  Musculoskeletal: Negative for arthralgias, back pain, gait problem, joint swelling, myalgias and neck stiffness.  Skin: Negative for rash.  Neurological: Negative for dizziness, syncope, speech difficulty, weakness, numbness and headaches.  Hematological: Negative for adenopathy. Does not bruise/bleed easily.  Psychiatric/Behavioral: Negative for behavioral problems and dysphoric mood. The patient is not nervous/anxious.          Objective:   Physical Exam  Constitutional: He is oriented to person, place, and time. He appears well-developed.  HENT:  Head: Normocephalic.  Right Ear: External ear normal.  Left Ear: External ear normal.  Eyes: Conjunctivae and EOM are normal.  Neck: Normal range of motion.  Cardiovascular: Normal rate and normal heart sounds.   Pulmonary/Chest: Breath sounds normal.  Abdominal: Bowel sounds are normal.  Musculoskeletal: Normal range of motion. He exhibits no edema or tenderness.  Neurological: He is alert and oriented to person, place, and time.  Psychiatric: He has a normal mood and affect. His behavior is normal.          Assessment & Plan:   Diabetes mellitus.  Excellent control with additional weight loss Essential hypertension, stable Dyslipidemia.  Continue statin therapy  CPX as scheduled in June No change in medical regimen  Nyoka Cowden

## 2016-03-05 NOTE — Patient Instructions (Signed)
Limit your sodium (Salt) intake   Please check your hemoglobin A1c every 3 months     It is important that you exercise regularly, at least 20 minutes 3 to 4 times per week.  If you develop chest pain or shortness of breath seek  medical attention.  Return in 6 months for follow-up

## 2016-04-26 ENCOUNTER — Encounter: Payer: Self-pay | Admitting: Internal Medicine

## 2016-04-26 ENCOUNTER — Ambulatory Visit (INDEPENDENT_AMBULATORY_CARE_PROVIDER_SITE_OTHER): Payer: Medicare Other | Admitting: Internal Medicine

## 2016-04-26 VITALS — BP 122/68 | HR 59 | Temp 98.1°F | Ht 66.0 in | Wt 189.8 lb

## 2016-04-26 DIAGNOSIS — I10 Essential (primary) hypertension: Secondary | ICD-10-CM | POA: Diagnosis not present

## 2016-04-26 DIAGNOSIS — R300 Dysuria: Secondary | ICD-10-CM | POA: Diagnosis not present

## 2016-04-26 DIAGNOSIS — E0821 Diabetes mellitus due to underlying condition with diabetic nephropathy: Secondary | ICD-10-CM

## 2016-04-26 LAB — POC URINALSYSI DIPSTICK (AUTOMATED)
Bilirubin, UA: NEGATIVE
Blood, UA: NEGATIVE
Glucose, UA: NEGATIVE
Ketones, UA: NEGATIVE
Leukocytes, UA: NEGATIVE
Nitrite, UA: NEGATIVE
Spec Grav, UA: 1.025
Urobilinogen, UA: 0.2
pH, UA: 5.5

## 2016-04-26 MED ORDER — CIPROFLOXACIN HCL 500 MG PO TABS: 500.0000 mg | ORAL_TABLET | Freq: Two times a day (BID) | ORAL | 0 refills | Status: DC

## 2016-04-26 NOTE — Patient Instructions (Signed)
Call or return to clinic prn if these symptoms worsen or fail to improve as anticipated.

## 2016-04-26 NOTE — Progress Notes (Signed)
Subjective:    Patient ID: Ralph Bell, male    DOB: [birth year], 76 y.o.   MRN: [id number]  HPI  76 year old patient who presents with a 2-3 day history of suprapubic discomfort.  He does have a history of kidney stones and did have some renal colic in 0350 and 0938.  For the past day or 2 his abdominal discomfort has largely resolved.  No urinary frequency, urgency or dysuria. The patient will be leaving the country tomorrow for a [place] cruise and was concerned about recurrent pain. He has a history of essential hypertension and diabetes  Past Medical History:  Diagnosis Date  . ALLERGIC RHINITIS 11/09/2006  . ASTHMA 11/16/2006  . DIABETES MELLITUS, TYPE II 11/09/2006  . DIVERTICULITIS, HX OF 11/16/2006  . ED (erectile dysfunction)   . Family hx colonic polyps   . History of kidney stones   . HYPERLIPIDEMIA 11/09/2006  . HYPERTENSION 11/09/2006  . ISCHEMIA 08/20/2008  . Rosacea   . Shortness of breath 09/05/2008     Social History   Social History  . Marital status: Married    Spouse name: N/A  . Number of children: N/A  . Years of education: N/A   Occupational History  . Not on file.   Social History Main Topics  . Smoking status: Former Smoker    Quit date: 04/05/1973  . Smokeless tobacco: Former Systems developer    Quit date: 04/05/1978  . Alcohol use Yes  . Drug use: No  . Sexual activity: Not on file   Other Topics Concern  . Not on file   Social History Narrative  . No narrative on file    No past surgical history on file.  No family history on file.  Allergies  Allergen Reactions  . Sulfacetamide Sodium     Current Outpatient Prescriptions on File Prior to Visit  Medication Sig Dispense Refill  . allopurinol (ZYLOPRIM) 100 MG tablet Take 1 tablet (100 mg total) by mouth daily. 90 tablet 3  . aspirin 81 MG tablet Take 81 mg by mouth daily.      [name] Kitchen doxazosin (CARDURA) 8 MG tablet Take 1 tablet (8 mg total) by mouth at bedtime. 90 tablet 3  . fexofenadine (ALLEGRA)  180 MG tablet Take 1 tablet (180 mg total) by mouth daily. 90 tablet 3  . glucose blood (ACCU-CHEK AVIVA) test strip Use daily as directed 100 each 6  . metFORMIN (GLUCOPHAGE-XR) 500 MG 24 hr tablet Take 2 tablets (1,000 mg total) by mouth 2 (two) times daily with a meal. 360 tablet 3  . simvastatin (ZOCOR) 40 MG tablet Take 1 tablet (40 mg total) by mouth daily. 90 tablet 3  . valsartan (DIOVAN) 320 MG tablet Take 1 tablet (320 mg total) by mouth daily. 90 tablet 3   No current facility-administered medications on file prior to visit.     BP 122/68 (BP Location: Left Arm, Patient Position: Sitting, Cuff Size: Normal)   Pulse (!) 59   Temp 98.1 F (36.7 C) (Oral)   Ht 5\' 6"  (1.676 m)   Wt 189 lb 12.8 oz (86.1 kg)   SpO2 99%   BMI 30.63 kg/m     Review of Systems  Constitutional: Negative for appetite change, chills, fatigue and fever.  HENT: Negative for congestion, dental problem, ear pain, hearing loss, sore throat, tinnitus, trouble swallowing and voice change.   Eyes: Negative for pain, discharge and visual disturbance.  Respiratory: Negative for cough, chest tightness, wheezing and  stridor.   Cardiovascular: Negative for chest pain, palpitations and leg swelling.  Gastrointestinal: Positive for abdominal pain. Negative for abdominal distention, blood in stool, constipation, diarrhea, nausea and vomiting.  Genitourinary: Negative for difficulty urinating, discharge, flank pain, genital sores, hematuria and urgency.  Musculoskeletal: Negative for arthralgias, back pain, gait problem, joint swelling, myalgias and neck stiffness.  Skin: Negative for rash.  Neurological: Negative for dizziness, syncope, speech difficulty, weakness, numbness and headaches.  Hematological: Negative for adenopathy. Does not bruise/bleed easily.  Psychiatric/Behavioral: Negative for behavioral problems and dysphoric mood. The patient is not nervous/anxious.        Objective:   Physical Exam    Constitutional: He is oriented to person, place, and time. He appears well-developed.  HENT:  Head: Normocephalic.  Right Ear: External ear normal.  Left Ear: External ear normal.  Eyes: Conjunctivae and EOM are normal.  Neck: Normal range of motion.  Cardiovascular: Normal rate and normal heart sounds.   Pulmonary/Chest: Breath sounds normal.  Abdominal: Soft. Bowel sounds are normal. He exhibits no distension. There is no tenderness. There is no rebound and no guarding.  Suggestion of mild tenderness to deep palpation in the suprapubic area, but exam basically benign  Musculoskeletal: Normal range of motion. He exhibits no edema or tenderness.  Neurological: He is alert and oriented to person, place, and time.  Psychiatric: He has a normal mood and affect. His behavior is normal.          Assessment & Plan:   Nonspecific abdominal pain.  Resolved.  Since the patient be leaving the country, the patient will have a prescription for Cipro filled.  He will take this medication only if pain reoccurs. .  This does not sound like renal colic.  Urinalysis reviewed and was negative.  I suppose this could've represented a mild diverticular episode.  Will observe at this time Essential hypertension, stable  Nyoka Cowden

## 2016-04-26 NOTE — Progress Notes (Signed)
Pre visit review using our clinic review tool, if applicable. No additional management support is needed unless otherwise documented below in the visit note. 

## 2016-08-10 DIAGNOSIS — E119 Type 2 diabetes mellitus without complications: Secondary | ICD-10-CM | POA: Diagnosis not present

## 2016-08-10 DIAGNOSIS — H01002 Unspecified blepharitis right lower eyelid: Secondary | ICD-10-CM | POA: Diagnosis not present

## 2016-08-10 LAB — HM DIABETES EYE EXAM

## 2016-08-23 ENCOUNTER — Encounter: Payer: Self-pay | Admitting: Family Medicine

## 2016-08-31 ENCOUNTER — Other Ambulatory Visit (INDEPENDENT_AMBULATORY_CARE_PROVIDER_SITE_OTHER): Payer: Medicare Other

## 2016-08-31 DIAGNOSIS — I10 Essential (primary) hypertension: Secondary | ICD-10-CM | POA: Diagnosis not present

## 2016-08-31 DIAGNOSIS — Z0000 Encounter for general adult medical examination without abnormal findings: Secondary | ICD-10-CM

## 2016-08-31 DIAGNOSIS — E1022 Type 1 diabetes mellitus with diabetic chronic kidney disease: Secondary | ICD-10-CM

## 2016-08-31 DIAGNOSIS — E785 Hyperlipidemia, unspecified: Secondary | ICD-10-CM

## 2016-08-31 LAB — POC URINALSYSI DIPSTICK (AUTOMATED)
Bilirubin, UA: NEGATIVE
Blood, UA: NEGATIVE
Glucose, UA: NEGATIVE
Ketones, UA: NEGATIVE
Leukocytes, UA: NEGATIVE
Nitrite, UA: NEGATIVE
Spec Grav, UA: 1.02 (ref 1.010–1.025)
Urobilinogen, UA: 0.2 E.U./dL
pH, UA: 6 (ref 5.0–8.0)

## 2016-08-31 LAB — CBC WITH DIFFERENTIAL/PLATELET
Basophils Absolute: 0 10*3/uL (ref 0.0–0.1)
Basophils Relative: 0.5 % (ref 0.0–3.0)
Eosinophils Absolute: 0.3 10*3/uL (ref 0.0–0.7)
Eosinophils Relative: 3.6 % (ref 0.0–5.0)
HCT: 43.6 % (ref 39.0–52.0)
Hemoglobin: 14.6 g/dL (ref 13.0–17.0)
Lymphocytes Relative: 19.6 % (ref 12.0–46.0)
Lymphs Abs: 1.5 10*3/uL (ref 0.7–4.0)
MCHC: 33.5 g/dL (ref 30.0–36.0)
MCV: 89.2 fl (ref 78.0–100.0)
Monocytes Absolute: 0.6 10*3/uL (ref 0.1–1.0)
Monocytes Relative: 7.3 % (ref 3.0–12.0)
Neutro Abs: 5.2 10*3/uL (ref 1.4–7.7)
Neutrophils Relative %: 69 % (ref 43.0–77.0)
Platelets: 157 10*3/uL (ref 150.0–400.0)
RBC: 4.88 Mil/uL (ref 4.22–5.81)
RDW: 14 % (ref 11.5–15.5)
WBC: 7.6 10*3/uL (ref 4.0–10.5)

## 2016-08-31 LAB — HEPATIC FUNCTION PANEL
ALT: 19 U/L (ref 0–53)
AST: 19 U/L (ref 0–37)
Albumin: 4.2 g/dL (ref 3.5–5.2)
Alkaline Phosphatase: 45 U/L (ref 39–117)
Bilirubin, Direct: 0.2 mg/dL (ref 0.0–0.3)
Total Bilirubin: 0.6 mg/dL (ref 0.2–1.2)
Total Protein: 6.8 g/dL (ref 6.0–8.3)

## 2016-08-31 LAB — BASIC METABOLIC PANEL
BUN: 31 mg/dL — ABNORMAL HIGH (ref 6–23)
CO2: 25 mEq/L (ref 19–32)
Calcium: 9.4 mg/dL (ref 8.4–10.5)
Chloride: 105 mEq/L (ref 96–112)
Creatinine, Ser: 1.48 mg/dL (ref 0.40–1.50)
GFR: 49.21 mL/min — ABNORMAL LOW (ref >60.00)
Glucose, Bld: 112 mg/dL — ABNORMAL HIGH (ref 70–99)
Potassium: 4.9 mEq/L (ref 3.5–5.1)
Sodium: 140 mEq/L (ref 135–145)

## 2016-08-31 LAB — LIPID PANEL
Cholesterol: 128 mg/dL (ref 0–200)
HDL: 46.1 mg/dL (ref >39.00)
LDL Cholesterol: 50 mg/dL (ref 0–99)
NonHDL: 81.92
Total CHOL/HDL Ratio: 3
Triglycerides: 161 mg/dL — ABNORMAL HIGH (ref 0.0–149.0)
VLDL: 32.2 mg/dL (ref 0.0–40.0)

## 2016-08-31 LAB — HEMOGLOBIN A1C: Hgb A1c MFr Bld: 6.1 % (ref 4.6–6.5)

## 2016-09-03 ENCOUNTER — Encounter: Payer: Self-pay | Admitting: Internal Medicine

## 2016-09-03 ENCOUNTER — Ambulatory Visit (INDEPENDENT_AMBULATORY_CARE_PROVIDER_SITE_OTHER): Payer: Medicare Other | Admitting: Internal Medicine

## 2016-09-03 VITALS — BP 138/82 | HR 52 | Temp 97.6°F | Ht 66.0 in | Wt 182.0 lb

## 2016-09-03 DIAGNOSIS — E785 Hyperlipidemia, unspecified: Secondary | ICD-10-CM | POA: Diagnosis not present

## 2016-09-03 DIAGNOSIS — Z0000 Encounter for general adult medical examination without abnormal findings: Secondary | ICD-10-CM | POA: Diagnosis not present

## 2016-09-03 DIAGNOSIS — I10 Essential (primary) hypertension: Secondary | ICD-10-CM | POA: Diagnosis not present

## 2016-09-03 DIAGNOSIS — E0821 Diabetes mellitus due to underlying condition with diabetic nephropathy: Secondary | ICD-10-CM | POA: Diagnosis not present

## 2016-09-03 MED ORDER — METFORMIN HCL ER 500 MG PO TB24: 1000.0000 mg | ORAL_TABLET | Freq: Every day | ORAL | 3 refills | Status: DC

## 2016-09-03 NOTE — Patient Instructions (Addendum)
WE NOW OFFER    [facility] FAST TRACK!!!  SAME DAY Appointments for ACUTE CARE  Such as: Sprains, Injuries, cuts, abrasions, rashes, muscle pain, joint pain, back pain Colds, flu, sore throats, headache, allergies, cough, fever  Ear pain, sinus and eye infections Abdominal pain, nausea, vomiting, diarrhea, upset stomach Animal/insect bites  3 Easy Ways to Schedule: Walk-In Scheduling Call in scheduling Mychart Sign-up: [email or web address]   Decrease metformin to 1000 mg every morning only  Limit your sodium (Salt) intake   Please check your hemoglobin A1c every 3 months    It is important that you exercise regularly, at least 20 minutes 3 to 4 times per week.  If you develop chest pain or shortness of breath seek  medical attention.

## 2016-09-03 NOTE — Progress Notes (Signed)
Subjective:    Patient ID: Ralph Bell, male    DOB: Feb 25, 1941, 76 y.o.   MRN: [id number]  HPI 76 year old patient who is seen today for an annual clinical exam as well as Medicare wellness visit He continues to do quite well.  He has type 2 diabetes which has been well-controlled on metformin therapy only.  He has hypertension and a history of diastolic heart failure.  He has coronary artery disease which has been stable.  Remote history of gout. No cardiopulmonary complaints Has had a recent eye examination  Past Medical History:  Diagnosis Date  . ALLERGIC RHINITIS 11/09/2006  . ASTHMA 11/16/2006  . DIABETES MELLITUS, TYPE II 11/09/2006  . DIVERTICULITIS, HX OF 11/16/2006  . ED (erectile dysfunction)   . Family hx colonic polyps   . History of kidney stones   . HYPERLIPIDEMIA 11/09/2006  . HYPERTENSION 11/09/2006  . ISCHEMIA 08/20/2008  . Rosacea   . Shortness of breath 09/05/2008     Social History   Social History  . Marital status: Married    Spouse name: N/A  . Number of children: N/A  . Years of education: N/A   Occupational History  . Not on file.   Social History Main Topics  . Smoking status: Former Smoker    Quit date: 04/05/1973  . Smokeless tobacco: Former Systems developer    Quit date: 04/05/1978  . Alcohol use Yes  . Drug use: No  . Sexual activity: Not on file   Other Topics Concern  . Not on file   Social History Narrative  . No narrative on file    No past surgical history on file.  No family history on file.  Allergies  Allergen Reactions  . Sulfacetamide Sodium     Current Outpatient Prescriptions on File Prior to Visit  Medication Sig Dispense Refill  . allopurinol (ZYLOPRIM) 100 MG tablet Take 1 tablet (100 mg total) by mouth daily. 90 tablet 3  . aspirin 81 MG tablet Take 81 mg by mouth daily.      [name] Kitchen doxazosin (CARDURA) 8 MG tablet Take 1 tablet (8 mg total) by mouth at bedtime. 90 tablet 3  . fexofenadine (ALLEGRA) 180 MG tablet Take 1 tablet (180  mg total) by mouth daily. 90 tablet 3  . glucose blood (ACCU-CHEK AVIVA) test strip Use daily as directed 100 each 6  . metFORMIN (GLUCOPHAGE-XR) 500 MG 24 hr tablet Take 2 tablets (1,000 mg total) by mouth 2 (two) times daily with a meal. 360 tablet 3  . simvastatin (ZOCOR) 40 MG tablet Take 1 tablet (40 mg total) by mouth daily. 90 tablet 3  . valsartan (DIOVAN) 320 MG tablet Take 1 tablet (320 mg total) by mouth daily. 90 tablet 3   No current facility-administered medications on file prior to visit.     BP 138/82 (BP Location: Left Arm, Patient Position: Sitting, Cuff Size: Normal)   Pulse (!) 52   Temp 97.6 F (36.4 C) (Oral)   Ht '5\' 6"'  (1.676 m)   Wt 182 lb (82.6 kg)   SpO2 98%   BMI 29.38 kg/m   Smoking Status: quit  Year Quit: 30 yrs ago   Allergies:  1) ! Sulf-10   Past History:  Past Medical History:   Diabetes mellitus, type II  Hyperlipidemia  Hypertension  Allergic rhinitis  colonic polyps  kidney stones  diverticulosis  dyslipidemia   Asthma  Diverticulitis, hx of  rosacea  ED  Past Surgical History:  Denies surgical history  colonoscopy March 2008 2013   Family History:   father died at 105 of lung cancer. Also had prostate cancer  mother died at 27. Hypertension, multiple myeloma  one sister living and well;   colonoscopy 2013   Social History:   Occupation: Manufacturing engineer  Married  Never Smoked  Alcohol use-yes  Drug use-no  Regular exercise-no  Smoking Status: quit  very active with travel  Medicare wellness visit  1. Risk factors, based on past M,S,F history- cardiovascular risk factors include diabetes hypertension and dyslipidemia  2. Physical activities: No activity restrictions; Goes to the local [employer] 3-4 times weekly 3. Depression/mood: No history of depression or mood disorder  4. Hearing: No deficits  5. ADL's: Independent in a [place] of daily living  6. Fall risk: Low  7. Home safety: No problems  identified  8. Height weight, and visual acuity; height and weight are stable no change in visual acuity.  Is followed by ophthalmology annually.  Does have mild cataracts but mild  9. Counseling: continue annual eye examinations and active lifestyle. 10. Lab orders based on risk factors: Laboratory studies were reviewed  11. Referral: Not appropriate at this time has had a recent eye examination  12. Care plan: continue efforts at aggressive risk factor modification 13. Cognitive assessment: Alert and oriented with normal affect  14.  Preventive services will include annual health assessments with screening lab.  He will continue to have annual eye examinations. No further colonoscopies.  We'll continue to check stool for occult blood.  Annually and check CBCs annually 15. Provider list update includes ophthalmology primary care medicine and ophthalmology   Review of Systems  Constitutional: Negative for appetite change, chills, fatigue and fever.  HENT: Negative for congestion, dental problem, ear pain, hearing loss, sore throat, tinnitus, trouble swallowing and voice change.   Eyes: Negative for pain, discharge and visual disturbance.  Respiratory: Negative for cough, chest tightness, wheezing and stridor.   Cardiovascular: Negative for chest pain, palpitations and leg swelling.  Gastrointestinal: Negative for abdominal distention, abdominal pain, blood in stool, constipation, diarrhea, nausea and vomiting.  Genitourinary: Negative for difficulty urinating, discharge, flank pain, genital sores, hematuria and urgency.  Musculoskeletal: Negative for arthralgias, back pain, gait problem, joint swelling, myalgias and neck stiffness.  Skin: Negative for rash.  Neurological: Negative for dizziness, syncope, speech difficulty, weakness, numbness and headaches.  Hematological: Negative for adenopathy. Does not bruise/bleed easily.  Psychiatric/Behavioral: Negative for behavioral problems and  dysphoric mood. The patient is not nervous/anxious.        Objective:   Physical Exam  Constitutional: He appears well-developed and well-nourished.  HENT:  Head: Normocephalic and atraumatic.  Right Ear: External ear normal.  Left Ear: External ear normal.  Nose: Nose normal.  Mouth/Throat: Oropharynx is clear and moist.  Eyes: Conjunctivae and EOM are normal. Pupils are equal, round, and reactive to light. No scleral icterus.  Neck: Normal range of motion. Neck supple. No JVD present. No thyromegaly present.  Cardiovascular: Regular rhythm, normal heart sounds and intact distal pulses.  Exam reveals no gallop and no friction rub.   No murmur heard. Pulmonary/Chest: Effort normal and breath sounds normal. He exhibits no tenderness.  Abdominal: Soft. Bowel sounds are normal. He exhibits no distension and no mass. There is no tenderness.  Genitourinary: Prostate normal and penis normal. Rectal exam shows guaiac negative stool.  Genitourinary Comments: Prostate slightly enlarged, smooth and symmetrical  Musculoskeletal: Normal range of motion. He exhibits  no edema or tenderness.  Lymphadenopathy:    He has no cervical adenopathy.  Neurological: He is alert. He has normal reflexes. No cranial nerve deficit. Coordination normal.  Skin: Skin is warm and dry. No rash noted.  Psychiatric: He has a normal mood and affect. His behavior is normal.          Assessment & Plan:   Preventive health exam Medicare wellness visit Hypertension.  Excellent control Diabetes mellitus.  Well-controlled.  We'll decrease metformin to 1 g daily Dyslipidemia.  Continue statin therapy History diastolic heart failure, compensated  Follow-up 6 months  Jesi Jurgens Pilar Plate

## 2016-11-02 ENCOUNTER — Encounter: Payer: Self-pay | Admitting: Internal Medicine

## 2016-11-04 NOTE — Telephone Encounter (Signed)
Pt following up on the change of med request   valsartan (DIOVAN) 320 MG tablet To be switched to another med. Pt going out of town on Sunday and needs by tomorrow please!    [facility] [place], [place]

## 2016-11-05 ENCOUNTER — Other Ambulatory Visit: Payer: Self-pay | Admitting: Internal Medicine

## 2016-11-05 ENCOUNTER — Encounter: Payer: Self-pay | Admitting: Internal Medicine

## 2016-11-05 MED ORDER — LOSARTAN POTASSIUM 100 MG PO TABS: 100.0000 mg | ORAL_TABLET | Freq: Every day | ORAL | 0 refills | Status: DC

## 2016-11-05 MED ORDER — LOSARTAN POTASSIUM 100 MG PO TABS
100.0000 mg | ORAL_TABLET | Freq: Every day | ORAL | 3 refills | Status: DC
Start: 2016-11-05 — End: 2016-11-05

## 2016-11-05 NOTE — Telephone Encounter (Addendum)
Pt is calling back again checking up on the below request. Please call patient

## 2016-11-30 ENCOUNTER — Other Ambulatory Visit: Payer: Self-pay | Admitting: Internal Medicine

## 2016-11-30 ENCOUNTER — Encounter: Payer: Self-pay | Admitting: Internal Medicine

## 2016-11-30 MED ORDER — ATORVASTATIN CALCIUM 40 MG PO TABS: 40.0000 mg | ORAL_TABLET | Freq: Every day | ORAL | 3 refills | Status: DC

## 2016-11-30 MED ORDER — AMLODIPINE BESYLATE 2.5 MG PO TABS: 2.5000 mg | ORAL_TABLET | Freq: Every day | ORAL | 3 refills | Status: DC

## 2016-12-23 ENCOUNTER — Encounter: Payer: Self-pay | Admitting: Internal Medicine

## 2017-01-08 ENCOUNTER — Encounter: Payer: Self-pay | Admitting: Internal Medicine

## 2017-01-11 ENCOUNTER — Encounter: Payer: Self-pay | Admitting: Internal Medicine

## 2017-01-27 ENCOUNTER — Ambulatory Visit (INDEPENDENT_AMBULATORY_CARE_PROVIDER_SITE_OTHER): Payer: Medicare Other | Admitting: *Deleted

## 2017-01-27 DIAGNOSIS — Z23 Encounter for immunization: Secondary | ICD-10-CM

## 2017-01-31 ENCOUNTER — Encounter: Payer: Self-pay | Admitting: Internal Medicine

## 2017-02-26 ENCOUNTER — Other Ambulatory Visit: Payer: Self-pay | Admitting: Internal Medicine

## 2017-03-08 ENCOUNTER — Encounter: Payer: Self-pay | Admitting: Internal Medicine

## 2017-03-08 ENCOUNTER — Ambulatory Visit: Payer: Medicare Other | Admitting: Internal Medicine

## 2017-03-08 VITALS — BP 122/70 | HR 62 | Temp 97.9°F | Ht 66.0 in | Wt 187.6 lb

## 2017-03-08 DIAGNOSIS — E1121 Type 2 diabetes mellitus with diabetic nephropathy: Secondary | ICD-10-CM | POA: Diagnosis not present

## 2017-03-08 DIAGNOSIS — E119 Type 2 diabetes mellitus without complications: Secondary | ICD-10-CM

## 2017-03-08 DIAGNOSIS — E785 Hyperlipidemia, unspecified: Secondary | ICD-10-CM | POA: Diagnosis not present

## 2017-03-08 DIAGNOSIS — I10 Essential (primary) hypertension: Secondary | ICD-10-CM | POA: Diagnosis not present

## 2017-03-08 LAB — POCT GLYCOSYLATED HEMOGLOBIN (HGB A1C): Hemoglobin A1C: 5.8

## 2017-03-08 NOTE — Progress Notes (Signed)
Subjective:    Patient ID: Ralph Bell, male    DOB: 02/24/1941, 76 y.o.   MRN: [id number]  HPI  Lab Results  Component Value Date   HGBA1C 5.8 03/08/2017    76 year old patient who is seen today for follow-up of type 2 diabetes.  He has essential hypertension and a history of dyslipidemia.  Remains on statin therapy.  He denies any cardiopulmonary complaints.  He has a history of coronary artery disease which has been stable.  Hemoglobin A1c well controlled today.  Remains quite active with international travel.  Past Medical History:  Diagnosis Date  . ALLERGIC RHINITIS 11/09/2006  . ASTHMA 11/16/2006  . DIABETES MELLITUS, TYPE II 11/09/2006  . DIVERTICULITIS, HX OF 11/16/2006  . ED (erectile dysfunction)   . Family hx colonic polyps   . History of kidney stones   . HYPERLIPIDEMIA 11/09/2006  . HYPERTENSION 11/09/2006  . ISCHEMIA 08/20/2008  . Rosacea   . Shortness of breath 09/05/2008     Social History   Socioeconomic History  . Marital status: Married    Spouse name: Not on file  . Number of children: Not on file  . Years of education: Not on file  . Highest education level: Not on file  Social Needs  . Financial resource strain: Not on file  . Food insecurity - worry: Not on file  . Food insecurity - inability: Not on file  . Transportation needs - medical: Not on file  . Transportation needs - non-medical: Not on file  Occupational History  . Not on file  Tobacco Use  . Smoking status: Former Smoker    Last attempt to quit: 04/05/1973    Years since quitting: 43.9  . Smokeless tobacco: Former Systems developer    Quit date: 04/05/1978  Substance and Sexual Activity  . Alcohol use: Yes  . Drug use: No  . Sexual activity: Not on file  Other Topics Concern  . Not on file  Social History Narrative  . Not on file    History reviewed. No pertinent surgical history.  History reviewed. No pertinent family history.  Allergies  Allergen Reactions  . Sulfacetamide Sodium      Current Outpatient Medications on File Prior to Visit  Medication Sig Dispense Refill  . allopurinol (ZYLOPRIM) 100 MG tablet TAKE 1 TABLET BY MOUTH  DAILY 90 tablet 0  . amLODipine (NORVASC) 2.5 MG tablet Take 1 tablet (2.5 mg total) by mouth daily. 90 tablet 3  . aspirin 81 MG tablet Take 81 mg by mouth daily.      [name] Kitchen atorvastatin (LIPITOR) 40 MG tablet Take 1 tablet (40 mg total) by mouth daily. 90 tablet 3  . doxazosin (CARDURA) 8 MG tablet TAKE 1 TABLET BY MOUTH AT  BEDTIME 90 tablet 0  . fexofenadine (ALLEGRA) 180 MG tablet Take 1 tablet (180 mg total) by mouth daily. 90 tablet 3  . glucose blood (ACCU-CHEK AVIVA) test strip Use daily as directed 100 each 6  . metFORMIN (GLUCOPHAGE-XR) 500 MG 24 hr tablet Take 2 tablets (1,000 mg total) by mouth daily with breakfast. 360 tablet 3   No current facility-administered medications on file prior to visit.     BP 122/70 (BP Location: Left Arm, Patient Position: Sitting, Cuff Size: Normal)   Pulse 62   Temp 97.9 F (36.6 C) (Oral)   Ht 5\' 6"  (1.676 m)   Wt 187 lb 9.6 oz (85.1 kg)   SpO2 98%   BMI 30.28 kg/m  Review of Systems  Constitutional: Negative for appetite change, chills, fatigue and fever.  HENT: Negative for congestion, dental problem, ear pain, hearing loss, sore throat, tinnitus, trouble swallowing and voice change.   Eyes: Negative for pain, discharge and visual disturbance.  Respiratory: Negative for cough, chest tightness, wheezing and stridor.   Cardiovascular: Negative for chest pain, palpitations and leg swelling.  Gastrointestinal: Negative for abdominal distention, abdominal pain, blood in stool, constipation, diarrhea, nausea and vomiting.  Genitourinary: Negative for difficulty urinating, discharge, flank pain, genital sores, hematuria and urgency.  Musculoskeletal: Negative for arthralgias, back pain, gait problem, joint swelling, myalgias and neck stiffness.  Skin: Negative for rash.  Neurological:  Negative for dizziness, syncope, speech difficulty, weakness, numbness and headaches.  Hematological: Negative for adenopathy. Does not bruise/bleed easily.  Psychiatric/Behavioral: Negative for behavioral problems and dysphoric mood. The patient is not nervous/anxious.        Objective:   Physical Exam  Constitutional: He is oriented to person, place, and time. He appears well-developed.  HENT:  Head: Normocephalic.  Right Ear: External ear normal.  Left Ear: External ear normal.  Eyes: Conjunctivae and EOM are normal.  Neck: Normal range of motion.  Cardiovascular: Normal rate and normal heart sounds.  Pulmonary/Chest: Breath sounds normal.  Abdominal: Bowel sounds are normal.  Musculoskeletal: Normal range of motion. He exhibits no edema or tenderness.  Neurological: He is alert and oriented to person, place, and time.  Psychiatric: He has a normal mood and affect. His behavior is normal.          Assessment & Plan:   Diabetes mellitus.  Excellent control Hypertension well-controlled Chronic diastolic heart failure compensated Dyslipidemia continue statin therapy  No change in medical regimen CPX 6 months  [email or web address],PETER Pilar Plate

## 2017-03-08 NOTE — Patient Instructions (Signed)
Limit your sodium (Salt) intake    It is important that you exercise regularly, at least 20 minutes 3 to 4 times per week.  If you develop chest pain or shortness of breath seek  medical attention.   Please check your hemoglobin A1c every 6 months  Return in 6 months for follow-up

## 2017-05-24 ENCOUNTER — Other Ambulatory Visit: Payer: Self-pay | Admitting: Internal Medicine

## 2017-06-06 ENCOUNTER — Encounter: Payer: Self-pay | Admitting: Internal Medicine

## 2017-06-07 ENCOUNTER — Encounter: Payer: Self-pay | Admitting: Internal Medicine

## 2017-08-25 DIAGNOSIS — M5136 Other intervertebral disc degeneration, lumbar region: Secondary | ICD-10-CM | POA: Diagnosis not present

## 2017-08-25 DIAGNOSIS — M48061 Spinal stenosis, lumbar region without neurogenic claudication: Secondary | ICD-10-CM | POA: Diagnosis not present

## 2017-08-25 DIAGNOSIS — M545 Low back pain: Secondary | ICD-10-CM | POA: Diagnosis not present

## 2017-09-03 ENCOUNTER — Other Ambulatory Visit: Payer: Self-pay | Admitting: Internal Medicine

## 2017-09-08 ENCOUNTER — Encounter: Payer: Self-pay | Admitting: Internal Medicine

## 2017-09-08 ENCOUNTER — Ambulatory Visit (INDEPENDENT_AMBULATORY_CARE_PROVIDER_SITE_OTHER): Payer: Medicare Other | Admitting: Internal Medicine

## 2017-09-08 VITALS — BP 132/80 | HR 44 | Temp 97.9°F | Ht 67.0 in | Wt 193.0 lb

## 2017-09-08 DIAGNOSIS — Z0000 Encounter for general adult medical examination without abnormal findings: Secondary | ICD-10-CM | POA: Diagnosis not present

## 2017-09-08 DIAGNOSIS — Z860100 Personal history of colon polyps, unspecified: Secondary | ICD-10-CM | POA: Insufficient documentation

## 2017-09-08 DIAGNOSIS — E119 Type 2 diabetes mellitus without complications: Secondary | ICD-10-CM | POA: Diagnosis not present

## 2017-09-08 DIAGNOSIS — E785 Hyperlipidemia, unspecified: Secondary | ICD-10-CM

## 2017-09-08 DIAGNOSIS — M1000 Idiopathic gout, unspecified site: Secondary | ICD-10-CM

## 2017-09-08 DIAGNOSIS — I10 Essential (primary) hypertension: Secondary | ICD-10-CM | POA: Diagnosis not present

## 2017-09-08 DIAGNOSIS — E0821 Diabetes mellitus due to underlying condition with diabetic nephropathy: Secondary | ICD-10-CM

## 2017-09-08 LAB — CBC WITH DIFFERENTIAL/PLATELET
Basophils Absolute: 0 10*3/uL (ref 0.0–0.1)
Basophils Relative: 0.5 % (ref 0.0–3.0)
Eosinophils Absolute: 0.2 10*3/uL (ref 0.0–0.7)
Eosinophils Relative: 2.6 % (ref 0.0–5.0)
HCT: 40.4 % (ref 39.0–52.0)
Hemoglobin: 13.7 g/dL (ref 13.0–17.0)
Lymphocytes Relative: 24.5 % (ref 12.0–46.0)
Lymphs Abs: 1.5 10*3/uL (ref 0.7–4.0)
MCHC: 34 g/dL (ref 30.0–36.0)
MCV: 88.2 fl (ref 78.0–100.0)
Monocytes Absolute: 0.5 10*3/uL (ref 0.1–1.0)
Monocytes Relative: 7.6 % (ref 3.0–12.0)
Neutro Abs: 3.9 10*3/uL (ref 1.4–7.7)
Neutrophils Relative %: 64.8 % (ref 43.0–77.0)
Platelets: 160 10*3/uL (ref 150.0–400.0)
RBC: 4.58 Mil/uL (ref 4.22–5.81)
RDW: 14.2 % (ref 11.5–15.5)
WBC: 6.1 10*3/uL (ref 4.0–10.5)

## 2017-09-08 LAB — COMPREHENSIVE METABOLIC PANEL
ALT: 25 U/L (ref 0–53)
AST: 24 U/L (ref 0–37)
Albumin: 4.2 g/dL (ref 3.5–5.2)
Alkaline Phosphatase: 63 U/L (ref 39–117)
BUN: 40 mg/dL — ABNORMAL HIGH (ref 6–23)
CO2: 26 mEq/L (ref 19–32)
Calcium: 9.9 mg/dL (ref 8.4–10.5)
Chloride: 105 mEq/L (ref 96–112)
Creatinine, Ser: 1.58 mg/dL — ABNORMAL HIGH (ref 0.40–1.50)
GFR: 45.51 mL/min — ABNORMAL LOW (ref >60.00)
Glucose, Bld: 129 mg/dL — ABNORMAL HIGH (ref 70–99)
Potassium: 4.9 mEq/L (ref 3.5–5.1)
Sodium: 140 mEq/L (ref 135–145)
Total Bilirubin: 0.6 mg/dL (ref 0.2–1.2)
Total Protein: 6.8 g/dL (ref 6.0–8.3)

## 2017-09-08 LAB — MICROALBUMIN / CREATININE URINE RATIO
Creatinine,U: 133.8 mg/dL
Microalb Creat Ratio: 69 mg/g — ABNORMAL HIGH (ref 0.0–30.0)
Microalb, Ur: 92.4 mg/dL — ABNORMAL HIGH (ref 0.0–1.9)

## 2017-09-08 LAB — LIPID PANEL
Cholesterol: 123 mg/dL (ref 0–200)
HDL: 45 mg/dL (ref >39.00)
LDL Cholesterol: 55 mg/dL (ref 0–99)
NonHDL: 77.94
Total CHOL/HDL Ratio: 3
Triglycerides: 113 mg/dL (ref 0.0–149.0)
VLDL: 22.6 mg/dL (ref 0.0–40.0)

## 2017-09-08 LAB — HEMOGLOBIN A1C: Hgb A1c MFr Bld: 6.3 % (ref 4.6–6.5)

## 2017-09-08 LAB — TSH: TSH: 1.39 u[IU]/mL (ref 0.35–4.50)

## 2017-09-08 MED ORDER — LOSARTAN POTASSIUM 25 MG PO TABS: 25.0000 mg | ORAL_TABLET | Freq: Every day | ORAL | 4 refills | Status: DC

## 2017-09-08 NOTE — Progress Notes (Signed)
Subjective:    Patient ID: Ralph Bell, male    DOB: 09-29-1940, 77 y.o.   MRN: [id number]  HPI   77 year old patient who is seen today for a annual preventive health examination as well as a subsequent Medicare wellness visit  He continues to do quite well.  His diabetic status has been very well controlled.  He is scheduled for a diabetic eye examination later this month.  He is scheduled for follow-up colonoscopy in 2020. He does have a history of colonic polyps. He has essential hypertension BPH and remains on statin therapy. His diabetes has been well controlled on a submaximal dose of metformin. No cardiopulmonary complaints   Past Medical History:  Diagnosis Date  . ALLERGIC RHINITIS 11/09/2006  . ASTHMA 11/16/2006  . DIABETES MELLITUS, TYPE II 11/09/2006  . DIVERTICULITIS, HX OF 11/16/2006  . ED (erectile dysfunction)   . Family hx colonic polyps   . History of kidney stones   . HYPERLIPIDEMIA 11/09/2006  . HYPERTENSION 11/09/2006  . ISCHEMIA 08/20/2008  . Rosacea   . Shortness of breath 09/05/2008     Social History   Socioeconomic History  . Marital status: Married    Spouse name: Not on file  . Number of children: Not on file  . Years of education: Not on file  . Highest education level: Not on file  Occupational History  . Not on file  Social Needs  . Financial resource strain: Not on file  . Food insecurity:    Worry: Not on file    Inability: Not on file  . Transportation needs:    Medical: Not on file    Non-medical: Not on file  Tobacco Use  . Smoking status: Former Smoker    Last attempt to quit: 04/05/1973    Years since quitting: 44.4  . Smokeless tobacco: Former Systems developer    Quit date: 04/05/1978  Substance and Sexual Activity  . Alcohol use: Yes  . Drug use: No  . Sexual activity: Not on file  Lifestyle  . Physical activity:    Days per week: Not on file    Minutes per session: Not on file  . Stress: Not on file  Relationships  . Social  connections:    Talks on phone: Not on file    Gets together: Not on file    Attends religious service: Not on file    Active member of club or organization: Not on file    Attends meetings of clubs or organizations: Not on file    Relationship status: Not on file  . Intimate partner violence:    Fear of current or ex partner: Not on file    Emotionally abused: Not on file    Physically abused: Not on file    Forced sexual activity: Not on file  Other Topics Concern  . Not on file  Social History Narrative  . Not on file    No past surgical history on file.  No family history on file.  Allergies  Allergen Reactions  . Sulfacetamide Sodium     Current Outpatient Medications on File Prior to Visit  Medication Sig Dispense Refill  . allopurinol (ZYLOPRIM) 100 MG tablet TAKE 1 TABLET BY MOUTH  DAILY 90 tablet 0  . amLODipine (NORVASC) 2.5 MG tablet TAKE 1 TABLET BY MOUTH  DAILY 90 tablet 3  . aspirin 81 MG tablet Take 81 mg by mouth daily.      [name] Kitchen atorvastatin (LIPITOR) 40 MG  tablet Take 1 tablet (40 mg total) by mouth daily. 90 tablet 3  . doxazosin (CARDURA) 8 MG tablet TAKE 1 TABLET BY MOUTH AT  BEDTIME 90 tablet 0  . fexofenadine (ALLEGRA) 180 MG tablet Take 1 tablet (180 mg total) by mouth daily. 90 tablet 3  . glucose blood (ACCU-CHEK AVIVA) test strip Use daily as directed 100 each 6  . metFORMIN (GLUCOPHAGE-XR) 500 MG 24 hr tablet Take 2 tablets (1,000 mg total) by mouth daily with breakfast. 360 tablet 3   No current facility-administered medications on file prior to visit.     BP 132/80 (BP Location: Right Arm, Patient Position: Sitting, Cuff Size: Large)   Pulse (!) 44   Temp 97.9 F (36.6 C) (Oral)   Ht 5\' 7"  (1.702 m)   Wt 193 lb (87.5 kg)   SpO2 98%   BMI 30.23 kg/m   Subsequent Medicare wellness visit  1. Risk factors, based on past  M,S,F history.  Cardiovascular risk factors include diabetes hypertension and dyslipidemia.  Patient did have a stress  Myoview in 2010    2.  Physical activities: Remains quite active without restrictions.  Does go to his health club 3 times per week active with walking and travel  3.  Depression/mood: No history of major depression or mood disorder  4.  Hearing: No significant deficits  5.  ADL's: Independent  6.  Fall risk: Low  7.  Home safety: No problems identified  8.  Height weight, and visual acuity; height and weight stable no change in visual acuity is scheduled for follow-up eye examination on September 21, 2017  9.  Counseling: Continue heart healthy diet and active lifestyle  10. Lab orders based on risk factors: Laboratory update will be reviewed including hemoglobin A1c urine for microalbumin renal indices and lipid profile  11. Referral : Follow-up ophthalmology  12. Care plan: Continue efforts at aggressive risk factor modification  13. Cognitive assessment: Alert and oriented with normal affect.  No cognitive dysfunction GI primary care ophthalmology  14. Screening: Patient provided with a written and personalized 5-10 year screening schedule in the AVS.    15. Provider List Update:     Review of Systems  Constitutional: Negative for appetite change, chills, fatigue and fever.  HENT: Negative for congestion, dental problem, ear pain, hearing loss, sore throat, tinnitus, trouble swallowing and voice change.   Eyes: Negative for pain, discharge and visual disturbance.  Respiratory: Negative for cough, chest tightness, wheezing and stridor.   Cardiovascular: Negative for chest pain, palpitations and leg swelling.  Gastrointestinal: Negative for abdominal distention, abdominal pain, blood in stool, constipation, diarrhea, nausea and vomiting.  Genitourinary: Negative for difficulty urinating, discharge, flank pain, genital sores, hematuria and urgency.  Musculoskeletal: Negative for arthralgias, back pain, gait problem, joint swelling, myalgias and neck stiffness.  Skin: Negative for  rash.  Neurological: Negative for dizziness, syncope, speech difficulty, weakness, numbness and headaches.  Hematological: Negative for adenopathy. Does not bruise/bleed easily.  Psychiatric/Behavioral: Negative for behavioral problems and dysphoric mood. The patient is not nervous/anxious.        Objective:   Physical Exam  Constitutional: He is oriented to person, place, and time. He appears well-developed.  HENT:  Head: Normocephalic.  Right Ear: External ear normal.  Left Ear: External ear normal.  Eyes: Conjunctivae and EOM are normal.  Neck: Normal range of motion.  Cardiovascular: Normal rate, normal heart sounds and intact distal pulses.  Pedal pulses full  Pulmonary/Chest: Breath sounds  normal.  Abdominal: Bowel sounds are normal.  Genitourinary: Rectal exam shows guaiac negative stool.  Genitourinary Comments: +2 symmetrically enlarged  Musculoskeletal: Normal range of motion. He exhibits no edema or tenderness.  Neurological: He is alert and oriented to person, place, and time.  Skin: Skin is warm and dry.  Psychiatric: He has a normal mood and affect. His behavior is normal.          Assessment & Plan:  Preventive health examination Subsequent Medicare wellness visit Diabetes mellitus type 2.  Will review a hemoglobin A1c urine for microalbumin Dyslipidemia check a lipid profile Essential hypertension stable History of asthma stable History of colonic polyps.  Follow-up colonoscopy 1 year  Nyoka Cowden

## 2017-09-08 NOTE — Patient Instructions (Signed)
Limit your sodium (Salt) intake   Please check your hemoglobin A1c every 3-6 months  Please see your eye doctor yearly to check for diabetic eye damage  Return in 6 months for follow-up

## 2017-09-19 ENCOUNTER — Encounter: Payer: Self-pay | Admitting: Internal Medicine

## 2017-09-21 ENCOUNTER — Encounter: Payer: Self-pay | Admitting: Internal Medicine

## 2017-09-21 ENCOUNTER — Other Ambulatory Visit: Payer: Self-pay | Admitting: Internal Medicine

## 2017-09-21 DIAGNOSIS — H52223 Regular astigmatism, bilateral: Secondary | ICD-10-CM | POA: Diagnosis not present

## 2017-09-21 DIAGNOSIS — E119 Type 2 diabetes mellitus without complications: Secondary | ICD-10-CM | POA: Diagnosis not present

## 2017-09-21 LAB — HM DIABETES EYE EXAM

## 2017-09-21 MED ORDER — LOSARTAN POTASSIUM 100 MG PO TABS: 100.0000 mg | ORAL_TABLET | Freq: Every day | ORAL | 4 refills | Status: DC

## 2017-09-21 NOTE — Telephone Encounter (Signed)
Dr. Kwiatkowski please advise. 

## 2017-09-28 ENCOUNTER — Telehealth: Payer: Self-pay

## 2017-09-28 NOTE — Telephone Encounter (Signed)
Phone lines are down.

## 2017-09-28 NOTE — Telephone Encounter (Signed)
Copied from [place] [id number]. Topic: General - Other >> Sep 28, 2017 10:57 AM Yvette Rack wrote: Reason for CRM: Shanon Brow with [facility] requesting clarification on Rx for losartan (COZAAR) 100 MG tablet. Requests a call back. Cb# [phone]  Reference# [id number]

## 2017-09-29 NOTE — Telephone Encounter (Signed)
Rx verified. No further information needed.

## 2017-10-07 ENCOUNTER — Encounter: Payer: Self-pay | Admitting: Internal Medicine

## 2017-10-30 ENCOUNTER — Other Ambulatory Visit: Payer: Self-pay | Admitting: Internal Medicine

## 2017-12-18 ENCOUNTER — Other Ambulatory Visit: Payer: Self-pay | Admitting: Internal Medicine

## 2017-12-19 ENCOUNTER — Other Ambulatory Visit: Payer: Self-pay | Admitting: Internal Medicine

## 2017-12-28 ENCOUNTER — Encounter: Payer: Self-pay | Admitting: Adult Health

## 2018-03-02 ENCOUNTER — Other Ambulatory Visit: Payer: Self-pay | Admitting: Internal Medicine

## 2018-03-08 ENCOUNTER — Encounter: Payer: Medicare Other | Admitting: Adult Health

## 2018-09-22 ENCOUNTER — Telehealth (HOSPITAL_COMMUNITY): Payer: Self-pay | Admitting: *Deleted

## 2018-09-22 NOTE — Telephone Encounter (Signed)
The above patient or their representative was contacted and gave the following answers to these questions:         Do you have any of the following symptoms?  no  Fever                    Cough                   Shortness of breath  Do  you have any of the following other symptoms? no   muscle pain         vomiting,        diarrhea        rash         weakness        red eye        abdominal pain         bruising          bruising or bleeding              joint pain           severe headache    Have you been in contact with someone who was or has been sick in the past 2 weeks?  no  Yes                 Unsure                         Unable to assess   Does the person that you were in contact with have any of the following symptoms?   Cough         shortness of breath           muscle pain         vomiting,            diarrhea            rash            weakness           fever            red eye           abdominal pain           bruising  or  bleeding                joint pain                severe headache               Have you  or someone you have been in contact with traveled internationally in th last month?   no      If yes, which countries?   Have you  or someone you have been in contact with traveled outside [place] in th last month?   no      If yes, which state and city?   COMMENTS OR ACTION PLAN FOR THIS PATIENT:          

## 2018-09-25 ENCOUNTER — Other Ambulatory Visit: Payer: Self-pay

## 2018-09-25 ENCOUNTER — Other Ambulatory Visit (HOSPITAL_COMMUNITY): Payer: Self-pay | Admitting: Internal Medicine

## 2018-09-25 ENCOUNTER — Ambulatory Visit (HOSPITAL_COMMUNITY)
Admission: RE | Admit: 2018-09-25 | Discharge: 2018-09-25 | Disposition: A | Discharge status: Home / Self Care | Payer: Medicare Other | Source: Ambulatory Visit | Attending: Family | Admitting: Family

## 2018-09-25 DIAGNOSIS — Z136 Encounter for screening for cardiovascular disorders: Secondary | ICD-10-CM | POA: Diagnosis not present

## 2018-09-25 DIAGNOSIS — Z0000 Encounter for general adult medical examination without abnormal findings: Secondary | ICD-10-CM

## 2018-10-13 ENCOUNTER — Other Ambulatory Visit: Payer: Self-pay | Admitting: Internal Medicine

## 2018-10-13 DIAGNOSIS — R809 Proteinuria, unspecified: Secondary | ICD-10-CM

## 2018-10-18 ENCOUNTER — Ambulatory Visit
Admission: RE | Admit: 2018-10-18 | Discharge: 2018-10-18 | Disposition: A | Discharge status: Home / Self Care | Payer: Medicare Other | Source: Ambulatory Visit | Attending: Internal Medicine | Admitting: Internal Medicine

## 2018-10-18 ENCOUNTER — Telehealth: Payer: Self-pay

## 2018-10-18 DIAGNOSIS — R809 Proteinuria, unspecified: Secondary | ICD-10-CM

## 2018-10-18 NOTE — Telephone Encounter (Signed)

## 2018-10-19 ENCOUNTER — Encounter: Payer: Self-pay | Admitting: *Deleted

## 2018-10-19 ENCOUNTER — Encounter: Payer: Self-pay | Admitting: Cardiology

## 2018-10-19 ENCOUNTER — Other Ambulatory Visit: Payer: Self-pay

## 2018-10-19 ENCOUNTER — Ambulatory Visit (INDEPENDENT_AMBULATORY_CARE_PROVIDER_SITE_OTHER): Payer: Medicare Other | Admitting: Cardiology

## 2018-10-19 VITALS — BP 140/80 | HR 57 | Ht 67.0 in | Wt 191.4 lb

## 2018-10-19 DIAGNOSIS — R001 Bradycardia, unspecified: Secondary | ICD-10-CM

## 2018-10-19 DIAGNOSIS — I2583 Coronary atherosclerosis due to lipid rich plaque: Secondary | ICD-10-CM | POA: Diagnosis not present

## 2018-10-19 DIAGNOSIS — R0602 Shortness of breath: Secondary | ICD-10-CM | POA: Diagnosis not present

## 2018-10-19 DIAGNOSIS — I2510 Atherosclerotic heart disease of native coronary artery without angina pectoris: Secondary | ICD-10-CM | POA: Diagnosis not present

## 2018-10-19 NOTE — Progress Notes (Signed)
Cardiology Office Note:    Date:  10/19/2018   ID:  Ralph Bell, DOB 07/21/1940, MRN [id number]  PCP:  Ralph Redwood, MD  Cardiologist:  Ralph Furbish, MD  Electrophysiologist:  None   Referring MD: Ralph Redwood, MD   Here for the evaluation of shortness of breath and bradycardia at the request of Dr. Brigitte Bell  History of Present Illness:    Ralph Bell is a 78 y.o. male with a hx of former smoking with prior elevated BUN, decreased platelets, elevated creatinine and cholesterol below standard with recent tele visit with Dr. Lang Bell.  Creatinine was 1.58  Has been experiencing some shortness of breath with activity with his Bell running in the 40s and 50s up to 65-70 after exercise.  No syncope, no lightheadedness.  Back many years ago when he ran his treadmill he was unable to get his heart rate above 100 during ETT.   SOB with yard work   125/71 - HR 61. Usuall 40's HR  Coronary calcium score was done in 2010 and was 171 with calcium in the LAD, less than 25% stenosis at the time.  His mother had multiple myeloma and end-stage renal disease.  Daughter was diagnosed with breast cancer at age of 67.  He goes by the name of Ralph Bell.  He works for [employer] for 63 years and retired at the age of 32.  Quit smoking back in 1975.  LDL 40 HDL 40 triglycerides 96 total cholesterol 99, hemoglobin 12.6, creatinine 2.2 sodium 142 TSH 1.25    Past Medical History:  Diagnosis Date  . ALLERGIC RHINITIS 11/09/2006  . ASTHMA 11/16/2006  . DIABETES MELLITUS, TYPE II 11/09/2006  . DIVERTICULITIS, HX OF 11/16/2006  . ED (erectile dysfunction)   . Family hx colonic polyps   . History of kidney stones   . HYPERLIPIDEMIA 11/09/2006  . HYPERTENSION 11/09/2006  . ISCHEMIA 08/20/2008  . Rosacea   . Shortness of breath 09/05/2008    History reviewed. No pertinent surgical history.  Current Medications: Current Meds  Medication Sig  . allopurinol (ZYLOPRIM) 100 MG  tablet TAKE 1 TABLET BY MOUTH  DAILY  . amLODipine (NORVASC) 2.5 MG tablet TAKE 1 TABLET BY MOUTH  DAILY  . aspirin 81 MG tablet Take 81 mg by mouth daily.    [name] Kitchen atorvastatin (LIPITOR) 40 MG tablet TAKE 1 TABLET BY MOUTH  DAILY  . doxazosin (CARDURA) 8 MG tablet TAKE 1 TABLET BY MOUTH AT  BEDTIME  . fexofenadine (ALLEGRA) 180 MG tablet Take 1 tablet (180 mg total) by mouth daily.  [name] Kitchen glucose blood (ACCU-CHEK AVIVA) test strip Use daily as directed  . olmesartan (BENICAR) 40 MG tablet Take 40 mg by mouth daily.     Allergies:   Sulfacetamide sodium   Social History   Socioeconomic History  . Marital status: Married    Spouse name: Not on file  . Number of children: Not on file  . Years of education: Not on file  . Highest education level: Not on file  Occupational History  . Not on file  Social Needs  . Financial resource strain: Not on file  . Food insecurity    Worry: Not on file    Inability: Not on file  . Transportation needs    Medical: Not on file    Non-medical: Not on file  Tobacco Use  . Smoking status: Former Smoker    Quit date: 04/05/1973    Years since  quitting: 45.5  . Smokeless tobacco: Former Systems developer    Quit date: 04/05/1978  Substance and Sexual Activity  . Alcohol use: Yes  . Drug use: No  . Sexual activity: Not on file  Lifestyle  . Physical activity    Days per week: Not on file    Minutes per session: Not on file  . Stress: Not on file  Relationships  . Social Herbalist on phone: Not on file    Gets together: Not on file    Attends religious service: Not on file    Active member of club or organization: Not on file    Attends meetings of clubs or organizations: Not on file    Relationship status: Not on file  Other Topics Concern  . Not on file  Social History Narrative  . Not on file     Family History: The patient's family history is not on file.  As above in HPI.  No early family history of CAD  ROS:   Please see the history of  present illness.    No fevers chills nausea vomiting syncope bleeding all other systems reviewed and are negative.  EKGs/Labs/Other Studies Reviewed:    The following studies were reviewed today: Stress test echocardiogram Holter monitor pending  EKG:  EKG is  ordered today.  The ekg ordered today demonstrates sinus bradycardia 57 with nonspecific ST-T wave changes.  Recent Labs: No results found for requested labs within last 8760 hours.  Recent Lipid Panel    Component Value Date/Time   CHOL 123 09/08/2017 1025   TRIG 113.0 09/08/2017 1025   HDL 45.00 09/08/2017 1025   CHOLHDL 3 09/08/2017 1025   VLDL 22.6 09/08/2017 1025   LDLCALC 55 09/08/2017 1025    Physical Exam:    VS:  BP 140/80   Bell (!) 57   Ht '5\' 7"'  (1.702 m)   Wt 191 lb 6.4 oz (86.8 kg)   SpO2 98%   BMI 29.98 kg/m     Wt Readings from Last 3 Encounters:  10/19/18 191 lb 6.4 oz (86.8 kg)  09/08/17 193 lb (87.5 kg)  03/08/17 187 lb 9.6 oz (85.1 kg)     GEN:  Well nourished, well developed in no acute distress HEENT: Normal NECK: No JVD; No carotid bruits LYMPHATICS: No lymphadenopathy CARDIAC: Mild brady RRR, no murmurs, rubs, gallops RESPIRATORY:  Clear to auscultation without rales, wheezing or rhonchi  ABDOMEN: Soft, non-tender, non-distended MUSCULOSKELETAL:  No edema; No deformity  SKIN: Warm and dry NEUROLOGIC:  Alert and oriented x 3 PSYCHIATRIC:  Normal affect   ASSESSMENT:    1. Shortness of breath   2. Bradycardia, sinus   3. Coronary artery disease due to lipid rich plaque    PLAN:    In order of problems listed above:  Chronic kidney disease stage IV - GFR 29-creatinine has increased from 1.6-2.2.  Avoid NSAIDs.  Seen the nephrologist today.  Has some proteinuria.  Likely diabetic related.  Coronary atherosclerosis/CAD/DOE - Prior minor LAD calcification with a score of 171 in 2010.  Since he has been having more dyspnea on exertion, I think it makes sense for [place] to proceed  with pharmacologic stress test, Lexiscan.  Since his creatinine is 2.2, I will avoid CT scan with contrast agent. We will also get an echocardiogram to ensure proper structure and function.  I will also check a 24-hour Holter monitor to ensure chronotropic competence.  I have asked him to  do some exercise while this is being monitored.  Hyperlipidemia -LDL 40, excellent less than 70.  Continue with high intensity statin atorvastatin 40 mg.  Hypertension with DM - Olmesartan Cardura amlodipine.  Previously was on losartan but now is on a more potent ARB.  Blood pressure still slightly above goal.  Bradycardia - Resting bradycardia in the 40s, no shortness of breath with this, no syncope.  He may have a degree of chronotropic incompetence.  I will order him a 24-hour Holter monitor to see how his heart rate increases during physical activity.  Minimal thrombocytopenia -Continue to monitor.   Medication Adjustments/Labs and Tests Ordered: Current medicines are reviewed at length with the patient today.  Concerns regarding medicines are outlined above.  Orders Placed This Encounter  Procedures  . HOLTER MONITOR - 24 HOUR  . MYOCARDIAL PERFUSION IMAGING  . EKG 12-Lead  . ECHOCARDIOGRAM COMPLETE   No orders of the defined types were placed in this encounter.   Patient Instructions  Medication Instructions:  The current medical regimen is effective;  continue present plan and medications.  If you need a refill on your cardiac medications before your next appointment, please call your pharmacy.   Testing/Procedures: Your physician has requested that you have a lexiscan myoview. For further information please visit [email or web address]. Please follow instruction sheet, as given.  Your physician has recommended that you wear a holter monitor. Holter monitors are medical devices that record the heart's electrical activity. Doctors most often use these monitors to diagnose arrhythmias.  Arrhythmias are problems with the speed or rhythm of the heartbeat. The monitor is a small, portable device. You can wear one while you do your normal daily activities. This is usually used to diagnose what is causing palpitations/syncope (passing out).  Your physician has requested that you have an echocardiogram. Echocardiography is a painless test that uses sound waves to create images of your heart. It provides your doctor with information about the size and shape of your heart and how well your heart's chambers and valves are working. This procedure takes approximately one hour. There are no restrictions for this procedure.  Follow-Up: At [facility], you and your health needs are our priority.  As part of our continuing mission to provide you with exceptional heart care, we have created designated Provider Care Teams.  These Care Teams include your primary Cardiologist (physician) and Advanced Practice Providers (APPs -  Physician Assistants and Nurse Practitioners) who all work together to provide you with the care you need, when you need it. You will need a follow up appointment in 12 months.  Please call our office 2 months in advance to schedule this appointment.  You may see Ralph Furbish, MD or one of the following Advanced Practice Providers on your designated Care Team:   Truitt Merle, NP Cecilie Kicks, NP . Kathyrn Drown, NP       Signed, Ralph Furbish, MD  10/19/2018 10:30 AM    [place]

## 2018-10-19 NOTE — Patient Instructions (Addendum)
Medication Instructions:  The current medical regimen is effective;  continue present plan and medications.  If you need a refill on your cardiac medications before your next appointment, please call your pharmacy.   Testing/Procedures: Your physician has requested that you have a lexiscan myoview. For further information please visit [email or web address]. Please follow instruction sheet, as given.  Your physician has recommended that you wear a holter monitor. Holter monitors are medical devices that record the heart's electrical activity. Doctors most often use these monitors to diagnose arrhythmias. Arrhythmias are problems with the speed or rhythm of the heartbeat. The monitor is a small, portable device. You can wear one while you do your normal daily activities. This is usually used to diagnose what is causing palpitations/syncope (passing out).  Your physician has requested that you have an echocardiogram. Echocardiography is a painless test that uses sound waves to create images of your heart. It provides your doctor with information about the size and shape of your heart and how well your heart's chambers and valves are working. This procedure takes approximately one hour. There are no restrictions for this procedure.  Follow-Up: At [facility], you and your health needs are our priority.  As part of our continuing mission to provide you with exceptional heart care, we have created designated Provider Care Teams.  These Care Teams include your primary Cardiologist (physician) and Advanced Practice Providers (APPs -  Physician Assistants and Nurse Practitioners) who all work together to provide you with the care you need, when you need it. You will need a follow up appointment in 12 months.  Please call our office 2 months in advance to schedule this appointment.  You may see Candee Furbish, MD or one of the following Advanced Practice Providers on your designated Care Team:   Truitt Merle,  NP Cecilie Kicks, NP . Kathyrn Drown, NP

## 2018-10-20 ENCOUNTER — Other Ambulatory Visit: Payer: Self-pay | Admitting: Internal Medicine

## 2018-10-26 ENCOUNTER — Telehealth: Payer: Self-pay | Admitting: *Deleted

## 2018-10-26 NOTE — Telephone Encounter (Signed)
3 day ZIO XT long term holter monitor to be mailed to your home.  Dr. Marlou Porch would like you to wear the monitor at least 24 hours.  Please schedule around your 11/01/18 Echocardiogram appointment.  This is a one patch system.  You will not be able to remove and replace the patch for another test. Instructions reviewed briefly as they are included in the monitor kit.

## 2018-10-30 ENCOUNTER — Telehealth (HOSPITAL_COMMUNITY): Payer: Self-pay | Admitting: *Deleted

## 2018-10-30 ENCOUNTER — Other Ambulatory Visit (INDEPENDENT_AMBULATORY_CARE_PROVIDER_SITE_OTHER): Payer: Medicare Other

## 2018-10-30 DIAGNOSIS — R001 Bradycardia, unspecified: Secondary | ICD-10-CM

## 2018-10-30 NOTE — Telephone Encounter (Signed)
Patient given detailed instructions per Myocardial Perfusion Study Information Sheet for the test on 11/01/18 . Patient notified to arrive 15 minutes early and that it is imperative to arrive on time for appointment to keep from having the test rescheduled.  If you need to cancel or reschedule your appointment, please call the office within 24 hours of your appointment. . Patient verbalized understanding. Ralph Bell

## 2018-11-01 ENCOUNTER — Ambulatory Visit (HOSPITAL_BASED_OUTPATIENT_CLINIC_OR_DEPARTMENT_OTHER): Payer: Medicare Other

## 2018-11-01 ENCOUNTER — Other Ambulatory Visit: Payer: Self-pay

## 2018-11-01 ENCOUNTER — Ambulatory Visit (HOSPITAL_COMMUNITY): Payer: Medicare Other | Attending: Cardiovascular Disease

## 2018-11-01 DIAGNOSIS — R001 Bradycardia, unspecified: Secondary | ICD-10-CM | POA: Diagnosis not present

## 2018-11-01 DIAGNOSIS — R0602 Shortness of breath: Secondary | ICD-10-CM

## 2018-11-01 DIAGNOSIS — I2583 Coronary atherosclerosis due to lipid rich plaque: Secondary | ICD-10-CM

## 2018-11-01 DIAGNOSIS — I2510 Atherosclerotic heart disease of native coronary artery without angina pectoris: Secondary | ICD-10-CM | POA: Diagnosis not present

## 2018-11-01 LAB — ECHOCARDIOGRAM COMPLETE
Height: 67 in
Weight: 3056 [oz_av]

## 2018-11-01 LAB — MYOCARDIAL PERFUSION IMAGING
LV dias vol: 92 mL (ref 62–150)
LV sys vol: 30 mL
Peak HR: 88 {beats}/min
Rest HR: 47 {beats}/min
SDS: 2
SRS: 0
SSS: 2
TID: 0.86

## 2018-11-01 MED ORDER — REGADENOSON 0.4 MG/5ML IV SOLN
0.4000 mg | Freq: Once | INTRAVENOUS | Status: AC
  Administered 2018-11-01: 0.4 mg via INTRAVENOUS

## 2018-11-01 MED ORDER — TECHNETIUM TC 99M TETROFOSMIN IV KIT
10.1000 | PACK | Freq: Once | INTRAVENOUS | Status: AC | PRN
  Administered 2018-11-01: 10.1 via INTRAVENOUS
  Filled 2018-11-01: qty 11

## 2018-11-01 MED ORDER — TECHNETIUM TC 99M TETROFOSMIN IV KIT
31.5000 | PACK | Freq: Once | INTRAVENOUS | Status: AC | PRN
  Administered 2018-11-01: 31.5 via INTRAVENOUS
  Filled 2018-11-01: qty 32

## 2018-11-09 ENCOUNTER — Other Ambulatory Visit: Payer: Self-pay | Admitting: Cardiology

## 2018-11-09 DIAGNOSIS — R001 Bradycardia, unspecified: Secondary | ICD-10-CM

## 2018-11-14 ENCOUNTER — Telehealth: Payer: Self-pay | Admitting: Nurse Practitioner

## 2018-11-14 DIAGNOSIS — I4589 Other specified conduction disorders: Secondary | ICD-10-CM

## 2018-11-14 DIAGNOSIS — R001 Bradycardia, unspecified: Secondary | ICD-10-CM

## 2018-11-14 NOTE — Telephone Encounter (Signed)
Reviewed monitor results and plan of care with patient. He agrees to proceed with referral to EP. I answered questions to his satisfaction and he is aware that one of our schedulers will call to schedule his appointment with EP. He thanked me for the call.

## 2018-11-14 NOTE — Telephone Encounter (Signed)
-----   Message from Jerline Pain, MD sent at 11/14/2018  2:34 PM EDT -----  Significant bradycardia noted especially in the early morning hours, 30 bpm / 39 bpm at 7-8 AM.  Occasional junctional bradycardic rhythm with retrograde P wave activity  No atrial fibrillation, no significant pauses.  Average heart rate 43 bpm   Significant bradycardia noted.  There seems to be potential blunted affect during exercise.  Chronotropic incompetence.  I would like for him to see electrophysiology for consultation. Candee Furbish, MD

## 2018-12-04 ENCOUNTER — Ambulatory Visit: Payer: Medicare Other | Admitting: Cardiology

## 2018-12-05 ENCOUNTER — Encounter: Payer: Self-pay | Admitting: Internal Medicine

## 2018-12-05 ENCOUNTER — Other Ambulatory Visit: Payer: Self-pay

## 2018-12-05 ENCOUNTER — Encounter

## 2018-12-05 ENCOUNTER — Ambulatory Visit (INDEPENDENT_AMBULATORY_CARE_PROVIDER_SITE_OTHER): Payer: Medicare Other | Admitting: Internal Medicine

## 2018-12-05 DIAGNOSIS — R001 Bradycardia, unspecified: Secondary | ICD-10-CM

## 2018-12-05 NOTE — Progress Notes (Signed)
HPI [name] is referred today by Dr. Marlou Porch for evaluation of sinus node dysfunction. He has a h/o remote tobacco abuse, renal insufficiency, obesity, who has had occaisional episodes of dizziness with no frank syncope. He has had some fatigue with exertion. He wore a cardiac monitor with minimal symptoms but which showed sinus bradycardia in the 40's in the morning and evening. He describes an episode of lightheadedness. His cardiac eval to date demonstrated a lexiscan myoview which demonstrated no ischemia and preserved LV function and a cardiac monitor which demonstrated HR's from 30 to 112 with an average HR in the mid 40's. He walked in the office with me today and his HR reached the high 70's with modest walking. He was unlimited and did not feel dizziness, fatigue or weakness.  Allergies  Allergen Reactions  . Sulfacetamide Sodium      Current Outpatient Medications  Medication Sig Dispense Refill  . allopurinol (ZYLOPRIM) 100 MG tablet TAKE 1 TABLET BY MOUTH  DAILY 90 tablet 0  . amLODipine (NORVASC) 2.5 MG tablet TAKE 1 TABLET BY MOUTH  DAILY 90 tablet 3  . aspirin 81 MG tablet Take 81 mg by mouth daily.      [name] Kitchen atorvastatin (LIPITOR) 40 MG tablet TAKE 1 TABLET BY MOUTH  DAILY 90 tablet 3  . doxazosin (CARDURA) 8 MG tablet TAKE 1 TABLET BY MOUTH AT  BEDTIME 90 tablet 0  . fexofenadine (ALLEGRA) 180 MG tablet Take 1 tablet (180 mg total) by mouth daily. 90 tablet 3  . glucose blood (ACCU-CHEK AVIVA) test strip Use daily as directed 100 each 6  . olmesartan (BENICAR) 40 MG tablet Take 40 mg by mouth daily.     No current facility-administered medications for this visit.      Past Medical History:  Diagnosis Date  . ALLERGIC RHINITIS 11/09/2006  . ASTHMA 11/16/2006  . DIABETES MELLITUS, TYPE II 11/09/2006  . DIVERTICULITIS, HX OF 11/16/2006  . ED (erectile dysfunction)   . Family hx colonic polyps   . History of kidney stones   . HYPERLIPIDEMIA 11/09/2006  . HYPERTENSION  11/09/2006  . ISCHEMIA 08/20/2008  . Rosacea   . Shortness of breath 09/05/2008    ROS:   All systems reviewed and negative except as noted in the HPI.   No past surgical history on file.   No family history on file.   Social History   Socioeconomic History  . Marital status: Married    Spouse name: Not on file  . Number of children: Not on file  . Years of education: Not on file  . Highest education level: Not on file  Occupational History  . Not on file  Social Needs  . Financial resource strain: Not on file  . Food insecurity    Worry: Not on file    Inability: Not on file  . Transportation needs    Medical: Not on file    Non-medical: Not on file  Tobacco Use  . Smoking status: Former Smoker    Quit date: 04/05/1973    Years since quitting: 45.6  . Smokeless tobacco: Former Systems developer    Quit date: 04/05/1978  Substance and Sexual Activity  . Alcohol use: Yes  . Drug use: No  . Sexual activity: Not on file  Lifestyle  . Physical activity    Days per week: Not on file    Minutes per session: Not on file  . Stress: Not on file  Relationships  .  Social Herbalist on phone: Not on file    Gets together: Not on file    Attends religious service: Not on file    Active member of club or organization: Not on file    Attends meetings of clubs or organizations: Not on file    Relationship status: Not on file  . Intimate partner violence    Fear of current or ex partner: Not on file    Emotionally abused: Not on file    Physically abused: Not on file    Forced sexual activity: Not on file  Other Topics Concern  . Not on file  Social History Narrative  . Not on file     BP 122/66   Pulse 63   Ht 5\' 7"  (1.702 m)   Wt 191 lb (86.6 kg)   SpO2 98%   BMI 29.91 kg/m   Physical Exam:  Well appearing NAD HEENT: Unremarkable Neck:  No JVD, no thyromegally Lymphatics:  No adenopathy Back:  No CVA tenderness Lungs:  Clear with no wheezes HEART:  Regular  rate rhythm, no murmurs, no rubs, no clicks Abd:  soft, positive bowel sounds, no organomegally, no rebound, no guarding Ext:  2 plus pulses, no edema, no cyanosis, no clubbing Skin:  No rashes no nodules Neuro:  CN II through XII intact, motor grossly intact  EKG - NSR at 63/min  Assess/Plan: 1. Sinus bradycardia - the patient has some evidence of sinus node dysfunction and has a slow average HR. I discussed the treatment options. He does not have an absolute indication for PPM. He is minimally symptomatic. We discussed the signs that is sinus node dysfunction is worsening. For now we will hold off on proceeding with PPM though I suspect he will ultimately become more symptomatic and when he does place a DDD PPM.   2. Dyslipidemia - he will continue his statin therapy.   3. HTN -  His bp is controlled on medical therapy. We will follow.  Mikle Bosworth.D.

## 2018-12-05 NOTE — Patient Instructions (Addendum)
Medication Instructions:  Your physician recommends that you continue on your current medications as directed. Please refer to the Current Medication list given to you today.  Labwork: None ordered.  Testing/Procedures: None ordered.  Follow-Up: Your physician wants you to follow-up in: one year with Dr. Taylor.  Any Other Special Instructions Will Be Listed Below (If Applicable).  If you need a refill on your cardiac medications before your next appointment, please call your pharmacy.      

## 2019-04-06 ENCOUNTER — Emergency Department (HOSPITAL_COMMUNITY)
Admission: EM | Admit: 2019-04-06 | Discharge: 2019-04-06 | Disposition: A | Discharge status: Home / Self Care | Payer: Medicare Other | Attending: Emergency Medicine | Admitting: Emergency Medicine

## 2019-04-06 ENCOUNTER — Encounter (HOSPITAL_COMMUNITY): Payer: Self-pay | Admitting: Emergency Medicine

## 2019-04-06 ENCOUNTER — Telehealth: Payer: Self-pay | Admitting: Physician Assistant

## 2019-04-06 ENCOUNTER — Emergency Department (HOSPITAL_COMMUNITY): Payer: Medicare Other

## 2019-04-06 DIAGNOSIS — Z7982 Long term (current) use of aspirin: Secondary | ICD-10-CM | POA: Diagnosis not present

## 2019-04-06 DIAGNOSIS — I484 Atypical atrial flutter: Secondary | ICD-10-CM | POA: Diagnosis not present

## 2019-04-06 DIAGNOSIS — R140 Abdominal distension (gaseous): Secondary | ICD-10-CM | POA: Insufficient documentation

## 2019-04-06 DIAGNOSIS — R002 Palpitations: Secondary | ICD-10-CM | POA: Diagnosis present

## 2019-04-06 DIAGNOSIS — I2510 Atherosclerotic heart disease of native coronary artery without angina pectoris: Secondary | ICD-10-CM | POA: Diagnosis not present

## 2019-04-06 DIAGNOSIS — R10816 Epigastric abdominal tenderness: Secondary | ICD-10-CM | POA: Diagnosis not present

## 2019-04-06 DIAGNOSIS — Z7901 Long term (current) use of anticoagulants: Secondary | ICD-10-CM | POA: Insufficient documentation

## 2019-04-06 DIAGNOSIS — I4891 Unspecified atrial fibrillation: Secondary | ICD-10-CM

## 2019-04-06 DIAGNOSIS — I495 Sick sinus syndrome: Secondary | ICD-10-CM

## 2019-04-06 DIAGNOSIS — I4892 Unspecified atrial flutter: Secondary | ICD-10-CM

## 2019-04-06 DIAGNOSIS — I10 Essential (primary) hypertension: Secondary | ICD-10-CM | POA: Diagnosis not present

## 2019-04-06 DIAGNOSIS — Z87891 Personal history of nicotine dependence: Secondary | ICD-10-CM | POA: Diagnosis not present

## 2019-04-06 DIAGNOSIS — E119 Type 2 diabetes mellitus without complications: Secondary | ICD-10-CM | POA: Insufficient documentation

## 2019-04-06 DIAGNOSIS — J45909 Unspecified asthma, uncomplicated: Secondary | ICD-10-CM | POA: Diagnosis not present

## 2019-04-06 DIAGNOSIS — R0789 Other chest pain: Secondary | ICD-10-CM | POA: Diagnosis not present

## 2019-04-06 DIAGNOSIS — R0602 Shortness of breath: Secondary | ICD-10-CM | POA: Insufficient documentation

## 2019-04-06 HISTORY — DX: Atherosclerotic heart disease of native coronary artery without angina pectoris: I25.10

## 2019-04-06 LAB — HEPATIC FUNCTION PANEL
ALT: 38 U/L (ref 0–44)
AST: 31 U/L (ref 15–41)
Albumin: 3.8 g/dL (ref 3.5–5.0)
Alkaline Phosphatase: 80 U/L (ref 38–126)
Bilirubin, Direct: 0.1 mg/dL (ref 0.0–0.2)
Indirect Bilirubin: 0.9 mg/dL (ref 0.3–0.9)
Total Bilirubin: 1 mg/dL (ref 0.3–1.2)
Total Protein: 7.2 g/dL (ref 6.5–8.1)

## 2019-04-06 LAB — BASIC METABOLIC PANEL
Anion gap: 13 (ref 5–15)
BUN: 36 mg/dL — ABNORMAL HIGH (ref 8–23)
CO2: 19 mmol/L — ABNORMAL LOW (ref 22–32)
Calcium: 9 mg/dL (ref 8.9–10.3)
Chloride: 107 mmol/L (ref 98–111)
Creatinine, Ser: 1.87 mg/dL — ABNORMAL HIGH (ref 0.61–1.24)
GFR calc Af Amer: 39 mL/min — ABNORMAL LOW (ref >60)
GFR calc non Af Amer: 34 mL/min — ABNORMAL LOW (ref >60)
Glucose, Bld: 168 mg/dL — ABNORMAL HIGH (ref 70–99)
Potassium: 4.7 mmol/L (ref 3.5–5.1)
Sodium: 139 mmol/L (ref 135–145)

## 2019-04-06 LAB — CBC
HCT: 43.3 % (ref 39.0–52.0)
Hemoglobin: 14.2 g/dL (ref 13.0–17.0)
MCH: 29.6 pg (ref 26.0–34.0)
MCHC: 32.8 g/dL (ref 30.0–36.0)
MCV: 90.4 fL (ref 80.0–100.0)
Platelets: 177 10*3/uL (ref 150–400)
RBC: 4.79 MIL/uL (ref 4.22–5.81)
RDW: 13 % (ref 11.5–15.5)
WBC: 9 10*3/uL (ref 4.0–10.5)
nRBC: 0 % (ref 0.0–0.2)

## 2019-04-06 LAB — TSH: TSH: 1.143 u[IU]/mL (ref 0.350–4.500)

## 2019-04-06 LAB — TROPONIN I (HIGH SENSITIVITY)
Troponin I (High Sensitivity): 26 ng/L — ABNORMAL HIGH (ref <18)
Troponin I (High Sensitivity): 35 ng/L — ABNORMAL HIGH (ref <18)

## 2019-04-06 LAB — MAGNESIUM: Magnesium: 1.4 mg/dL — ABNORMAL LOW (ref 1.7–2.4)

## 2019-04-06 MED ORDER — MAGNESIUM SULFATE 2 GM/50ML IV SOLN
2.0000 g | Freq: Once | INTRAVENOUS | Status: AC
  Administered 2019-04-06: 2 g via INTRAVENOUS
  Filled 2019-04-06: qty 50

## 2019-04-06 MED ORDER — APIXABAN 5 MG PO TABS: 5.0000 mg | ORAL_TABLET | Freq: Two times a day (BID) | ORAL | 0 refills | Status: DC

## 2019-04-06 MED ORDER — SODIUM CHLORIDE 0.9 % IV BOLUS
500.0000 mL | Freq: Once | INTRAVENOUS | Status: AC
  Administered 2019-04-06: 500 mL via INTRAVENOUS

## 2019-04-06 MED ORDER — IOHEXOL 350 MG/ML SOLN
80.0000 mL | Freq: Once | INTRAVENOUS | Status: AC | PRN
  Administered 2019-04-06: 80 mL via INTRAVENOUS

## 2019-04-06 MED ORDER — APIXABAN 5 MG PO TABS
5.0000 mg | ORAL_TABLET | Freq: Two times a day (BID) | ORAL | Status: DC
  Administered 2019-04-06: 5 mg via ORAL
  Filled 2019-04-06: qty 1

## 2019-04-06 MED ORDER — RIVAROXABAN (XARELTO) EDUCATION KIT FOR AFIB PATIENTS: PACK | Freq: Once | Status: DC

## 2019-04-06 MED ORDER — SODIUM CHLORIDE 0.9% FLUSH: 3.0000 mL | Freq: Once | INTRAVENOUS | Status: DC

## 2019-04-06 NOTE — Consult Note (Addendum)
Cardiology Consult    Patient ID: Ralph Bell MRN: [id number], DOB/AGE: [birth year]   Consult date: 04/06/2019  Primary Care Provider: Marton Redwood, MD Primary Cardiologist: Candee Furbish, MD  Primary Electrophysiologist: Dr. Lovena Le  Chief Complaint: Palpitations  Patient Profile    Ralph Bell is a 79 y.o. male with past medical history of with past medical history of CAD (calcification by CT imaging in 2010, low-risk NST in 10/2018), HTN, HLD and Stage  3-4 CKD who is being evaluated today for new-onset atrial flutter at the request of Sophia Caccavale, PA-C.   History of Present Illness    Ralph Bell was last examined by Dr. Lovena Le in 12/2018 after his recent event monitor showed significant bradycardia with HR in the 30's and average HR was 43 bpm. No atrial fibrillation noted at that time and no significant pauses. Treatment options were reviewed and watchful waiting was pursued with consideration of PPM placement if he became more symptomatic.   He called the after hours line this AM reporting elevated HR, fatigue and chest pressure, therefore he was advised to come to the ED for further evaluation.   In talking with the patient today, he reports having baseline dyspnea on exertion with routine activities but starting this morning he developed palpitations with an associated chest pressure. Says he felt like his heart was racing and his HR is normally in the 30's to 40's in the AM hours but was variable from the 50's to 110's. He denies any associated dizziness or presyncope. Says he did have associated fatigue.   He denies any pain or palpitations at this time.   Initial labs show WBC 9.0, Hgb 14.2, platelets 177, Na+ 139, K+ 4.7 and creatinine 1.87. Initial HS Troponin 26 with repeat of 35. Mg 1.4. CXR with no acute cardiopulmonary abnormalities. CTA showed no evidence of a PE. Was noted to have aortic atherosclerosis and coronary calcifications.  EKG shows atrial  flutter, HR 99 with variable block. He has been followed on telemetry and remains in atrial flutter with HR in the 70's to 90's. No significant pauses.      Past Medical History:  Diagnosis Date  . ALLERGIC RHINITIS 11/09/2006  . ASTHMA 11/16/2006  . CAD (coronary artery disease)    a. calcification by CT imaging in 2010 b.  low-risk NST in 10/2018  . DIVERTICULITIS, HX OF 11/16/2006  . ED (erectile dysfunction)   . Family hx colonic polyps   . History of kidney stones   . HYPERLIPIDEMIA 11/09/2006  . HYPERTENSION 11/09/2006  . Rosacea   . Shortness of breath 09/05/2008    History reviewed. No pertinent surgical history.   Medications Prior to Admission: Prior to Admission medications   Medication Sig Start Date End Date Taking? Authorizing Provider  allopurinol (ZYLOPRIM) 100 MG tablet TAKE 1 TABLET BY MOUTH  DAILY 12/19/17   Marletta Lor, MD  amLODipine (NORVASC) 2.5 MG tablet TAKE 1 TABLET BY MOUTH  DAILY 09/05/17   Marletta Lor, MD  aspirin 81 MG tablet Take 81 mg by mouth daily.      [provider]  atorvastatin (LIPITOR) 40 MG tablet TAKE 1 TABLET BY MOUTH  DAILY 10/31/17   Marletta Lor, MD  doxazosin (CARDURA) 8 MG tablet TAKE 1 TABLET BY MOUTH AT  BEDTIME 12/19/17   Marletta Lor, MD  fexofenadine (ALLEGRA) 180 MG tablet Take 1 tablet (180 mg total) by mouth daily. 03/05/16   Bluford Kaufmann  F, MD  glucose blood (ACCU-CHEK AVIVA) test strip Use daily as directed 11/24/11   Marletta Lor, MD  olmesartan (BENICAR) 40 MG tablet Take 40 mg by mouth daily. 09/14/18   [provider]     Allergies:    Allergies  Allergen Reactions  . Sulfacetamide Sodium     Social History:   Social History   Socioeconomic History  . Marital status: Married    Spouse name: Not on file  . Number of children: Not on file  . Years of education: Not on file  . Highest education level: Not on file  Occupational History  . Not on file  Tobacco  Use  . Smoking status: Former Smoker    Quit date: 04/05/1973    Years since quitting: 46.0  . Smokeless tobacco: Former Systems developer    Quit date: 04/05/1978  Substance and Sexual Activity  . Alcohol use: Yes  . Drug use: No  . Sexual activity: Not on file  Other Topics Concern  . Not on file  Social History Narrative  . Not on file   Social Determinants of Health   Financial Resource Strain:   . Difficulty of Paying Living Expenses: Not on file  Food Insecurity:   . Worried About Charity fundraiser in the Last Year: Not on file  . Ran Out of Food in the Last Year: Not on file  Transportation Needs:   . Lack of Transportation (Medical): Not on file  . Lack of Transportation (Non-Medical): Not on file  Physical Activity:   . Days of Exercise per Week: Not on file  . Minutes of Exercise per Session: Not on file  Stress:   . Feeling of Stress : Not on file  Social Connections:   . Frequency of Communication with Friends and Family: Not on file  . Frequency of Social Gatherings with Friends and Family: Not on file  . Attends Religious Services: Not on file  . Active Member of Clubs or Organizations: Not on file  . Attends Archivist Meetings: Not on file  . Marital Status: Not on file  Intimate Partner Violence:   . Fear of Current or Ex-Partner: Not on file  . Emotionally Abused: Not on file  . Physically Abused: Not on file  . Sexually Abused: Not on file      Family History:   The patient's family history includes Hypertension in his father.     Review of Systems    General:  No chills, fever, night sweats or weight changes.  Cardiovascular:  No dyspnea on exertion, edema, orthopnea, paroxysmal nocturnal dyspnea. Positive for palpitations and chest pressure.  Dermatological: No rash, lesions/masses Respiratory: No cough, dyspnea Urologic: No hematuria, dysuria Abdominal:   No nausea, vomiting, diarrhea, bright red blood per rectum, melena, or  hematemesis Neurologic:  No visual changes, wkns, changes in mental status. All other systems reviewed and are otherwise negative except as noted above.  Physical Exam    Vitals:   04/06/19 1230 04/06/19 1443 04/06/19 1651 04/06/19 1715  BP:  (!) 158/92 (!) 182/85 (!) 156/89  Pulse:  80 63 80  Resp:  16 14 20   Temp:      TempSrc:      SpO2:  99% 99% 97%  Weight: 88.5 kg     Height: 5\' 7"  (1.702 m)      No intake or output data in the 24 hours ending 04/06/19 1751 Filed Weights   04/06/19 1230  Weight: 88.5 kg   Body mass index is 30.54 kg/m.   General: Well developed, well nourished,male appearing in no acute distress. Head: Normocephalic, atraumatic, sclera non-icteric, no xanthomas, nares are without discharge. Dentition:  Neck: No carotid bruits. JVD not elevated.  Lungs: Respirations regular and unlabored, without wheezes or rales.  Heart: Irregularly irregular. No S3 or S4.  No murmur, no rubs, or gallops appreciated. Abdomen: Soft, non-tender, non-distended with normoactive bowel sounds. No hepatomegaly. No rebound/guarding. No obvious abdominal masses. Msk:  Strength and tone appear normal for age. No joint deformities or effusions. Extremities: No clubbing or cyanosis. No lower extremity edema.  Distal pedal pulses are 2+ bilaterally. Neuro: Alert and oriented X 3. Moves all extremities spontaneously. No focal deficits noted. Psych:  Responds to questions appropriately with a normal affect. Skin: No rashes or lesions noted  Labs and Radiology Studies    EKG:  The ECG that was done  was personally reviewed and demonstrates Atrial Flutter, HR 99 with variable block. No acute ST abnormalities.   Relevant CV Studies:  NST: 10/2018  Nuclear stress EF: 67%.  There was no ST segment deviation noted during stress.  The study is normal.  This is a low risk study.  The left ventricular ejection fraction is hyperdynamic (>65%).   Normal resting and stress  perfusion. No ischemia or infarction EF 67%  Event Monitor: 10/2018  Significant bradycardia noted especially in the early morning hours, 30 bpm / 39 bpm at 7-8 AM.  Occasional junctional bradycardic rhythm with retrograde P wave activity  No atrial fibrillation, no significant pauses.  Average heart rate 43 bpm   Significant bradycardia noted.  There seems to be potential blunted affect during exercise.  Chronotropic incompetence.  I would like for him to see electrophysiology for consultation. Candee Furbish, MD  Laboratory Data:  Chemistry Recent Labs  Lab 04/06/19 1118  NA 139  K 4.7  CL 107  CO2 19*  GLUCOSE 168*  BUN 36*  CREATININE 1.87*  CALCIUM 9.0  GFRNONAA 34*  GFRAA 39*  ANIONGAP 13    Recent Labs  Lab 04/06/19 1212  PROT 7.2  ALBUMIN 3.8  AST 31  ALT 38  ALKPHOS 80  BILITOT 1.0   Hematology Recent Labs  Lab 04/06/19 1118  WBC 9.0  RBC 4.79  HGB 14.2  HCT 43.3  MCV 90.4  MCH 29.6  MCHC 32.8  RDW 13.0  PLT 177   Cardiac EnzymesNo results for input(s): TROPONINI in the last 168 hours. No results for input(s): TROPIPOC in the last 168 hours.  BNPNo results for input(s): BNP, PROBNP in the last 168 hours.  DDimer No results for input(s): DDIMER in the last 168 hours.  Radiology/Studies:  DG Chest 2 View  Result Date: 04/06/2019 CLINICAL DATA:  Chest pain EXAM: CHEST - 2 VIEW COMPARISON:  None. FINDINGS: The heart size and mediastinal contours are within normal limits. Both lungs are clear. The visualized skeletal structures are unremarkable. IMPRESSION: No active cardiopulmonary disease. Electronically Signed   By: Inez Catalina M.D.   On: 04/06/2019 12:09   CT Angio Chest PE W and/or Wo Contrast  Result Date: 04/06/2019 CLINICAL DATA:  Abdominal distention. Evaluate for mass. Shortness of breath. EXAM: CT ANGIOGRAPHY CHEST CT ABDOMEN AND PELVIS WITH CONTRAST TECHNIQUE: Multidetector CT imaging of the chest was performed using the standard  protocol during bolus administration of intravenous contrast. Multiplanar CT image reconstructions and MIPs were obtained to evaluate the vascular anatomy. Multidetector CT  imaging of the abdomen and pelvis was performed using the standard protocol during bolus administration of intravenous contrast. CONTRAST:  61mL OMNIPAQUE IOHEXOL 350 MG/ML SOLN COMPARISON:  None. FINDINGS: CTA CHEST FINDINGS Cardiovascular: Heart size appears within normal limits. There is no pericardial effusion identified. Aortic atherosclerosis. Lad and RCA coronary artery calcifications. The main pulmonary artery appears patent. No central obstructing pulmonary emboli identified. No lobar or segmental pulmonary artery filling defects identified bilaterally. Mediastinum/Nodes: Right lobe of thyroid gland nodule is identified measuring 2.3 cm, image 35/7. The trachea appears patent and is midline. Normal appearance of the esophagus. No mediastinal or hilar adenopathy. Lungs/Pleura: No pleural effusion identified. No suspicious pulmonary nodule or mass identified. Musculoskeletal: Spondylosis identified. No aggressive lytic or sclerotic bone lesions. Review of the MIP images confirms the above findings. CT ABDOMEN and PELVIS FINDINGS Hepatobiliary: No focal liver abnormality is seen. No gallstones, gallbladder wall thickening, or biliary dilatation. Pancreas: Unremarkable. No pancreatic ductal dilatation or surrounding inflammatory changes. Spleen: Normal in size without focal abnormality. Adrenals/Urinary Tract: Normal appearance of the adrenal glands. 2.1 cm right kidney cyst. Bilateral perinephric fat stranding is noted, nonspecific. This may be related to prior insult. No mass or hydronephrosis identified at this time. Urinary bladder appears unremarkable. Stomach/Bowel: Stomach is within normal limits. Appendix appears normal. No evidence of bowel wall thickening, distention, or inflammatory changes. Vascular/Lymphatic: Aortic  atherosclerosis. No enlarged abdominopelvic lymph nodes. Reproductive: Mild prostate gland enlargement. Other: No free fluid or fluid collections. Musculoskeletal: Spondylosis identified scratch set mild lumbar spondylosis. No acute or suspicious osseous abnormality. Review of the MIP images confirms the above findings. IMPRESSION: 1. No evidence for acute pulmonary embolus. 2. No acute findings identified within the abdomen or pelvis. 3. Aortic atherosclerosis. Coronary artery calcifications noted. 4. Right lobe of thyroid gland nodule measures 2.3 cm. Consider further evaluation with thyroid ultrasound. If patient is clinically hyperthyroid, consider nuclear medicine thyroid uptake and scan. Aortic Atherosclerosis (ICD10-I70.0). Electronically Signed   By: Kerby Moors M.D.   On: 04/06/2019 14:16   CT ABDOMEN PELVIS W CONTRAST  Result Date: 04/06/2019 CLINICAL DATA:  Abdominal distention. Evaluate for mass. Shortness of breath. EXAM: CT ANGIOGRAPHY CHEST CT ABDOMEN AND PELVIS WITH CONTRAST TECHNIQUE: Multidetector CT imaging of the chest was performed using the standard protocol during bolus administration of intravenous contrast. Multiplanar CT image reconstructions and MIPs were obtained to evaluate the vascular anatomy. Multidetector CT imaging of the abdomen and pelvis was performed using the standard protocol during bolus administration of intravenous contrast. CONTRAST:  47mL OMNIPAQUE IOHEXOL 350 MG/ML SOLN COMPARISON:  None. FINDINGS: CTA CHEST FINDINGS Cardiovascular: Heart size appears within normal limits. There is no pericardial effusion identified. Aortic atherosclerosis. Lad and RCA coronary artery calcifications. The main pulmonary artery appears patent. No central obstructing pulmonary emboli identified. No lobar or segmental pulmonary artery filling defects identified bilaterally. Mediastinum/Nodes: Right lobe of thyroid gland nodule is identified measuring 2.3 cm, image 35/7. The trachea  appears patent and is midline. Normal appearance of the esophagus. No mediastinal or hilar adenopathy. Lungs/Pleura: No pleural effusion identified. No suspicious pulmonary nodule or mass identified. Musculoskeletal: Spondylosis identified. No aggressive lytic or sclerotic bone lesions. Review of the MIP images confirms the above findings. CT ABDOMEN and PELVIS FINDINGS Hepatobiliary: No focal liver abnormality is seen. No gallstones, gallbladder wall thickening, or biliary dilatation. Pancreas: Unremarkable. No pancreatic ductal dilatation or surrounding inflammatory changes. Spleen: Normal in size without focal abnormality. Adrenals/Urinary Tract: Normal appearance of the adrenal glands. 2.1 cm right  kidney cyst. Bilateral perinephric fat stranding is noted, nonspecific. This may be related to prior insult. No mass or hydronephrosis identified at this time. Urinary bladder appears unremarkable. Stomach/Bowel: Stomach is within normal limits. Appendix appears normal. No evidence of bowel wall thickening, distention, or inflammatory changes. Vascular/Lymphatic: Aortic atherosclerosis. No enlarged abdominopelvic lymph nodes. Reproductive: Mild prostate gland enlargement. Other: No free fluid or fluid collections. Musculoskeletal: Spondylosis identified scratch set mild lumbar spondylosis. No acute or suspicious osseous abnormality. Review of the MIP images confirms the above findings. IMPRESSION: 1. No evidence for acute pulmonary embolus. 2. No acute findings identified within the abdomen or pelvis. 3. Aortic atherosclerosis. Coronary artery calcifications noted. 4. Right lobe of thyroid gland nodule measures 2.3 cm. Consider further evaluation with thyroid ultrasound. If patient is clinically hyperthyroid, consider nuclear medicine thyroid uptake and scan. Aortic Atherosclerosis (ICD10-I70.0). Electronically Signed   By: Kerby Moors M.D.   On: 04/06/2019 14:16    Assessment and Plan:   1. New-Onset Atrial  Flutter - developed new-onset palpitations with associated chest pressure this AM and symptoms have now resolved. EKG is consistent with new-onset atrial flutter and rates have overall been well-controlled in the 70's to 90's by review of telemetry. Mg low at 1.4 and currently being replaced. K+ 4.7. Will check TSH.  - Situation complicated by event monitor in 11/2018 showing significant bradycardia with HR in the 30's at times and average HR of 43 bpm. Discussed with Dr. Domenic Polite and will plan to discharge without an AV nodal blocking agent given concern for significant bradycardia if he were to convert back to NSR spontaneously. Will send a staff message to arrange close follow-up with the [facility] for if he remains symptomatic, would consider DCCV following 3 weeks of anticoagulation. Pending response, may need to further discuss PPM placement as mentioned in prior consult by Dr. Lovena Le.  - This patients CHA2DS2-VASc Score and unadjusted Ischemic Stroke Rate (% per year) is equal to 4.8 % stroke rate/year from a score of 4 (HTN, Vascular, Age (2)). Recommend starting anticoagulation with Eliquis 5mg  BID. Discussed with ED Provider who will send in Rx and arrange for 30-day card prior to discharge.   2. Coronary Calcifications - he has a history of coronary calcifications by CT Imaging with recent NST in 10/2018 being low-risk as outlined above.  - Initial HS Troponin 26 with repeat of 35. EKG shows no acute ST abnormalities.  - stop ASA given the initiation of anticoagulation. Continue statin therapy.   3. HTN - BP has been variable from 101/75 - 182/92 while in the ED, at 156/89 on most recent check. Reports this has been well-controlled when checked at home.  - continue Amlodipine, Cardura and Benicar at the time of discharge.   4. Stage 3-4 CKD - baseline 1.7 - 1.8. At 1.87 while in the ED.   5. Hypomagnesemia - Mg 1.4. Replacement has been ordered by the ED Provider. Would  recommend rechecking Mg as an outpatient.    For questions or updates, please contact [facility] Please consult [email or web address] for contact info under Cardiology/STEMI.   Signed, Erma Heritage, PA-C 04/06/2019, 5:51 PM Pager: [phone]   Attending note:  Patient seen and examined.  I reviewed his records and discussed the case with Strader PA-C.  Mr. Kleinman presents to the ER today complaining of recent onset shortness of breath and chest discomfort with concurrent findings of elevated heart rate over his baseline, fluctuations from the 50s to the low  110 range which is very unusual for him.  He does not report any falls or syncope.  He has been fatigued.  On assessment in the emergency room he was found to be in atrial fibrillation which is a new diagnosis.  Recent cardiac work-up has been mainly centered around evaluation of possible symptomatic bradycardia, although following initial assessment by Dr. Lovena Le in September he was not felt to be a clear candidate for pacemaker placement as yet.  He also underwent a low risk Lexiscan Myoview within the last 6 months and LVEF is normal by echocardiogram.  He is not on any AV nodal blockers and in general his heart rate tends to run in the 40s to 50s.  On evaluation in the ER he was in no distress, did not describe any active chest pain or breathlessness, heart rate in the 70s in atrial fibrillation at rest.  Lungs clear without labored breathing.  Cardiac exam with irregularly irregular rhythm and no gallop.  Lab work shows potassium 4.7, BUN 36, creatinine 1.87, magnesium 1.4, high-sensitivity troponin I levels of 26 and 35 (not suggestive of ACS), hemoglobin 14.2.  Chest and abdominal CT did not reveal any acute findings, no pulmonary embolus, incidentally noted aortic and coronary calcifications, right thyroid nodule.  I personally reviewed his ECG which shows coarse atrial fibrillation versus atypical atrial flutter with controlled  rate, nonspecific ST changes.  Situation discussed with the patient.  He does not report any acute symptoms at this time.  Plan is to stop aspirin, initiate Eliquis 5 mg twice daily, and discharge home for follow-up in the atrial fibrillation clinic next week.  I would not start any AV nodal blockers for fear that he would have substantial symptomatic bradycardia were he to convert spontaneously.  The issue will be symptoms in atrial fibrillation/flutter, and he may ultimately need an electrical cardioversion if he does not convert on his own.  This also may intensify consideration for a pacemaker to better allow heart rate control measures as needed.  Satira Sark, M.D., F.A.C.C.

## 2019-04-06 NOTE — Telephone Encounter (Signed)
79 year old male with history of bradycardia.  He was evaluated back in July with an event monitor that demonstrated sinus bradycardia.  Echocardiogram demonstrated normal LV function and Myoview showed normal perfusion.  Called the answering service today with complaints of elevated heart rate.  On his pulse oximeter it has been anywhere from 67-113.  He feels fatigued, has chest pressure and has been belching.  PLAN:  I have advised him to go to the ED for evaluation today.  I advised him to go by ambulance if he is having active chest discomfort or shortness of breath.  He understands these recommendations and agrees with the plan.   Richardson Dopp, PA-C    04/06/2019 10:41 AM

## 2019-04-06 NOTE — ED Triage Notes (Signed)
Pt reports at [place] he suddenly got very hot and began to feel like his heart was racing and then started to have left sided chest pain with belching. Pt states now he just has a mild chest pressure and still feels like his heart is out of rhythm.

## 2019-04-06 NOTE — ED Notes (Signed)
Patient verbalizes understanding of discharge instructions. Opportunity for questioning and answers were provided. Pt discharged from ED. 

## 2019-04-06 NOTE — Discharge Instructions (Signed)
Continue taking her medications as prescribed. Take Eliquis twice a day. Take other anti-inflammatories such as ibuprofen, Advil, Motrin.  Take Tylenol as needed for pain. Follow-up with the cardiologist as instructed. Return to the emergency room if you develop new or worsening chest pain or shortness of breath.  Return with any new, sting, concerning symptoms.

## 2019-04-06 NOTE — ED Notes (Signed)
Cardiology at bedside.

## 2019-04-06 NOTE — ED Notes (Signed)
Patient transported to CT 

## 2019-04-06 NOTE — ED Provider Notes (Signed)
[facility] DEPARTMENT Provider Note   CSN: [id number] Arrival date & time: 04/06/19  1104     History Chief Complaint  Patient presents with  . Chest Pain  . Palpitations    Ralph Bell is a 79 y.o. male presenting for evaluation of chest pain and palpitations.  Patient states around 6 AM this morning he did become very flushed and like his heart was racing.  He developed a pressure on the left side of his chest and started belching.  He checked his heart rate on his portable pulse ox, heart rate was ranging between 50s to 120s.  This is atypical for him, he has a history of bradycardia, no history of A. fib.  He states he is currently having continued mild chest pressure, but otherwise no chest pain.  Patient states he has had shortness of breath with exertion for several months.  This was evaluated by cardiology with Dr. Marlou Porch, he had a negative echo, negative Holter monitor, and myocardial scan.  He denies recent fevers, chills, cough, nausea, vomiting, abdominal pain, urinary symptoms, abnormal bowel movements.  He denies recent travel, surgeries, immobilization, history of cancer, history of previous DVT/PE, or hormone use.  He denies recent change in medications.  HPI     Past Medical History:  Diagnosis Date  . ALLERGIC RHINITIS 11/09/2006  . ASTHMA 11/16/2006  . CAD (coronary artery disease)    a. calcification by CT imaging in 2010 b.  low-risk NST in 10/2018  . DIVERTICULITIS, HX OF 11/16/2006  . ED (erectile dysfunction)   . Family hx colonic polyps   . History of kidney stones   . HYPERLIPIDEMIA 11/09/2006  . HYPERTENSION 11/09/2006  . Rosacea   . Shortness of breath 09/05/2008    Patient Active Problem List   Diagnosis Date Noted  . Bradycardia 12/05/2018  . History of colonic polyps 09/08/2017  . 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 Testosterone deficiency 03/07/2012  . Gout 07/28/2011  . SHORTNESS OF BREATH 09/05/2008  .  ISCHEMIA 08/20/2008  . ASTHMA 11/16/2006  . DIVERTICULITIS, HX OF 11/16/2006  . Diabetes mellitus with renal complications ([place]) 52/84/1324  . Dyslipidemia 11/09/2006  . Essential hypertension 11/09/2006  . ALLERGIC RHINITIS 11/09/2006    History reviewed. No pertinent surgical history.     Family History  Problem Relation Age of Onset  . Hypertension Father     Social History   Tobacco Use  . Smoking status: Former Smoker    Quit date: 04/05/1973    Years since quitting: 46.0  . Smokeless tobacco: Former Systems developer    Quit date: 04/05/1978  Substance Use Topics  . Alcohol use: Yes  . Drug use: No    Home Medications Prior to Admission medications   Medication Sig Start Date End Date Taking? Authorizing Provider  allopurinol (ZYLOPRIM) 100 MG tablet TAKE 1 TABLET BY MOUTH  DAILY 12/19/17   Marletta Lor, MD  amLODipine (NORVASC) 2.5 MG tablet TAKE 1 TABLET BY MOUTH  DAILY 09/05/17   Marletta Lor, MD  apixaban (ELIQUIS) 5 MG TABS tablet Take 1 tablet (5 mg total) by mouth 2 (two) times daily. 04/06/19 05/06/19  Lofton Leon, PA-C  aspirin 81 MG tablet Take 81 mg by mouth daily.      [provider]  atorvastatin (LIPITOR) 40 MG tablet TAKE 1 TABLET BY MOUTH  DAILY 10/31/17   Marletta Lor, MD  doxazosin (CARDURA) 8 MG tablet TAKE 1 TABLET BY MOUTH  AT  BEDTIME 12/19/17   Marletta Lor, MD  fexofenadine (ALLEGRA) 180 MG tablet Take 1 tablet (180 mg total) by mouth daily. 03/05/16   Marletta Lor, MD  glucose blood (ACCU-CHEK AVIVA) test strip Use daily as directed 11/24/11   Marletta Lor, MD  olmesartan (BENICAR) 40 MG tablet Take 40 mg by mouth daily. 09/14/18   [provider]    Allergies    Sulfacetamide sodium  Review of Systems   Review of Systems  Respiratory: Positive for chest tightness and shortness of breath.   Cardiovascular: Positive for palpitations.  All other systems reviewed and are negative.   Physical  Exam Updated Vital Signs BP (!) 165/89   Pulse 62   Temp 97.9 F (36.6 C) (Oral)   Resp 12   Ht 5\' 7"  (1.702 m)   Wt 88.5 kg   SpO2 98%   BMI 30.54 kg/m   Physical Exam Vitals and nursing note reviewed.  Constitutional:      General: He is not in acute distress.    Appearance: He is well-developed.     Comments: Resting comfortably in the bed in no acute distress  HENT:     Head: Normocephalic and atraumatic.  Eyes:     Extraocular Movements: Extraocular movements intact.     Conjunctiva/sclera: Conjunctivae normal.     Pupils: Pupils are equal, round, and reactive to light.  Cardiovascular:     Rate and Rhythm: Tachycardia present. Rhythm irregular.     Pulses: Normal pulses.     Comments: Heart rate between 105 125 on my exam.  Irregular. Pulmonary:     Effort: Pulmonary effort is normal. No respiratory distress.     Breath sounds: Normal breath sounds. No wheezing.     Comments: Speaking in full sentences.  Clear lung sounds in all fields. Abdominal:     General: There is distension.     Palpations: Abdomen is soft. There is no mass.     Tenderness: There is abdominal tenderness. There is no guarding or rebound.     Comments: Question distention versus obese abdomen.  Mildly tender to palpation of the epigastric abdomen.  Abdomen appears more swollen in the left upper quadrant, patient states this is baseline.  No rigidity, guarding, distention.  Musculoskeletal:        General: Normal range of motion.     Cervical back: Normal range of motion and neck supple.     Right lower leg: No edema.     Left lower leg: No edema.  Skin:    General: Skin is warm and dry.     Capillary Refill: Capillary refill takes less than 2 seconds.  Neurological:     Mental Status: He is alert and oriented to person, place, and time.     ED Results / Procedures / Treatments   Labs (all labs ordered are listed, but only abnormal results are displayed) Labs Reviewed  BASIC METABOLIC  PANEL - Abnormal; Notable for the following components:      Result Value   CO2 19 (*)    Glucose, Bld 168 (*)    BUN 36 (*)    Creatinine, Ser 1.87 (*)    GFR calc non Af Amer 34 (*)    GFR calc Af Amer 39 (*)    All other components within normal limits  MAGNESIUM - Abnormal; Notable for the following components:   Magnesium 1.4 (*)    All other components within normal  limits  TROPONIN I (HIGH SENSITIVITY) - Abnormal; Notable for the following components:   Troponin I (High Sensitivity) 26 (*)    All other components within normal limits  TROPONIN I (HIGH SENSITIVITY) - Abnormal; Notable for the following components:   Troponin I (High Sensitivity) 35 (*)    All other components within normal limits  CBC  HEPATIC FUNCTION PANEL  TSH    EKG EKG Interpretation  Date/Time:  Friday April 06 2019 11:10:11 EST Ventricular Rate:  99 PR Interval:    QRS Duration: 74 QT Interval:  334 QTC Calculation: 428 R Axis:   83 Text Interpretation: Atrial flutter with variable A-V block Anterior infarct , age undetermined Abnormal ECG No prior ECG for comparison. S1Q3T3 No STEMI Confirmed by Antony Blackbird [phone]) on 04/06/2019 11:22:43 AM   Radiology DG Chest 2 View  Result Date: 04/06/2019 CLINICAL DATA:  Chest pain EXAM: CHEST - 2 VIEW COMPARISON:  None. FINDINGS: The heart size and mediastinal contours are within normal limits. Both lungs are clear. The visualized skeletal structures are unremarkable. IMPRESSION: No active cardiopulmonary disease. Electronically Signed   By: Inez Catalina M.D.   On: 04/06/2019 12:09   CT Angio Chest PE W and/or Wo Contrast  Result Date: 04/06/2019 CLINICAL DATA:  Abdominal distention. Evaluate for mass. Shortness of breath. EXAM: CT ANGIOGRAPHY CHEST CT ABDOMEN AND PELVIS WITH CONTRAST TECHNIQUE: Multidetector CT imaging of the chest was performed using the standard protocol during bolus administration of intravenous contrast. Multiplanar CT image  reconstructions and MIPs were obtained to evaluate the vascular anatomy. Multidetector CT imaging of the abdomen and pelvis was performed using the standard protocol during bolus administration of intravenous contrast. CONTRAST:  78mL OMNIPAQUE IOHEXOL 350 MG/ML SOLN COMPARISON:  None. FINDINGS: CTA CHEST FINDINGS Cardiovascular: Heart size appears within normal limits. There is no pericardial effusion identified. Aortic atherosclerosis. Lad and RCA coronary artery calcifications. The main pulmonary artery appears patent. No central obstructing pulmonary emboli identified. No lobar or segmental pulmonary artery filling defects identified bilaterally. Mediastinum/Nodes: Right lobe of thyroid gland nodule is identified measuring 2.3 cm, image 35/7. The trachea appears patent and is midline. Normal appearance of the esophagus. No mediastinal or hilar adenopathy. Lungs/Pleura: No pleural effusion identified. No suspicious pulmonary nodule or mass identified. Musculoskeletal: Spondylosis identified. No aggressive lytic or sclerotic bone lesions. Review of the MIP images confirms the above findings. CT ABDOMEN and PELVIS FINDINGS Hepatobiliary: No focal liver abnormality is seen. No gallstones, gallbladder wall thickening, or biliary dilatation. Pancreas: Unremarkable. No pancreatic ductal dilatation or surrounding inflammatory changes. Spleen: Normal in size without focal abnormality. Adrenals/Urinary Tract: Normal appearance of the adrenal glands. 2.1 cm right kidney cyst. Bilateral perinephric fat stranding is noted, nonspecific. This may be related to prior insult. No mass or hydronephrosis identified at this time. Urinary bladder appears unremarkable. Stomach/Bowel: Stomach is within normal limits. Appendix appears normal. No evidence of bowel wall thickening, distention, or inflammatory changes. Vascular/Lymphatic: Aortic atherosclerosis. No enlarged abdominopelvic lymph nodes. Reproductive: Mild prostate gland  enlargement. Other: No free fluid or fluid collections. Musculoskeletal: Spondylosis identified scratch set mild lumbar spondylosis. No acute or suspicious osseous abnormality. Review of the MIP images confirms the above findings. IMPRESSION: 1. No evidence for acute pulmonary embolus. 2. No acute findings identified within the abdomen or pelvis. 3. Aortic atherosclerosis. Coronary artery calcifications noted. 4. Right lobe of thyroid gland nodule measures 2.3 cm. Consider further evaluation with thyroid ultrasound. If patient is clinically hyperthyroid, consider nuclear medicine thyroid uptake  and scan. Aortic Atherosclerosis (ICD10-I70.0). Electronically Signed   By: Kerby Moors M.D.   On: 04/06/2019 14:16   CT ABDOMEN PELVIS W CONTRAST  Result Date: 04/06/2019 CLINICAL DATA:  Abdominal distention. Evaluate for mass. Shortness of breath. EXAM: CT ANGIOGRAPHY CHEST CT ABDOMEN AND PELVIS WITH CONTRAST TECHNIQUE: Multidetector CT imaging of the chest was performed using the standard protocol during bolus administration of intravenous contrast. Multiplanar CT image reconstructions and MIPs were obtained to evaluate the vascular anatomy. Multidetector CT imaging of the abdomen and pelvis was performed using the standard protocol during bolus administration of intravenous contrast. CONTRAST:  12mL OMNIPAQUE IOHEXOL 350 MG/ML SOLN COMPARISON:  None. FINDINGS: CTA CHEST FINDINGS Cardiovascular: Heart size appears within normal limits. There is no pericardial effusion identified. Aortic atherosclerosis. Lad and RCA coronary artery calcifications. The main pulmonary artery appears patent. No central obstructing pulmonary emboli identified. No lobar or segmental pulmonary artery filling defects identified bilaterally. Mediastinum/Nodes: Right lobe of thyroid gland nodule is identified measuring 2.3 cm, image 35/7. The trachea appears patent and is midline. Normal appearance of the esophagus. No mediastinal or hilar  adenopathy. Lungs/Pleura: No pleural effusion identified. No suspicious pulmonary nodule or mass identified. Musculoskeletal: Spondylosis identified. No aggressive lytic or sclerotic bone lesions. Review of the MIP images confirms the above findings. CT ABDOMEN and PELVIS FINDINGS Hepatobiliary: No focal liver abnormality is seen. No gallstones, gallbladder wall thickening, or biliary dilatation. Pancreas: Unremarkable. No pancreatic ductal dilatation or surrounding inflammatory changes. Spleen: Normal in size without focal abnormality. Adrenals/Urinary Tract: Normal appearance of the adrenal glands. 2.1 cm right kidney cyst. Bilateral perinephric fat stranding is noted, nonspecific. This may be related to prior insult. No mass or hydronephrosis identified at this time. Urinary bladder appears unremarkable. Stomach/Bowel: Stomach is within normal limits. Appendix appears normal. No evidence of bowel wall thickening, distention, or inflammatory changes. Vascular/Lymphatic: Aortic atherosclerosis. No enlarged abdominopelvic lymph nodes. Reproductive: Mild prostate gland enlargement. Other: No free fluid or fluid collections. Musculoskeletal: Spondylosis identified scratch set mild lumbar spondylosis. No acute or suspicious osseous abnormality. Review of the MIP images confirms the above findings. IMPRESSION: 1. No evidence for acute pulmonary embolus. 2. No acute findings identified within the abdomen or pelvis. 3. Aortic atherosclerosis. Coronary artery calcifications noted. 4. Right lobe of thyroid gland nodule measures 2.3 cm. Consider further evaluation with thyroid ultrasound. If patient is clinically hyperthyroid, consider nuclear medicine thyroid uptake and scan. Aortic Atherosclerosis (ICD10-I70.0). Electronically Signed   By: Kerby Moors M.D.   On: 04/06/2019 14:16    Procedures Procedures (including critical care time)  Medications Ordered in ED Medications  sodium chloride flush (NS) 0.9 %  injection 3 mL (3 mLs Intravenous Not Given 04/06/19 1443)  apixaban (ELIQUIS) tablet 5 mg (5 mg Oral Given 04/06/19 1734)  iohexol (OMNIPAQUE) 350 MG/ML injection 80 mL (80 mLs Intravenous Contrast Given 04/06/19 1334)  sodium chloride 0.9 % bolus 500 mL (0 mLs Intravenous Stopped 04/06/19 1826)  magnesium sulfate IVPB 2 g 50 mL (0 g Intravenous Stopped 04/06/19 1819)    ED Course  I have reviewed the triage vital signs and the nursing notes.  Pertinent labs & imaging results that were available during my care of the patient were reviewed by me and considered in my medical decision making (see chart for details).    MDM Rules/Calculators/A&P                      Patient presenting for evaluation of weakness and  worsening shortness of breath with exertion.  On exam, patient is tachycardic from 105-115 and irregular.  Reports a baseline history of sinus bradycardia, thus making his mild tachycardia slightly more concerning.  No history of A. fib/a flutter.  Patient reports pain was worse, but is now improving without intervention.  He is not short of breath at rest.  He is not able to feel the palpitations or feel when his symptoms first began.  He is not on blood thinners.  As such, I do not believe he is a good candidate for cardioversion.  Lower suspicion for ACS, however will obtain troponins for further evaluation.  Will obtain labs including TSH, mag, metabolic panel and CBC.  Will obtain x-ray and EKG.   BMP shows slight dehydration, creatinine 1.8 and BUN 36.  Mag slightly low at 1.4.  Troponin mildly elevated at 25, this is likely due to demand.  CBC reassuring, hemoglobin white count normal.  X-ray, no pneumonia.  Low saturation, cardiomegaly.  As patient is having tachycardia, chest pain, shortness of breath, consider PE.  Additionally, patient with distention of the upper abdomen, consider obesity versus mass.  Will obtain CT abdomen as well.  CTA negative for PE.  CT abdomen pelvis without  acute findings.  Delta troponin XX 5, once again stable likely due to demand.  Will discuss with cardiology as patient remains in a flutter, though heart rate is improving with fluids.  Dr. Domenic Polite from cardiology evaluated the patient.  Recommends discharge on Eliquis with close monitoring.  If patient remains symptomatic, they will plan for outpatient cardioversion in several weeks.  No calcium channel blocker or beta-blocker given due to risk for worsening bradycardia when patient is in sinus rhythm.    Discussed findings and plan with pt. At this time, pt appears safe for d/c. Return precautions given. Pt states he understands and agrees to plan.    Final Clinical Impression(s) / ED Diagnoses Final diagnoses:  Atypical atrial flutter [facility])    Rx / DC Orders ED Discharge Orders         Ordered    Amb referral to AFIB Clinic     04/06/19 1718    apixaban (ELIQUIS) 5 MG TABS tablet  2 times daily     04/06/19 1719           Riely Baskett, PA-C 04/06/19 2053    Tegeler, Gwenyth Allegra, MD 04/06/19 2217

## 2019-04-10 ENCOUNTER — Encounter: Payer: Self-pay | Admitting: *Deleted

## 2019-04-10 ENCOUNTER — Ambulatory Visit: Payer: Medicare Other | Admitting: Cardiology

## 2019-04-10 ENCOUNTER — Encounter: Payer: Self-pay | Admitting: Cardiology

## 2019-04-10 ENCOUNTER — Other Ambulatory Visit: Payer: Self-pay

## 2019-04-10 VITALS — BP 140/80 | HR 63 | Ht 67.0 in | Wt 198.0 lb

## 2019-04-10 DIAGNOSIS — N184 Chronic kidney disease, stage 4 (severe): Secondary | ICD-10-CM | POA: Diagnosis not present

## 2019-04-10 DIAGNOSIS — I2510 Atherosclerotic heart disease of native coronary artery without angina pectoris: Secondary | ICD-10-CM | POA: Diagnosis not present

## 2019-04-10 DIAGNOSIS — R001 Bradycardia, unspecified: Secondary | ICD-10-CM

## 2019-04-10 DIAGNOSIS — I483 Typical atrial flutter: Secondary | ICD-10-CM | POA: Diagnosis not present

## 2019-04-10 DIAGNOSIS — I2583 Coronary atherosclerosis due to lipid rich plaque: Secondary | ICD-10-CM

## 2019-04-10 NOTE — Progress Notes (Signed)
Cardiology Office Note:    Date:  04/10/2019   ID:  Ralph Bell, DOB 12/25/1940, MRN [id number]  PCP:  Shaw, William, MD  Cardiologist:  Ezra Denne, MD  Electrophysiologist:  None   Referring MD: Shaw, William, MD  History of Present Illness:    Ralph Bell is a 78 y.o. male here for the follow-up of newly discovered atrial flutter seen in the emergency department on 04/05/2018 with symptoms of fluttering in chest, mild chest pain 2/10, flat low level high-sensitivity troponin.  He felt warm that day.  Felt a little bit of shortness of breath.  Heart rate normally runs in the 40s for him at baseline.  He was seen in consultation by Dr. McDowell, started on Eliquis 5 mg twice a day.  Known reduction in GFR with creatinine occasionally 2.2.  Sees Dr. Sanford.  Back in July a Holter monitor was performed because of bradycardia, occasional heart rates in the 30s in the morning, was able to increase his heart rate during walking or exercise to the 70s/80s.  Saw Dr. Taylor with EP.  Continue to monitor.  No pacemaker.  He still remains in atrial flutter today in clinic 04/10/2019.  We will go ahead and set him up for cardioversion after 3 complete weeks of Eliquis uninterrupted.   Coronary calcium score was done in 2010 and was 171 with calcium in the LAD, less than 25% stenosis at the time.  A pharmacologic stress test was performed in July 2020 that showed no evidence of ischemia.  His mother had multiple myeloma and end-stage renal disease.  Daughter was diagnosed with breast cancer at age of 40.  He goes by the name of Ralph Bell.  He works for [employer] for 51 years and retired at the age of 75.  Quit smoking back in 1975.    Past Medical History:  Diagnosis Date  . ALLERGIC RHINITIS 11/09/2006  . ASTHMA 11/16/2006  . CAD (coronary artery disease)    a. calcification by CT imaging in 2010 b.  low-risk NST in 10/2018  . DIVERTICULITIS, HX OF 11/16/2006  . ED  (erectile dysfunction)   . Family hx colonic polyps   . History of kidney stones   . HYPERLIPIDEMIA 11/09/2006  . HYPERTENSION 11/09/2006  . Rosacea   . Shortness of breath 09/05/2008    No past surgical history on file.  Current Medications: Current Meds  Medication Sig  . allopurinol (ZYLOPRIM) 100 MG tablet TAKE 1 TABLET BY MOUTH  DAILY  . amLODipine (NORVASC) 2.5 MG tablet TAKE 1 TABLET BY MOUTH  DAILY  . apixaban (ELIQUIS) 5 MG TABS tablet Take 1 tablet (5 mg total) by mouth 2 (two) times daily.  . atorvastatin (LIPITOR) 40 MG tablet TAKE 1 TABLET BY MOUTH  DAILY  . doxazosin (CARDURA) 8 MG tablet TAKE 1 TABLET BY MOUTH AT  BEDTIME  . fexofenadine (ALLEGRA) 180 MG tablet Take 1 tablet (180 mg total) by mouth daily.  . glucose blood (ACCU-CHEK AVIVA) test strip Use daily as directed  . olmesartan (BENICAR) 40 MG tablet Take 40 mg by mouth daily.     Allergies:   Sulfacetamide sodium   Social History   Socioeconomic History  . Marital status: Married    Spouse name: Not on file  . Number of children: Not on file  . Years of education: Not on file  . Highest education level: Not on file  Occupational History  . Not on file    Tobacco Use  . Smoking status: Former Smoker    Quit date: 04/05/1973    Years since quitting: 46.0  . Smokeless tobacco: Former User    Quit date: 04/05/1978  Substance and Sexual Activity  . Alcohol use: Yes  . Drug use: No  . Sexual activity: Not on file  Other Topics Concern  . Not on file  Social History Narrative  . Not on file   Social Determinants of Health   Financial Resource Strain:   . Difficulty of Paying Living Expenses: Not on file  Food Insecurity:   . Worried About Running Out of Food in the Last Year: Not on file  . Ran Out of Food in the Last Year: Not on file  Transportation Needs:   . Lack of Transportation (Medical): Not on file  . Lack of Transportation (Non-Medical): Not on file  Physical Activity:   . Days of Exercise  per Week: Not on file  . Minutes of Exercise per Session: Not on file  Stress:   . Feeling of Stress : Not on file  Social Connections:   . Frequency of Communication with Friends and Family: Not on file  . Frequency of Social Gatherings with Friends and Family: Not on file  . Attends Religious Services: Not on file  . Active Member of Clubs or Organizations: Not on file  . Attends Club or Organization Meetings: Not on file  . Marital Status: Not on file     Family History: The patient's family history includes Hypertension in his father.  As above in HPI.  No early family history of CAD  ROS:   Please see the history of present illness.    No fevers chills nausea vomiting syncope bleeding  EKGs/Labs/Other Studies Reviewed:    The following studies were reviewed today: Abdominal aortic exam 2.5 cm on ultrasound 09/25/2018-no aneurysm  Echo 11/01/2018-normal EF with mildly dilated left atrium  EKG:  EKG is  ordered today.  The ekg ordered today demonstrates 04/10/2019-typical atrial flutter 64 bpm with 41 conduction-prior sinus bradycardia 57 with nonspecific ST-T wave changes.  Recent Labs: 04/06/2019: ALT 38; BUN 36; Creatinine, Ser 1.87; Hemoglobin 14.2; Magnesium 1.4; Platelets 177; Potassium 4.7; Sodium 139; TSH 1.143  Recent Lipid Panel    Component Value Date/Time   CHOL 123 09/08/2017 1025   TRIG 113.0 09/08/2017 1025   HDL 45.00 09/08/2017 1025   CHOLHDL 3 09/08/2017 1025   VLDL 22.6 09/08/2017 1025   LDLCALC 55 09/08/2017 1025    Physical Exam:    VS:  BP 140/80   Pulse 63   Ht 5' 7" (1.702 m)   Wt 198 lb (89.8 kg)   SpO2 98%   BMI 31.01 kg/m     Wt Readings from Last 3 Encounters:  04/10/19 198 lb (89.8 kg)  04/06/19 195 lb (88.5 kg)  12/05/18 191 lb (86.6 kg)     GEN: Well nourished, well developed, in no acute distress  HEENT: normal  Neck: no JVD, carotid bruits, or masses Cardiac: RRR rare irregularity; no murmurs, rubs, or gallops,no edema   Respiratory:  clear to auscultation bilaterally, normal work of breathing GI: soft, nontender, nondistended, + BS MS: no deformity or atrophy  Skin: warm and dry, no rash Neuro:  Alert and Oriented x 3, Strength and sensation are intact Psych: euthymic mood, full affect   ASSESSMENT:    1. Typical atrial flutter (HCC)   2. Chronic kidney disease, stage IV (severe) (HCC)     3. Bradycardia   4. Coronary artery disease due to lipid rich plaque    PLAN:    In order of problems listed above:  Atrial flutter persistent -04/06/2019 heart rates were in the 70s to 90s, seen by Dr. McDowell in consultation.  Remember he has had significant bradycardia in the past.  Avoiding AV nodal blockers.  Currently taking Eliquis 5 mg twice a day since 04/06/2019. Woke up with heart was out of rhythm. Body felt hot and worse SOB, 2/10 CP at the time. Felt a flutter in heart.  -We will go ahead and set him up for cardioversion.  He is continue to be in atrial flutter.  Post cardioversion he may have some bradycardia but remember his resting heart rate at home is sometimes in the 40s.  He understands potential risk for pacemaker in his future.  Risks and benefits of procedure have been explained.  Chronic kidney disease stage IV - GFR 29-creatinine has increased from 1.6-2.2.  Avoid NSAIDs.  Dr. Ryan Sanford.  Has some proteinuria.  Possibly diabetic related.  Last creatinine in 04/06/2019 was 1.87.  Coronary atherosclerosis/CAD/DOE - Prior minor LAD calcification with a score of 171 in 2010.  Previously in July 2020, nuclear stress test was low risk with no ischemia, echocardiogram showed normal EF, Holter monitor did show fairly significant bradycardia at times in the 30s and Dr. Taylor with EP saw him, no pacemaker at this time.  When walking around the clinic, he was able to get his heart rate up to 78 bpm with pulse oximeter record.   Hyperlipidemia -LDL 40, excellent less than 70.  Continue with high  intensity statin atorvastatin 40 mg.  No myalgias.  Last ALT 38.  Hypertension with DM - DM has improved. Now off of metformin.  - Olmesartan Cardura amlodipine.  Previously was on losartan but now is on a more potent ARB.  Blood pressure still slightly above goal.  Bradycardia - Resting bradycardia in the 40s, asymptomatic with this.  EP has seen him.  Minimal thrombocytopenia -Continue to monitor, especially with Eliquis -Last platelets performed in emergency department 177.  Excellent.  Medication Adjustments/Labs and Tests Ordered: Current medicines are reviewed at length with the patient today.  Concerns regarding medicines are outlined above.  Orders Placed This Encounter  Procedures  . EKG 12-Lead   No orders of the defined types were placed in this encounter.   Patient Instructions  Medication Instructions:  The current medical regimen is effective;  continue present plan and medications.  *If you need a refill on your cardiac medications before your next appointment, please call your pharmacy*  Testing/Procedures: Your physician has requested that you have a Cardioversion.  Electrical Cardioversion uses a jolt of electricity to your heart either through paddles or wired patches attached to your chest. This is a controlled, usually prescheduled, procedure. This procedure is done at the hospital and you are not awake during the procedure. You usually go home the day of the procedure. Please see the instruction sheet given to you today for more information.  Follow-Up: At [facility], you and your health needs are our priority.  As part of our continuing mission to provide you with exceptional heart care, we have created designated Provider Care Teams.  These Care Teams include your primary Cardiologist (physician) and Advanced Practice Providers (APPs -  Physician Assistants and Nurse Practitioners) who all work together to provide you with the care you need, when you need  it.  Your   next appointment:   4 week(s)  The format for your next appointment:   In Person  Provider:   You may see Kadisha Goodine, MD or one of the following Advanced Practice Providers on your designated Care Team:    Lori Gerhardt, NP  Laura Ingold, NP  Jill McDaniel, NP   Thank you for choosing [facility]!!         Signed, Lavonne Cass, MD  04/10/2019 10:45 AM    [facility] 

## 2019-04-10 NOTE — H&P (View-Only) (Signed)
Cardiology Office Note:    Date:  04/10/2019   ID:  Ralph Bell, DOB [birth year], MRN [id number]  PCP:  Marton Redwood, MD  Cardiologist:  Candee Furbish, MD  Electrophysiologist:  None   Referring MD: Marton Redwood, MD  History of Present Illness:    Ralph Bell is a 79 y.o. male here for the follow-up of newly discovered atrial flutter seen in the emergency department on 04/05/2018 with symptoms of fluttering in chest, mild chest pain 2/10, flat low level high-sensitivity troponin.  He felt warm that day.  Felt a little bit of shortness of breath.  Heart rate normally runs in the 40s for him at baseline.  He was seen in consultation by Dr. Domenic Polite, started on Eliquis 5 mg twice a day.  Known reduction in GFR with creatinine occasionally 2.2.  Sees Dr. Joelyn Oms.  Back in July a Holter monitor was performed because of bradycardia, occasional heart rates in the 30s in the morning, was able to increase his heart rate during walking or exercise to the 70s/80s.  Saw Dr. Lovena Le with EP.  Continue to monitor.  No pacemaker.  He still remains in atrial flutter today in clinic 04/10/2019.  We will go ahead and set him up for cardioversion after 3 complete weeks of Eliquis uninterrupted.   Coronary calcium score was done in 2010 and was 171 with calcium in the LAD, less than 25% stenosis at the time.  A pharmacologic stress test was performed in July 2020 that showed no evidence of ischemia.  His mother had multiple myeloma and end-stage renal disease.  Daughter was diagnosed with breast cancer at age of 21.  He goes by the name of Ralph Bell.  He works for [employer] for 44 years and retired at the age of 76.  Quit smoking back in 1975.    Past Medical History:  Diagnosis Date  . ALLERGIC RHINITIS 11/09/2006  . ASTHMA 11/16/2006  . CAD (coronary artery disease)    a. calcification by CT imaging in 2010 b.  low-risk NST in 10/2018  . DIVERTICULITIS, HX OF 11/16/2006  . ED  (erectile dysfunction)   . Family hx colonic polyps   . History of kidney stones   . HYPERLIPIDEMIA 11/09/2006  . HYPERTENSION 11/09/2006  . Rosacea   . Shortness of breath 09/05/2008    No past surgical history on file.  Current Medications: Current Meds  Medication Sig  . allopurinol (ZYLOPRIM) 100 MG tablet TAKE 1 TABLET BY MOUTH  DAILY  . amLODipine (NORVASC) 2.5 MG tablet TAKE 1 TABLET BY MOUTH  DAILY  . apixaban (ELIQUIS) 5 MG TABS tablet Take 1 tablet (5 mg total) by mouth 2 (two) times daily.  [name] Kitchen atorvastatin (LIPITOR) 40 MG tablet TAKE 1 TABLET BY MOUTH  DAILY  . doxazosin (CARDURA) 8 MG tablet TAKE 1 TABLET BY MOUTH AT  BEDTIME  . fexofenadine (ALLEGRA) 180 MG tablet Take 1 tablet (180 mg total) by mouth daily.  [name] Kitchen glucose blood (ACCU-CHEK AVIVA) test strip Use daily as directed  . olmesartan (BENICAR) 40 MG tablet Take 40 mg by mouth daily.     Allergies:   Sulfacetamide sodium   Social History   Socioeconomic History  . Marital status: Married    Spouse name: Not on file  . Number of children: Not on file  . Years of education: Not on file  . Highest education level: Not on file  Occupational History  . Not on file  Tobacco Use  . Smoking status: Former Smoker    Quit date: 04/05/1973    Years since quitting: 46.0  . Smokeless tobacco: Former Systems developer    Quit date: 04/05/1978  Substance and Sexual Activity  . Alcohol use: Yes  . Drug use: No  . Sexual activity: Not on file  Other Topics Concern  . Not on file  Social History Narrative  . Not on file   Social Determinants of Health   Financial Resource Strain:   . Difficulty of Paying Living Expenses: Not on file  Food Insecurity:   . Worried About Charity fundraiser in the Last Year: Not on file  . Ran Out of Food in the Last Year: Not on file  Transportation Needs:   . Lack of Transportation (Medical): Not on file  . Lack of Transportation (Non-Medical): Not on file  Physical Activity:   . Days of Exercise  per Week: Not on file  . Minutes of Exercise per Session: Not on file  Stress:   . Feeling of Stress : Not on file  Social Connections:   . Frequency of Communication with Friends and Family: Not on file  . Frequency of Social Gatherings with Friends and Family: Not on file  . Attends Religious Services: Not on file  . Active Member of Clubs or Organizations: Not on file  . Attends Archivist Meetings: Not on file  . Marital Status: Not on file     Family History: The patient's family history includes Hypertension in his father.  As above in HPI.  No early family history of CAD  ROS:   Please see the history of present illness.    No fevers chills nausea vomiting syncope bleeding  EKGs/Labs/Other Studies Reviewed:    The following studies were reviewed today: Abdominal aortic exam 2.5 cm on ultrasound 09/25/2018-no aneurysm  Echo 11/01/2018-normal EF with mildly dilated left atrium  EKG:  EKG is  ordered today.  The ekg ordered today demonstrates 04/10/2019-typical atrial flutter 64 bpm with 41 conduction-prior sinus bradycardia 57 with nonspecific ST-T wave changes.  Recent Labs: 04/06/2019: ALT 38; BUN 36; Creatinine, Ser 1.87; Hemoglobin 14.2; Magnesium 1.4; Platelets 177; Potassium 4.7; Sodium 139; TSH 1.143  Recent Lipid Panel    Component Value Date/Time   CHOL 123 09/08/2017 1025   TRIG 113.0 09/08/2017 1025   HDL 45.00 09/08/2017 1025   CHOLHDL 3 09/08/2017 1025   VLDL 22.6 09/08/2017 1025   LDLCALC 55 09/08/2017 1025    Physical Exam:    VS:  BP 140/80   Pulse 63   Ht _0  (1.702 m)   Wt 198 lb (89.8 kg)   SpO2 98%   BMI 31.01 kg/m     Wt Readings from Last 3 Encounters:  04/10/19 198 lb (89.8 kg)  04/06/19 195 lb (88.5 kg)  12/05/18 191 lb (86.6 kg)     GEN: Well nourished, well developed, in no acute distress  HEENT: normal  Neck: no JVD, carotid bruits, or masses Cardiac: RRR rare irregularity; no murmurs, rubs, or gallops,no edema   Respiratory:  clear to auscultation bilaterally, normal work of breathing GI: soft, nontender, nondistended, + BS MS: no deformity or atrophy  Skin: warm and dry, no rash Neuro:  Alert and Oriented x 3, Strength and sensation are intact Psych: euthymic mood, full affect   ASSESSMENT:    1. Typical atrial flutter ([place])   2. Chronic kidney disease, stage IV (severe) ([place])  3. Bradycardia   4. Coronary artery disease due to lipid rich plaque    PLAN:    In order of problems listed above:  Atrial flutter persistent -04/06/2019 heart rates were in the 70s to 90s, seen by Dr. Domenic Polite in consultation.  Remember he has had significant bradycardia in the past.  Avoiding AV nodal blockers.  Currently taking Eliquis 5 mg twice a day since 04/06/2019. Woke up with heart was out of rhythm. Body felt hot and worse SOB, 2/10 CP at the time. Felt a flutter in heart.  -We will go ahead and set him up for cardioversion.  He is continue to be in atrial flutter.  Post cardioversion he may have some bradycardia but remember his resting heart rate at home is sometimes in the 40s.  He understands potential risk for pacemaker in his future.  Risks and benefits of procedure have been explained.  Chronic kidney disease stage IV - GFR 29-creatinine has increased from 1.6-2.2.  Avoid NSAIDs.  Dr. Pearson Grippe.  Has some proteinuria.  Possibly diabetic related.  Last creatinine in 04/06/2019 was 1.87.  Coronary atherosclerosis/CAD/DOE - Prior minor LAD calcification with a score of 171 in 2010.  Previously in July 2020, nuclear stress test was low risk with no ischemia, echocardiogram showed normal EF, Holter monitor did show fairly significant bradycardia at times in the 30s and Dr. Lovena Le with EP saw him, no pacemaker at this time.  When walking around the clinic, he was able to get his heart rate up to 78 bpm with pulse oximeter record.   Hyperlipidemia -LDL 40, excellent less than 70.  Continue with high  intensity statin atorvastatin 40 mg.  No myalgias.  Last ALT 38.  Hypertension with DM - DM has improved. Now off of metformin.  - Olmesartan Cardura amlodipine.  Previously was on losartan but now is on a more potent ARB.  Blood pressure still slightly above goal.  Bradycardia - Resting bradycardia in the 40s, asymptomatic with this.  EP has seen him.  Minimal thrombocytopenia -Continue to monitor, especially with Eliquis -Last platelets performed in emergency department 177.  Excellent.  Medication Adjustments/Labs and Tests Ordered: Current medicines are reviewed at length with the patient today.  Concerns regarding medicines are outlined above.  Orders Placed This Encounter  Procedures  . EKG 12-Lead   No orders of the defined types were placed in this encounter.   Patient Instructions  Medication Instructions:  The current medical regimen is effective;  continue present plan and medications.  *If you need a refill on your cardiac medications before your next appointment, please call your pharmacy*  Testing/Procedures: Your physician has requested that you have a Cardioversion.  Electrical Cardioversion uses a jolt of electricity to your heart either through paddles or wired patches attached to your chest. This is a controlled, usually prescheduled, procedure. This procedure is done at the hospital and you are not awake during the procedure. You usually go home the day of the procedure. Please see the instruction sheet given to you today for more information.  Follow-Up: At [facility], you and your health needs are our priority.  As part of our continuing mission to provide you with exceptional heart care, we have created designated Provider Care Teams.  These Care Teams include your primary Cardiologist (physician) and Advanced Practice Providers (APPs -  Physician Assistants and Nurse Practitioners) who all work together to provide you with the care you need, when you need  it.  Your  next appointment:   4 week(s)  The format for your next appointment:   In Person  Provider:   You may see Candee Furbish, MD or one of the following Advanced Practice Providers on your designated Care Team:    Truitt Merle, NP  Cecilie Kicks, NP  Kathyrn Drown, NP   Thank you for choosing [facility]!!         Signed, Candee Furbish, MD  04/10/2019 10:45 AM    [place]

## 2019-04-10 NOTE — Addendum Note (Signed)
Addended by: Candee Furbish C on: 04/10/2019 10:52 AM   Modules accepted: Orders, SmartSet

## 2019-04-10 NOTE — Patient Instructions (Signed)
Medication Instructions:  The current medical regimen is effective;  continue present plan and medications.  *If you need a refill on your cardiac medications before your next appointment, please call your pharmacy*  Testing/Procedures: Your physician has requested that you have a Cardioversion.  Electrical Cardioversion uses a jolt of electricity to your heart either through paddles or wired patches attached to your chest. This is a controlled, usually prescheduled, procedure. This procedure is done at the hospital and you are not awake during the procedure. You usually go home the day of the procedure. Please see the instruction sheet given to you today for more information.  Follow-Up: At [facility], you and your health needs are our priority.  As part of our continuing mission to provide you with exceptional heart care, we have created designated Provider Care Teams.  These Care Teams include your primary Cardiologist (physician) and Advanced Practice Providers (APPs -  Physician Assistants and Nurse Practitioners) who all work together to provide you with the care you need, when you need it.  Your next appointment:   4 week(s)  The format for your next appointment:   In Person  Provider:   You may see Candee Furbish, MD or one of the following Advanced Practice Providers on your designated Care Team:    Truitt Merle, NP  Cecilie Kicks, NP  Kathyrn Drown, NP   Thank you for choosing [facility]!!

## 2019-04-11 ENCOUNTER — Encounter (HOSPITAL_COMMUNITY): Payer: Self-pay

## 2019-04-11 ENCOUNTER — Ambulatory Visit (HOSPITAL_COMMUNITY): Payer: Medicare Other | Admitting: Nurse Practitioner

## 2019-04-24 ENCOUNTER — Other Ambulatory Visit (HOSPITAL_COMMUNITY)
Admission: RE | Admit: 2019-04-24 | Discharge: 2019-04-24 | Disposition: A | Discharge status: Home / Self Care | Payer: Medicare Other | Source: Ambulatory Visit | Attending: Cardiovascular Disease | Admitting: Cardiovascular Disease

## 2019-04-24 DIAGNOSIS — Z20822 Contact with and (suspected) exposure to covid-19: Secondary | ICD-10-CM | POA: Diagnosis not present

## 2019-04-24 DIAGNOSIS — Z01812 Encounter for preprocedural laboratory examination: Secondary | ICD-10-CM | POA: Diagnosis present

## 2019-04-25 LAB — NOVEL CORONAVIRUS, NAA (HOSP ORDER, SEND-OUT TO REF LAB; TAT 18-24 HRS): SARS-CoV-2, NAA: NOT DETECTED

## 2019-04-27 ENCOUNTER — Encounter (HOSPITAL_COMMUNITY): Payer: Self-pay | Admitting: Cardiovascular Disease

## 2019-04-27 ENCOUNTER — Encounter (HOSPITAL_COMMUNITY)
Admission: RE | Disposition: A | Discharge status: Home / Self Care | Payer: Self-pay | Source: Home / Self Care | Attending: Cardiovascular Disease

## 2019-04-27 ENCOUNTER — Other Ambulatory Visit: Payer: Self-pay

## 2019-04-27 ENCOUNTER — Ambulatory Visit (HOSPITAL_COMMUNITY): Payer: Medicare Other | Admitting: Anesthesiology

## 2019-04-27 ENCOUNTER — Ambulatory Visit (HOSPITAL_COMMUNITY)
Admission: RE | Admit: 2019-04-27 | Discharge: 2019-04-27 | Disposition: A | Discharge status: Home / Self Care | Payer: Medicare Other | Attending: Cardiovascular Disease | Admitting: Cardiovascular Disease

## 2019-04-27 DIAGNOSIS — N184 Chronic kidney disease, stage 4 (severe): Secondary | ICD-10-CM | POA: Diagnosis not present

## 2019-04-27 DIAGNOSIS — J45909 Unspecified asthma, uncomplicated: Secondary | ICD-10-CM | POA: Diagnosis not present

## 2019-04-27 DIAGNOSIS — R001 Bradycardia, unspecified: Secondary | ICD-10-CM | POA: Diagnosis not present

## 2019-04-27 DIAGNOSIS — I129 Hypertensive chronic kidney disease with stage 1 through stage 4 chronic kidney disease, or unspecified chronic kidney disease: Secondary | ICD-10-CM | POA: Insufficient documentation

## 2019-04-27 DIAGNOSIS — Z87891 Personal history of nicotine dependence: Secondary | ICD-10-CM | POA: Insufficient documentation

## 2019-04-27 DIAGNOSIS — E785 Hyperlipidemia, unspecified: Secondary | ICD-10-CM | POA: Diagnosis not present

## 2019-04-27 DIAGNOSIS — I4892 Unspecified atrial flutter: Secondary | ICD-10-CM

## 2019-04-27 DIAGNOSIS — D696 Thrombocytopenia, unspecified: Secondary | ICD-10-CM | POA: Insufficient documentation

## 2019-04-27 DIAGNOSIS — I483 Typical atrial flutter: Secondary | ICD-10-CM | POA: Diagnosis not present

## 2019-04-27 DIAGNOSIS — Z7901 Long term (current) use of anticoagulants: Secondary | ICD-10-CM | POA: Diagnosis not present

## 2019-04-27 DIAGNOSIS — E1122 Type 2 diabetes mellitus with diabetic chronic kidney disease: Secondary | ICD-10-CM | POA: Insufficient documentation

## 2019-04-27 DIAGNOSIS — Z79899 Other long term (current) drug therapy: Secondary | ICD-10-CM | POA: Diagnosis not present

## 2019-04-27 DIAGNOSIS — Z8249 Family history of ischemic heart disease and other diseases of the circulatory system: Secondary | ICD-10-CM | POA: Insufficient documentation

## 2019-04-27 DIAGNOSIS — I2510 Atherosclerotic heart disease of native coronary artery without angina pectoris: Secondary | ICD-10-CM | POA: Diagnosis not present

## 2019-04-27 HISTORY — PX: CARDIOVERSION: SHX1299

## 2019-04-27 LAB — POCT I-STAT, CHEM 8
BUN: 49 mg/dL — ABNORMAL HIGH (ref 8–23)
Calcium, Ion: 1.16 mmol/L (ref 1.15–1.40)
Chloride: 107 mmol/L (ref 98–111)
Creatinine, Ser: 1.9 mg/dL — ABNORMAL HIGH (ref 0.61–1.24)
Glucose, Bld: 180 mg/dL — ABNORMAL HIGH (ref 70–99)
HCT: 41 % (ref 39.0–52.0)
Hemoglobin: 13.9 g/dL (ref 13.0–17.0)
Potassium: 5 mmol/L (ref 3.5–5.1)
Sodium: 139 mmol/L (ref 135–145)
TCO2: 24 mmol/L (ref 22–32)

## 2019-04-27 SURGERY — CARDIOVERSION
Anesthesia: General

## 2019-04-27 MED ORDER — SODIUM CHLORIDE 0.9 % IV SOLN
INTRAVENOUS | Status: AC | PRN
  Administered 2019-04-27: 500 mL via INTRAMUSCULAR

## 2019-04-27 MED ORDER — LIDOCAINE 2% (20 MG/ML) 5 ML SYRINGE
INTRAMUSCULAR | Status: DC | PRN
  Administered 2019-04-27: 60 mg via INTRAVENOUS

## 2019-04-27 MED ORDER — PROPOFOL 10 MG/ML IV BOLUS
INTRAVENOUS | Status: DC | PRN
  Administered 2019-04-27: 75 mg via INTRAVENOUS

## 2019-04-27 NOTE — CV Procedure (Signed)
[place]: Anesthesia: Propofol  DCC x 1 120 J converted from atrial flutter rate 73  To SR/SB rates 48-53 bpm  On Rx anticoagulation with no missed doses No immediate neurologic sequelae  Jenkins Rouge MD [facility]

## 2019-04-27 NOTE — Anesthesia Postprocedure Evaluation (Signed)
Anesthesia Post Note  Patient: Ralph Bell  Procedure(s) Performed: CARDIOVERSION (N/A )     Patient location during evaluation: Endoscopy Anesthesia Type: General Level of consciousness: awake and alert Pain management: pain level controlled Vital Signs Assessment: post-procedure vital signs reviewed and stable Respiratory status: spontaneous breathing, nonlabored ventilation, respiratory function stable and patient connected to nasal cannula oxygen Cardiovascular status: blood pressure returned to baseline and stable Postop Assessment: no apparent nausea or vomiting Anesthetic complications: no    Last Vitals:  Vitals:   04/27/19 0920 04/27/19 0935  BP: 119/69 (!) 142/71  Pulse: 64 (!) 48  Resp: 17 11  Temp:    SpO2: 95% 99%    Last Pain:  Vitals:   04/27/19 0935  TempSrc:   PainSc: 0-No pain                 Janeene Sand P Margaret Staggs

## 2019-04-27 NOTE — Interval H&P Note (Signed)
History and Physical Interval Note:  04/27/2019 8:48 AM  Ralph Bell  has presented today for surgery, with the diagnosis of AFLUTTER.  The various methods of treatment have been discussed with the patient and family. After consideration of risks, benefits and other options for treatment, the patient has consented to  Procedure(s): CARDIOVERSION (N/A) as a surgical intervention.  The patient's history has been reviewed, patient examined, no change in status, stable for surgery.  I have reviewed the patient's chart and labs.  Questions were answered to the patient's satisfaction.     Jenkins Rouge

## 2019-04-27 NOTE — Discharge Instructions (Signed)
Electrical Cardioversion   What can I expect after the procedure?  Your blood pressure, heart rate, breathing rate, and blood oxygen level will be monitored until you leave the hospital or clinic.  Your heart rhythm will be watched to make sure it does not change.  You may have some redness on the skin where the shocks were given. Follow these instructions at home:  Do not drive for 24 hours if you were given a sedative during your procedure.  Take over-the-counter and prescription medicines only as told by your health care provider.  Ask your health care provider how to check your pulse. Check it often.  Rest for 48 hours after the procedure or as told by your health care provider.  Avoid or limit your caffeine use as told by your health care provider.  Keep all follow-up visits as told by your health care provider. This is important. Contact a health care provider if:  You feel like your heart is beating too quickly or your pulse is not regular.  You have a serious muscle cramp that does not go away. Get help right away if:  You have discomfort in your chest.  You are dizzy or you feel faint.  You have trouble breathing or you are short of breath.  Your speech is slurred.  You have trouble moving an arm or leg on one side of your body.  Your fingers or toes turn cold or blue. Summary  Electrical cardioversion is the delivery of a jolt of electricity to restore a normal rhythm to the heart.  This procedure may be done right away in an emergency or may be a scheduled procedure if the condition is not an emergency.  Generally, this is a safe procedure.  After the procedure, check your pulse often as told by your health care provider. This information is not intended to replace advice given to you by your health care provider. Make sure you discuss any questions you have with your health care provider. Document Revised: 10/23/2018 Document Reviewed:  10/23/2018 Elsevier Patient Education  2020 Elsevier Inc.  

## 2019-04-27 NOTE — Anesthesia Preprocedure Evaluation (Addendum)
Anesthesia Evaluation  Patient identified by MRN, date of birth, ID band Patient awake    Reviewed: Allergy & Precautions, NPO status , Patient's Chart, lab work & pertinent test results  Airway Mallampati: II  TM Distance: >3 FB Neck ROM: Full    Dental no notable dental hx.    Pulmonary asthma , former smoker,    Pulmonary exam normal breath sounds clear to auscultation       Cardiovascular hypertension, Pt. on medications + CAD  + dysrhythmias Atrial Fibrillation  Rhythm:Irregular Rate:Normal     Neuro/Psych negative neurological ROS  negative psych ROS   GI/Hepatic negative GI ROS, Neg liver ROS,   Endo/Other  negative endocrine ROS  Renal/GU negative Renal ROS     Musculoskeletal negative musculoskeletal ROS (+)   Abdominal (+) + obese,   Peds  Hematology HLD   Anesthesia Other Findings A-FLUTTER  Reproductive/Obstetrics                            Anesthesia Physical Anesthesia Plan  ASA: III  Anesthesia Plan: General   Post-op Pain Management:    Induction: Intravenous  PONV Risk Score and Plan: 2 and Propofol infusion and Treatment may vary due to age or medical condition  Airway Management Planned: Mask  Additional Equipment:   Intra-op Plan:   Post-operative Plan:   Informed Consent: I have reviewed the patients History and Physical, chart, labs and discussed the procedure including the risks, benefits and alternatives for the proposed anesthesia with the patient or authorized representative who has indicated his/her understanding and acceptance.     Dental advisory given  Plan Discussed with: CRNA  Anesthesia Plan Comments:        Anesthesia Quick Evaluation

## 2019-04-27 NOTE — Transfer of Care (Signed)
Immediate Anesthesia Transfer of Care Note  Patient: Ralph Bell   Procedure(s) Performed: CARDIOVERSION (N/A )  Patient Location: Endoscopy Unit  Anesthesia Type:General  Level of Consciousness: drowsy and patient cooperative  Airway & Oxygen Therapy: Patient Spontanous Breathing  Post-op Assessment: Report given to RN and Post -op Vital signs reviewed and stable  Post vital signs: Reviewed and stable  Last Vitals:  Vitals Value Taken Time  BP 143/66   Temp    Pulse 51   Resp 16   SpO2 98     Last Pain:  Vitals:   04/27/19 0813  TempSrc: Oral  PainSc: 0-No pain         Complications: No apparent anesthesia complications

## 2019-04-30 ENCOUNTER — Encounter: Payer: Self-pay | Admitting: *Deleted

## 2019-04-30 ENCOUNTER — Other Ambulatory Visit: Payer: Self-pay | Admitting: Pharmacist

## 2019-04-30 MED ORDER — APIXABAN 5 MG PO TABS: 5.0000 mg | ORAL_TABLET | Freq: Two times a day (BID) | ORAL | 5 refills | Status: DC

## 2019-05-07 ENCOUNTER — Ambulatory Visit: Payer: Medicare Other

## 2019-05-11 ENCOUNTER — Encounter: Payer: Self-pay | Admitting: Cardiology

## 2019-05-11 ENCOUNTER — Other Ambulatory Visit: Payer: Self-pay

## 2019-05-11 ENCOUNTER — Ambulatory Visit: Payer: Medicare Other | Admitting: Cardiology

## 2019-05-11 VITALS — BP 112/70 | HR 60 | Ht 67.0 in | Wt 197.0 lb

## 2019-05-11 DIAGNOSIS — I4891 Unspecified atrial fibrillation: Secondary | ICD-10-CM | POA: Diagnosis not present

## 2019-05-11 DIAGNOSIS — I483 Typical atrial flutter: Secondary | ICD-10-CM

## 2019-05-11 DIAGNOSIS — R001 Bradycardia, unspecified: Secondary | ICD-10-CM

## 2019-05-11 DIAGNOSIS — I10 Essential (primary) hypertension: Secondary | ICD-10-CM

## 2019-05-11 DIAGNOSIS — E119 Type 2 diabetes mellitus without complications: Secondary | ICD-10-CM

## 2019-05-11 NOTE — Progress Notes (Signed)
Cardiology Office Note:    Date:  05/11/2019   ID:  Ralph Bell, DOB [birth year], MRN [id number]  PCP:  Marton Redwood, MD  Cardiologist:  Candee Furbish, MD  Electrophysiologist:  None   Referring MD: Marton Redwood, MD     History of Present Illness:    Ralph Bell is a 79 y.o. male with CAD, paroxysmal atrial flutter post cardioversion on 04/27/2019, successful, 1 shock.  Return to sinus rhythm.  Current EKG shows heart rate of 60 with PVC.  [place] 4 course next day AFLUTTER, 1/1/  Ok to exercise  Eliquis sent to pharmacy.  Sent in a question about how long he needed to take.  Discussed.  Past Medical History:  Diagnosis Date  . ALLERGIC RHINITIS 11/09/2006  . ASTHMA 11/16/2006  . CAD (coronary artery disease)    a. calcification by CT imaging in 2010 b.  low-risk NST in 10/2018  . DIVERTICULITIS, HX OF 11/16/2006  . ED (erectile dysfunction)   . Family hx colonic polyps   . History of kidney stones   . HYPERLIPIDEMIA 11/09/2006  . HYPERTENSION 11/09/2006  . Rosacea   . Shortness of breath 09/05/2008    Past Surgical History:  Procedure Laterality Date  . CARDIOVERSION N/A 04/27/2019   Procedure: CARDIOVERSION;  Surgeon: Josue Hector, MD;  Location: [facility] ENDOSCOPY;  Service: Cardiovascular;  Laterality: N/A;    Current Medications: Current Meds  Medication Sig  . allopurinol (ZYLOPRIM) 100 MG tablet TAKE 1 TABLET BY MOUTH  DAILY  . amLODipine (NORVASC) 2.5 MG tablet TAKE 1 TABLET BY MOUTH  DAILY  . apixaban (ELIQUIS) 5 MG TABS tablet Take 1 tablet (5 mg total) by mouth 2 (two) times daily.  [name] Kitchen atorvastatin (LIPITOR) 40 MG tablet TAKE 1 TABLET BY MOUTH  DAILY  . doxazosin (CARDURA) 8 MG tablet TAKE 1 TABLET BY MOUTH AT  BEDTIME  . fexofenadine (ALLEGRA) 180 MG tablet Take 1 tablet (180 mg total) by mouth daily.  [name] Kitchen glucose blood (ACCU-CHEK AVIVA) test strip Use daily as directed  . olmesartan (BENICAR) 40 MG tablet Take 40 mg by mouth every evening.      Allergies:    Sulfacetamide sodium   Social History   Socioeconomic History  . Marital status: Married    Spouse name: Not on file  . Number of children: Not on file  . Years of education: Not on file  . Highest education level: Not on file  Occupational History  . Not on file  Tobacco Use  . Smoking status: Former Smoker    Quit date: 04/05/1973    Years since quitting: 46.1  . Smokeless tobacco: Former Systems developer    Quit date: 04/05/1978  Substance and Sexual Activity  . Alcohol use: Yes  . Drug use: No  . Sexual activity: Not on file  Other Topics Concern  . Not on file  Social History Narrative  . Not on file   Social Determinants of Health   Financial Resource Strain:   . Difficulty of Paying Living Expenses: Not on file  Food Insecurity:   . Worried About Charity fundraiser in the Last Year: Not on file  . Ran Out of Food in the Last Year: Not on file  Transportation Needs:   . Lack of Transportation (Medical): Not on file  . Lack of Transportation (Non-Medical): Not on file  Physical Activity:   . Days of Exercise per Week: Not on file  . Minutes of Exercise per  Session: Not on file  Stress:   . Feeling of Stress : Not on file  Social Connections:   . Frequency of Communication with Friends and Family: Not on file  . Frequency of Social Gatherings with Friends and Family: Not on file  . Attends Religious Services: Not on file  . Active Member of Clubs or Organizations: Not on file  . Attends Archivist Meetings: Not on file  . Marital Status: Not on file     Family History: The patient's family history includes Hypertension in his father.  ROS:   Please see the history of present illness.     All other systems reviewed and are negative.  EKGs/Labs/Other Studies Reviewed:    The following studies were reviewed today: Note from hospital cardioversion reviewed, prior echocardiogram showed normal EF mildly dilated left atrium  EKG:  EKG is  ordered today.  The ekg  ordered today demonstrates sinus rhythm PVC heart rate 60  Recent Labs: 04/06/2019: ALT 38; Magnesium 1.4; Platelets 177; TSH 1.143 04/27/2019: BUN 49; Creatinine, Ser 1.90; Hemoglobin 13.9; Potassium 5.0; Sodium 139  Recent Lipid Panel    Component Value Date/Time   CHOL 123 09/08/2017 1025   TRIG 113.0 09/08/2017 1025   HDL 45.00 09/08/2017 1025   CHOLHDL 3 09/08/2017 1025   VLDL 22.6 09/08/2017 1025   LDLCALC 55 09/08/2017 1025    Physical Exam:    VS:  BP 112/70   Pulse 60   Ht 5\' 7"  (1.702 m)   Wt 197 lb (89.4 kg)   BMI 30.85 kg/m     Wt Readings from Last 3 Encounters:  05/11/19 197 lb (89.4 kg)  04/27/19 195 lb (88.5 kg)  04/10/19 198 lb (89.8 kg)     GEN:  Well nourished, well developed in no acute distress HEENT: Normal NECK: No JVD; No carotid bruits LYMPHATICS: No lymphadenopathy CARDIAC: RRR, no murmurs, rubs, gallops RESPIRATORY:  Clear to auscultation without rales, wheezing or rhonchi  ABDOMEN: Soft, non-tender, non-distended MUSCULOSKELETAL:  No edema; No deformity  SKIN: Warm and dry NEUROLOGIC:  Alert and oriented x 3 PSYCHIATRIC:  Normal affect   ASSESSMENT:    1. Typical atrial flutter ([place])   2. Essential hypertension   3. Bradycardia   4. Atrial fibrillation status post cardioversion ([place])   5. Diabetes mellitus with coincident hypertension ([place])    PLAN:    In order of problems listed above:  Paroxysmal atrial flutter -Showed him his pre and post EKG.  Current EKG shows heart rate of 60 sinus rhythm PVC no other abnormalities.  He has had some bradycardia in the past.  Currently stable.  No syncope.  Has seen Dr. Lovena Le in the past for this. -Continue with Eliquis lifelong.  Watch for any signs of bleeding.  He did note that he saw potentially a light blood-streaked in his stool several days ago.  This has not returned.  If this does return or become worse, he will need GI evaluation. -Answered all questions.  Chronic  anticoagulation/secondary hypercoagulable state -Continue with Eliquis.  Continue to monitor hemoglobin, creatinine.  Last creatinine 1.9 hemoglobin 13.9.  Chronic kidney disease stage III/IV -Knows to avoid NSAIDs.  He is off of aspirin with concomitant Eliquis.  Diabetes with essential hypertension -Medications reviewed agree.  Dr. Brigitte Pulse monitoring.   Medication Adjustments/Labs and Tests Ordered: Current medicines are reviewed at length with the patient today.  Concerns regarding medicines are outlined above.  Orders Placed This Encounter  Procedures  .  EKG 12-Lead   No orders of the defined types were placed in this encounter.   Patient Instructions  Your physician recommends that you continue on your current medications as directed. Please refer to the Current Medication list given to you today.  Your physician wants you to follow-up in: [place] NP [place] will receive a reminder letter in the mail two months in advance. If you don't receive a letter, please call our office to schedule the follow-up appointment.     Signed, Candee Furbish, MD  05/11/2019 11:02 AM    [facility]

## 2019-05-11 NOTE — Patient Instructions (Signed)
Your physician recommends that you continue on your current medications as directed. Please refer to the Current Medication list given to you today.  Your physician wants you to follow-up in: [place] NP [place] will receive a reminder letter in the mail two months in advance. If you don't receive a letter, please call our office to schedule the follow-up appointment.

## 2019-05-13 ENCOUNTER — Ambulatory Visit: Payer: Medicare Other | Attending: Internal Medicine

## 2019-05-13 DIAGNOSIS — Z23 Encounter for immunization: Secondary | ICD-10-CM

## 2019-05-13 NOTE — Progress Notes (Signed)
   Covid-19 Vaccination Clinic  Name:  Ralph Bell    MRN: [id number] DOB: 1940/05/10  05/13/2019  [name] was observed post Covid-19 immunization for 30 minutes based on pre-vaccination screening without incidence. He was provided with Vaccine Information Sheet and instruction to access the V-Safe system.   Ralph Bell was instructed to call 911 with any severe reactions post vaccine: Ralph Bell Kitchen Difficulty breathing  . Swelling of your face and throat  . A fast heartbeat  . A bad rash all over your body  . Dizziness and weakness    Immunizations Administered    Name Date Dose VIS Date Route   Pfizer COVID-19 Vaccine 05/13/2019 11:50 AM 0.3 mL 03/16/2019 Intramuscular   Manufacturer: [place]   Lot: [id number]   [place]: [id number]

## 2019-06-06 ENCOUNTER — Ambulatory Visit: Payer: Medicare Other | Attending: Internal Medicine

## 2019-06-06 DIAGNOSIS — Z23 Encounter for immunization: Secondary | ICD-10-CM | POA: Insufficient documentation

## 2019-06-06 NOTE — Progress Notes (Signed)
   Covid-19 Vaccination Clinic  Name:  Ralph Bell    MRN: [id number] DOB: 11/28/1940  06/06/2019  [name] was observed post Covid-19 immunization for 15 minutes without incident. He was provided with Vaccine Information Sheet and instruction to access the V-Safe system.   Mr. Para was instructed to call 911 with any severe reactions post vaccine: [name] Kitchen Difficulty breathing  . Swelling of face and throat  . A fast heartbeat  . A bad rash all over body  . Dizziness and weakness   Immunizations Administered    Name Date Dose VIS Date Route   Pfizer COVID-19 Vaccine 06/06/2019 11:03 AM 0.3 mL 03/16/2019 Intramuscular   Manufacturer: [place]   Lot: [id number]   [place]: [id number]

## 2019-07-19 ENCOUNTER — Other Ambulatory Visit: Payer: Self-pay

## 2019-07-19 ENCOUNTER — Ambulatory Visit (HOSPITAL_COMMUNITY)
Admission: RE | Admit: 2019-07-19 | Discharge: 2019-07-19 | Disposition: A | Discharge status: Home / Self Care | Payer: Medicare Other | Source: Ambulatory Visit | Attending: Physician Assistant | Admitting: Physician Assistant

## 2019-07-19 ENCOUNTER — Telehealth: Payer: Self-pay | Admitting: Cardiology

## 2019-07-19 ENCOUNTER — Encounter (HOSPITAL_COMMUNITY): Payer: Self-pay | Admitting: Physician Assistant

## 2019-07-19 VITALS — BP 140/72 | HR 76 | Ht 67.0 in | Wt 195.2 lb

## 2019-07-19 DIAGNOSIS — Z7901 Long term (current) use of anticoagulants: Secondary | ICD-10-CM | POA: Insufficient documentation

## 2019-07-19 DIAGNOSIS — D6869 Other thrombophilia: Secondary | ICD-10-CM | POA: Diagnosis not present

## 2019-07-19 DIAGNOSIS — R400 Somnolence: Secondary | ICD-10-CM

## 2019-07-19 DIAGNOSIS — I483 Typical atrial flutter: Secondary | ICD-10-CM | POA: Insufficient documentation

## 2019-07-19 DIAGNOSIS — Z87891 Personal history of nicotine dependence: Secondary | ICD-10-CM | POA: Diagnosis not present

## 2019-07-19 DIAGNOSIS — I4892 Unspecified atrial flutter: Secondary | ICD-10-CM | POA: Insufficient documentation

## 2019-07-19 DIAGNOSIS — Z79899 Other long term (current) drug therapy: Secondary | ICD-10-CM | POA: Diagnosis not present

## 2019-07-19 DIAGNOSIS — I2510 Atherosclerotic heart disease of native coronary artery without angina pectoris: Secondary | ICD-10-CM | POA: Diagnosis not present

## 2019-07-19 DIAGNOSIS — R0683 Snoring: Secondary | ICD-10-CM | POA: Diagnosis not present

## 2019-07-19 DIAGNOSIS — E785 Hyperlipidemia, unspecified: Secondary | ICD-10-CM | POA: Diagnosis not present

## 2019-07-19 DIAGNOSIS — Z6830 Body mass index (BMI) 30.0-30.9, adult: Secondary | ICD-10-CM | POA: Diagnosis not present

## 2019-07-19 DIAGNOSIS — I10 Essential (primary) hypertension: Secondary | ICD-10-CM | POA: Diagnosis not present

## 2019-07-19 DIAGNOSIS — E669 Obesity, unspecified: Secondary | ICD-10-CM | POA: Diagnosis not present

## 2019-07-19 NOTE — Telephone Encounter (Signed)
Returned call to Pt.  Pt sees Dr. Marlou Porch for primary cardiologist and Dr. Lovena Le for EP.  Pt has bradycardia, not yet advanced enough for pacemaker per GT.  In January 2021 Pt went in to atrial flutter.  He was started on [facility] at that time and cardioverted end of January.  Since that time Pt has been in rhythm with no issues.  Pt woke up today with HR's 60-80's with some chest pressure.  Pt states he thinks he is out of rhythm.  Discussed with Dr. Marlou Porch.  Would like Pt to be seen in afib clinic today for EKG and further treatment.  Pt scheduled with afib clinic today at 3:00 pm.  Directions and parking code given.  Pt thankful for quick treatment.

## 2019-07-19 NOTE — Progress Notes (Signed)
Primary Care Physician: Marton Redwood, MD Primary Cardiologist: Dr Marlou Porch Primary Electrophysiologist: Dr Lovena Le Referring Physician: Dr Kristian Covey is a 79 y.o. male with a history of CAD, atrial flutter, asthma, HTN, HLD, DM who presents for follow up in the [facility].  The patient was initially diagnosed with atrial flutter 04/06/2019 after presenting to the ED with symptoms of palpitations and mild chest pain. Patient was started on Eliquis for a CHADS2VASC score of 5. He underwent DCCV on 04/27/19 which was successful. Patient was in his usual state of health until 07/19/19 when he began having symptoms of irregular pulse and mild chest discomfort. He is in rate controlled typical atrial flutter today. There were no specific triggers that the patient could identify. He denies significant alcohol use. He does admit to snoring and daytime somnolence.   Today, he denies symptoms of shortness of breath, orthopnea, PND, lower extremity edema, dizziness, presyncope, syncope, bleeding, or neurologic sequela. The patient is tolerating medications without difficulties and is otherwise without complaint today.    Atrial Fibrillation Risk Factors:  he does have symptoms or diagnosis of sleep apnea. he does not have a history of rheumatic fever. he does not have a history of alcohol use. The patient does not have a history of early familial atrial fibrillation or other arrhythmias.  he has a BMI of Body mass index is 30.57 kg/m.[name] Kitchen Filed Weights   07/19/19 1454  Weight: 88.5 kg    Family History  Problem Relation Age of Onset  . Hypertension Father      Atrial Fibrillation Management history:  Previous antiarrhythmic drugs: none Previous cardioversions: 04/27/19 Previous ablations: none CHADS2VASC score: 5 Anticoagulation history: Eliquis   Past Medical History:  Diagnosis Date  . ALLERGIC RHINITIS 11/09/2006  . ASTHMA 11/16/2006  . CAD  (coronary artery disease)    a. calcification by CT imaging in 2010 b.  low-risk NST in 10/2018  . DIVERTICULITIS, HX OF 11/16/2006  . ED (erectile dysfunction)   . Family hx colonic polyps   . History of kidney stones   . HYPERLIPIDEMIA 11/09/2006  . HYPERTENSION 11/09/2006  . Rosacea   . Shortness of breath 09/05/2008   Past Surgical History:  Procedure Laterality Date  . CARDIOVERSION N/A 04/27/2019   Procedure: CARDIOVERSION;  Surgeon: Josue Hector, MD;  Location: [facility] ENDOSCOPY;  Service: Cardiovascular;  Laterality: N/A;    Current Outpatient Medications  Medication Sig Dispense Refill  . allopurinol (ZYLOPRIM) 100 MG tablet TAKE 1 TABLET BY MOUTH  DAILY 90 tablet 0  . amLODipine (NORVASC) 2.5 MG tablet TAKE 1 TABLET BY MOUTH  DAILY 90 tablet 3  . apixaban (ELIQUIS) 5 MG TABS tablet Take 1 tablet (5 mg total) by mouth 2 (two) times daily. 60 tablet 5  . atorvastatin (LIPITOR) 40 MG tablet TAKE 1 TABLET BY MOUTH  DAILY 90 tablet 3  . doxazosin (CARDURA) 8 MG tablet TAKE 1 TABLET BY MOUTH AT  BEDTIME 90 tablet 0  . fexofenadine (ALLEGRA) 180 MG tablet Take 1 tablet (180 mg total) by mouth daily. 90 tablet 3  . glucose blood (ACCU-CHEK AVIVA) test strip Use daily as directed 100 each 6  . olmesartan (BENICAR) 40 MG tablet Take 40 mg by mouth every evening.      No current facility-administered medications for this encounter.    Allergies  Allergen Reactions  . Sulfacetamide Sodium Other (See Comments)    Unknown (Childhood)  Social History   Socioeconomic History  . Marital status: Married    Spouse name: Not on file  . Number of children: Not on file  . Years of education: Not on file  . Highest education level: Not on file  Occupational History  . Not on file  Tobacco Use  . Smoking status: Former Smoker    Quit date: 04/05/1973    Years since quitting: 46.3  . Smokeless tobacco: Former Systems developer    Quit date: 04/05/1978  Substance and Sexual Activity  . Alcohol use: Yes      Alcohol/week: 1.0 standard drinks    Types: 1 Glasses of wine per week  . Drug use: No  . Sexual activity: Not on file  Other Topics Concern  . Not on file  Social History Narrative  . Not on file   Social Determinants of Health   Financial Resource Strain:   . Difficulty of Paying Living Expenses:   Food Insecurity:   . Worried About Charity fundraiser in the Last Year:   . Arboriculturist in the Last Year:   Transportation Needs:   . Film/video editor (Medical):   [name] Kitchen Lack of Transportation (Non-Medical):   Physical Activity:   . Days of Exercise per Week:   . Minutes of Exercise per Session:   Stress:   . Feeling of Stress :   Social Connections:   . Frequency of Communication with Friends and Family:   . Frequency of Social Gatherings with Friends and Family:   . Attends Religious Services:   . Active Member of Clubs or Organizations:   . Attends Archivist Meetings:   [name] Kitchen Marital Status:   Intimate Partner Violence:   . Fear of Current or Ex-Partner:   . Emotionally Abused:   [name] Kitchen Physically Abused:   . Sexually Abused:      ROS- All systems are reviewed and negative except as per the HPI above.  Physical Exam: Vitals:   07/19/19 1454  BP: 140/72  Pulse: 76  Weight: 88.5 kg  Height: 5\' 7"  (1.702 m)    GEN- The patient is well appearing elderly obese male, alert and oriented x 3 today.   Head- normocephalic, atraumatic Eyes-  Sclera clear, conjunctiva pink Ears- hearing intact Oropharynx- clear Neck- supple  Lungs- Clear to ausculation bilaterally, normal work of breathing Heart- irregular rate and rhythm, no murmurs, rubs or gallops  GI- soft, NT, ND, + BS Extremities- no clubbing, cyanosis, or edema MS- no significant deformity or atrophy Skin- no rash or lesion Psych- euthymic mood, full affect Neuro- strength and sensation are intact  Wt Readings from Last 3 Encounters:  07/19/19 88.5 kg  05/11/19 89.4 kg  04/27/19 88.5 kg     EKG today demonstrates typical atrial flutter with variable conduction HR 76, QRS 74, QTc 432  Echo 11/01/18 demonstrated  1. The left ventricle has hyperdynamic systolic function, with an  ejection fraction of >65%. The cavity size was normal. There is mildly  increased left ventricular wall thickness. Left ventricular diastolic  Doppler parameters are indeterminate. No  evidence of left ventricular regional wall motion abnormalities.  2. The right ventricle has normal systolic function. The cavity was  normal. There is no increase in right ventricular wall thickness.  3. Left atrial size was mildly dilated.  4. The mitral valve is abnormal. Mild thickening of the mitral valve  leaflet.  5. The tricuspid valve is grossly normal.  6. The  aortic valve is tricuspid. Mild sclerosis of the aortic valve. No  stenosis of the aortic valve.  7. The aorta is normal in size and structure.  8. The average left ventricular global longitudinal strain is -22.0 %.   SUMMARY    LVEF 65-70%, mild LVH, normal wall motion, indeterminate diastolic  function, normal RV systolic function, normal GLS, mild LAE, trivial  MR, aortic valve sclerosis, trivial TR, RVSP 14 mmHg, normal IVC   Epic records are reviewed at length today  CHA2DS2-VASc Score = 5 The patient's score is based upon: CHF History: No HTN History: Yes Age : 43 + Diabetes History: Yes Stroke History: No Vascular Disease History: Yes Gender: Male      ASSESSMENT AND PLAN: 1. Typical atrial flutter The patient's CHA2DS2-VASc score is 5, indicating a 7.2% annual risk of stroke.   S/p DCCV on 04/27/19. We discussed therapeutic options today including ablation. After discussing the risks and benefits, patient would like to be considered. No afib seen on past ECGs.  Continue Eliquis 5 mg BID Avoid AV nodal agents given significant bradycardia at baseline.  2. Secondary Hypercoagulable State (ICD10:  D68.69) The patient  is at significant risk for stroke/thromboembolism based upon his CHA2DS2-VASc Score of 5.  Continue Apixaban (Eliquis).   3. Obesity Body mass index is 30.57 kg/m. Lifestyle modification was discussed at length including regular exercise and weight reduction.  4. Snoring/daytime somnolence  The importance of adequate treatment of sleep apnea was discussed today in order to improve our ability to maintain sinus rhythm long term. Will refer for sleep study.  5. HTN Stable, no changes today.   Follow up with Dr Lovena Le to consider ablation.    [facility] [address] [place], [place] [address] [phone] 07/19/2019 3:47 PM

## 2019-07-19 NOTE — Telephone Encounter (Signed)
Patient c/o Palpitations:  High priority if patient c/o lightheadedness, shortness of breath, or chest pain  1) How long have you had palpitations/irregular HR/ Afib? Are you having the symptoms now?  Yes   Are you currently experiencing lightheadedness, SOB or CP?  Yes  2) Do you have a history of afib (atrial fibrillation) or irregular heart rhythm? yes  3) Have you checked your BP or HR? (document readings if available): 45, 85, 60   Are you experiencing any other symptoms? Pressure from his irregular hb Patient wanted to come in for an EKG to see if his heart is still in rhythm

## 2019-08-01 ENCOUNTER — Encounter: Payer: Self-pay | Admitting: Internal Medicine

## 2019-08-01 ENCOUNTER — Other Ambulatory Visit: Payer: Self-pay

## 2019-08-01 ENCOUNTER — Ambulatory Visit: Payer: Medicare Other | Admitting: Internal Medicine

## 2019-08-01 VITALS — BP 100/58 | HR 49 | Ht 67.0 in | Wt 190.3 lb

## 2019-08-01 DIAGNOSIS — I483 Typical atrial flutter: Secondary | ICD-10-CM | POA: Diagnosis not present

## 2019-08-01 DIAGNOSIS — R001 Bradycardia, unspecified: Secondary | ICD-10-CM

## 2019-08-01 LAB — CBC WITH DIFFERENTIAL/PLATELET
Basophils Absolute: 0 10*3/uL (ref 0.0–0.2)
Basos: 0 %
EOS (ABSOLUTE): 0.2 10*3/uL (ref 0.0–0.4)
Eos: 3 %
Hematocrit: 40.3 % (ref 37.5–51.0)
Hemoglobin: 13.4 g/dL (ref 13.0–17.7)
Lymphocytes Absolute: 1.3 10*3/uL (ref 0.7–3.1)
Lymphs: 18 %
MCH: 29.6 pg (ref 26.6–33.0)
MCHC: 33.3 g/dL (ref 31.5–35.7)
MCV: 89 fL (ref 79–97)
Monocytes Absolute: 0.6 10*3/uL (ref 0.1–0.9)
Monocytes: 8 %
Neutrophils Absolute: 5.2 10*3/uL (ref 1.4–7.0)
Neutrophils: 71 %
Platelets: 168 10*3/uL (ref 150–450)
RBC: 4.52 x10E6/uL (ref 4.14–5.80)
RDW: 13.9 % (ref 11.6–15.4)
WBC: 7.3 10*3/uL (ref 3.4–10.8)

## 2019-08-01 LAB — BASIC METABOLIC PANEL
BUN/Creatinine Ratio: 23 (ref 10–24)
BUN: 51 mg/dL — ABNORMAL HIGH (ref 8–27)
CO2: 23 mmol/L (ref 20–29)
Calcium: 9.6 mg/dL (ref 8.6–10.2)
Chloride: 107 mmol/L — ABNORMAL HIGH (ref 96–106)
Creatinine, Ser: 2.22 mg/dL — ABNORMAL HIGH (ref 0.76–1.27)
GFR calc Af Amer: 32 mL/min/{1.73_m2} — ABNORMAL LOW (ref >59)
GFR calc non Af Amer: 27 mL/min/{1.73_m2} — ABNORMAL LOW (ref >59)
Glucose: 218 mg/dL — ABNORMAL HIGH (ref 65–99)
Potassium: 5.4 mmol/L — ABNORMAL HIGH (ref 3.5–5.2)
Sodium: 137 mmol/L (ref 134–144)

## 2019-08-01 NOTE — H&P (View-Only) (Signed)
HPI [name] is referred today by Ralph Peals, PA-C for evaluation of atrial flutter. He was in the hospital in January with symptomatic atrial flutter and was cardioverted and placed on systemic anti-coagulation. He did well until developing recurrent palpitations and was seen several days ago and is back in atrial flutter. He has not missed his systemic anti-coagulation.  Allergies  Allergen Reactions  . Sulfacetamide Sodium Other (See Comments)    Unknown (Childhood)     Current Outpatient Medications  Medication Sig Dispense Refill  . allopurinol (ZYLOPRIM) 100 MG tablet TAKE 1 TABLET BY MOUTH  DAILY 90 tablet 0  . amLODipine (NORVASC) 2.5 MG tablet TAKE 1 TABLET BY MOUTH  DAILY 90 tablet 3  . apixaban (ELIQUIS) 5 MG TABS tablet Take 1 tablet (5 mg total) by mouth 2 (two) times daily. 60 tablet 5  . atorvastatin (LIPITOR) 40 MG tablet TAKE 1 TABLET BY MOUTH  DAILY 90 tablet 3  . doxazosin (CARDURA) 8 MG tablet TAKE 1 TABLET BY MOUTH AT  BEDTIME 90 tablet 0  . fexofenadine (ALLEGRA) 180 MG tablet Take 1 tablet (180 mg total) by mouth daily. 90 tablet 3  . glucose blood (ACCU-CHEK AVIVA) test strip Use daily as directed 100 each 6  . olmesartan (BENICAR) 40 MG tablet Take 40 mg by mouth every evening.      No current facility-administered medications for this visit.     Past Medical History:  Diagnosis Date  . ALLERGIC RHINITIS 11/09/2006  . ASTHMA 11/16/2006  . CAD (coronary artery disease)    a. calcification by CT imaging in 2010 b.  low-risk NST in 10/2018  . DIVERTICULITIS, HX OF 11/16/2006  . ED (erectile dysfunction)   . Family hx colonic polyps   . History of kidney stones   . HYPERLIPIDEMIA 11/09/2006  . HYPERTENSION 11/09/2006  . Rosacea   . Shortness of breath 09/05/2008    ROS:   All systems reviewed and negative except as noted in the HPI.   Past Surgical History:  Procedure Laterality Date  . CARDIOVERSION N/A 04/27/2019   Procedure: CARDIOVERSION;   Surgeon: Josue Hector, MD;  Location: [facility] ENDOSCOPY;  Service: Cardiovascular;  Laterality: N/A;     Family History  Problem Relation Age of Onset  . Hypertension Father      Social History   Socioeconomic History  . Marital status: Married    Spouse name: Not on file  . Number of children: Not on file  . Years of education: Not on file  . Highest education level: Not on file  Occupational History  . Not on file  Tobacco Use  . Smoking status: Former Smoker    Quit date: 04/05/1973    Years since quitting: 46.3  . Smokeless tobacco: Former Systems developer    Quit date: 04/05/1978  Substance and Sexual Activity  . Alcohol use: Yes    Alcohol/week: 1.0 standard drinks    Types: 1 Glasses of wine per week  . Drug use: No  . Sexual activity: Not on file  Other Topics Concern  . Not on file  Social History Narrative  . Not on file   Social Determinants of Health   Financial Resource Strain:   . Difficulty of Paying Living Expenses:   Food Insecurity:   . Worried About Charity fundraiser in the Last Year:   . Arboriculturist in the Last Year:   Transportation Needs:   . Lack  of Transportation (Medical):   [name] Kitchen Lack of Transportation (Non-Medical):   Physical Activity:   . Days of Exercise per Week:   . Minutes of Exercise per Session:   Stress:   . Feeling of Stress :   Social Connections:   . Frequency of Communication with Friends and Family:   . Frequency of Social Gatherings with Friends and Family:   . Attends Religious Services:   . Active Member of Clubs or Organizations:   . Attends Archivist Meetings:   [name] Kitchen Marital Status:   Intimate Partner Violence:   . Fear of Current or Ex-Partner:   . Emotionally Abused:   [name] Kitchen Physically Abused:   . Sexually Abused:      BP (!) 100/58   Pulse (!) 49   Ht 5\' 7"  (1.702 m)   Wt 190 lb 4.8 oz (86.3 kg)   SpO2 97%   BMI 29.81 kg/m   Physical Exam:  Well appearing NAD HEENT: Unremarkable Neck:  No JVD, no  thyromegally Lymphatics:  No adenopathy Back:  No CVA tenderness Lungs:  Clear HEART:  Regular rate rhythm, no murmurs, no rubs, no clicks Abd:  soft, positive bowel sounds, no organomegally, no rebound, no guarding Ext:  2 plus pulses, no edema, no cyanosis, no clubbing Skin:  No rashes no nodules Neuro:  CN II through XII intact, motor grossly intact  EKG - reviewed, atrial flutter  Assess/Plan: 1. Recurrent atrial flutter - I have outlined the treatment options with the patient and the risks/benefits/goals/expectations of EP study and catheter ablation of atrial flutter were reviewed and he wishes to proceed. He will continue his systemic anti-coagulation until the day of his procedure. We will restart the evening of the procedure and then for 3 weeks after.  2. Sinus node dysfunction - we discussed the remote possibility of the need for PPM insertion.   Mikle Bosworth.D.

## 2019-08-01 NOTE — Progress Notes (Signed)
HPI Ralph Bell is referred today by Adline Peals, PA-C for evaluation of atrial flutter. He was in the hospital in January with symptomatic atrial flutter and was cardioverted and placed on systemic anti-coagulation. He did well until developing recurrent palpitations and was seen several days ago and is back in atrial flutter. He has not missed his systemic anti-coagulation.  Allergies  Allergen Reactions  . Sulfacetamide Sodium Other (See Comments)    Unknown (Childhood)     Current Outpatient Medications  Medication Sig Dispense Refill  . allopurinol (ZYLOPRIM) 100 MG tablet TAKE 1 TABLET BY MOUTH  DAILY 90 tablet 0  . amLODipine (NORVASC) 2.5 MG tablet TAKE 1 TABLET BY MOUTH  DAILY 90 tablet 3  . apixaban (ELIQUIS) 5 MG TABS tablet Take 1 tablet (5 mg total) by mouth 2 (two) times daily. 60 tablet 5  . atorvastatin (LIPITOR) 40 MG tablet TAKE 1 TABLET BY MOUTH  DAILY 90 tablet 3  . doxazosin (CARDURA) 8 MG tablet TAKE 1 TABLET BY MOUTH AT  BEDTIME 90 tablet 0  . fexofenadine (ALLEGRA) 180 MG tablet Take 1 tablet (180 mg total) by mouth daily. 90 tablet 3  . glucose blood (ACCU-CHEK AVIVA) test strip Use daily as directed 100 each 6  . olmesartan (BENICAR) 40 MG tablet Take 40 mg by mouth every evening.      No current facility-administered medications for this visit.     Past Medical History:  Diagnosis Date  . ALLERGIC RHINITIS 11/09/2006  . ASTHMA 11/16/2006  . CAD (coronary artery disease)    a. calcification by CT imaging in 2010 b.  low-risk NST in 10/2018  . DIVERTICULITIS, HX OF 11/16/2006  . ED (erectile dysfunction)   . Family hx colonic polyps   . History of kidney stones   . HYPERLIPIDEMIA 11/09/2006  . HYPERTENSION 11/09/2006  . Rosacea   . Shortness of breath 09/05/2008    ROS:   All systems reviewed and negative except as noted in the HPI.   Past Surgical History:  Procedure Laterality Date  . CARDIOVERSION N/A 04/27/2019   Procedure: CARDIOVERSION;   Surgeon: Josue Hector, MD;  Location: [facility] ENDOSCOPY;  Service: Cardiovascular;  Laterality: N/A;     Family History  Problem Relation Age of Onset  . Hypertension Father      Social History   Socioeconomic History  . Marital status: Married    Spouse name: Not on file  . Number of children: Not on file  . Years of education: Not on file  . Highest education level: Not on file  Occupational History  . Not on file  Tobacco Use  . Smoking status: Former Smoker    Quit date: 04/05/1973    Years since quitting: 46.3  . Smokeless tobacco: Former Systems developer    Quit date: 04/05/1978  Substance and Sexual Activity  . Alcohol use: Yes    Alcohol/week: 1.0 standard drinks    Types: 1 Glasses of wine per week  . Drug use: No  . Sexual activity: Not on file  Other Topics Concern  . Not on file  Social History Narrative  . Not on file   Social Determinants of Health   Financial Resource Strain:   . Difficulty of Paying Living Expenses:   Food Insecurity:   . Worried About Charity fundraiser in the Last Year:   . Arboriculturist in the Last Year:   Transportation Needs:   . Lack  of Transportation (Medical):   [name] Kitchen Lack of Transportation (Non-Medical):   Physical Activity:   . Days of Exercise per Week:   . Minutes of Exercise per Session:   Stress:   . Feeling of Stress :   Social Connections:   . Frequency of Communication with Friends and Family:   . Frequency of Social Gatherings with Friends and Family:   . Attends Religious Services:   . Active Member of Clubs or Organizations:   . Attends Archivist Meetings:   [name] Kitchen Marital Status:   Intimate Partner Violence:   . Fear of Current or Ex-Partner:   . Emotionally Abused:   [name] Kitchen Physically Abused:   . Sexually Abused:      BP (!) 100/58   Pulse (!) 49   Ht 5\' 7"  (1.702 m)   Wt 190 lb 4.8 oz (86.3 kg)   SpO2 97%   BMI 29.81 kg/m   Physical Exam:  Well appearing NAD HEENT: Unremarkable Neck:  No JVD, no  thyromegally Lymphatics:  No adenopathy Back:  No CVA tenderness Lungs:  Clear HEART:  Regular rate rhythm, no murmurs, no rubs, no clicks Abd:  soft, positive bowel sounds, no organomegally, no rebound, no guarding Ext:  2 plus pulses, no edema, no cyanosis, no clubbing Skin:  No rashes no nodules Neuro:  CN II through XII intact, motor grossly intact  EKG - reviewed, atrial flutter  Assess/Plan: 1. Recurrent atrial flutter - I have outlined the treatment options with the patient and the risks/benefits/goals/expectations of EP study and catheter ablation of atrial flutter were reviewed and he wishes to proceed. He will continue his systemic anti-coagulation until the day of his procedure. We will restart the evening of the procedure and then for 3 weeks after.  2. Sinus node dysfunction - we discussed the remote possibility of the need for PPM insertion.   Mikle Bosworth.D.

## 2019-08-01 NOTE — Patient Instructions (Addendum)
Medication Instructions:  Your physician recommends that you continue on your current medications as directed. Please refer to the Current Medication list given to you today.  Labwork: You will get lab work today:  BMP and CBC  Testing/Procedures: None ordered.  Follow-Up:  SEE INSTRUCTION LETTER  Any Other Special Instructions Will Be Listed Below (If Applicable).  If you need a refill on your cardiac medications before your next appointment, please call your pharmacy.    Cardiac Ablation Cardiac ablation is a procedure to disable (ablate) a small amount of heart tissue in very specific places. The heart has many electrical connections. Sometimes these connections are abnormal and can cause the heart to beat very fast or irregularly. Ablating some of the problem areas can improve the heart rhythm or return it to normal. Ablation may be done for people who:  Have Wolff-Parkinson-White syndrome.  Have fast heart rhythms (tachycardia).  Have taken medicines for an abnormal heart rhythm (arrhythmia) that were not effective or caused side effects.  Have a high-risk heartbeat that may be life-threatening. During the procedure, a small incision is made in the neck or the groin, and a long, thin, flexible tube (catheter) is inserted into the incision and moved to the heart. Small devices (electrodes) on the tip of the catheter will send out electrical currents. A type of X-ray (fluoroscopy) will be used to help guide the catheter and to provide images of the heart. Tell a health care provider about:  Any allergies you have.  All medicines you are taking, including vitamins, herbs, eye drops, creams, and over-the-counter medicines.  Any problems you or family members have had with anesthetic medicines.  Any blood disorders you have.  Any surgeries you have had.  Any medical conditions you have, such as kidney failure.  Whether you are pregnant or may be pregnant. What are the  risks? Generally, this is a safe procedure. However, problems may occur, including:  Infection.  Bruising and bleeding at the catheter insertion site.  Bleeding into the chest, especially into the sac that surrounds the heart. This is a serious complication.  Stroke or blood clots.  Damage to other structures or organs.  Allergic reaction to medicines or dyes.  Need for a permanent pacemaker if the normal electrical system is damaged. A pacemaker is a small computer that sends electrical signals to the heart and helps your heart beat normally.  The procedure not being fully effective. This may not be recognized until months later. Repeat ablation procedures are sometimes required. What happens before the procedure?  Follow instructions from your health care provider about eating or drinking restrictions.  Ask your health care provider about: ? Changing or stopping your regular medicines. This is especially important if you are taking diabetes medicines or blood thinners. ? Taking medicines such as aspirin and ibuprofen. These medicines can thin your blood. Do not take these medicines before your procedure if your health care provider instructs you not to.  Plan to have someone take you home from the hospital or clinic.  If you will be going home right after the procedure, plan to have someone with you for 24 hours. What happens during the procedure?  To lower your risk of infection: ? Your health care team will wash or sanitize their hands. ? Your skin will be washed with soap. ? Hair may be removed from the incision area.  An IV tube will be inserted into one of your veins.  You will be given a   medicine to help you relax (sedative).  The skin on your neck or groin will be numbed.  An incision will be made in your neck or your groin.  A needle will be inserted through the incision and into a large vein in your neck or groin.  A catheter will be inserted into the needle  and moved to your heart.  Dye may be injected through the catheter to help your surgeon see the area of the heart that needs treatment.  Electrical currents will be sent from the catheter to ablate heart tissue in desired areas. There are three types of energy that may be used to ablate heart tissue: ? Heat (radiofrequency energy). ? Laser energy. ? Extreme cold (cryoablation).  When the necessary tissue has been ablated, the catheter will be removed.  Pressure will be held on the catheter insertion area to prevent excessive bleeding.  A bandage (dressing) will be placed over the catheter insertion area. The procedure may vary among health care providers and hospitals. What happens after the procedure?  Your blood pressure, heart rate, breathing rate, and blood oxygen level will be monitored until the medicines you were given have worn off.  Your catheter insertion area will be monitored for bleeding. You will need to lie still for a few hours to ensure that you do not bleed from the catheter insertion area.  Do not drive for 24 hours or as long as directed by your health care provider. Summary  Cardiac ablation is a procedure to disable (ablate) a small amount of heart tissue in very specific places. Ablating some of the problem areas can improve the heart rhythm or return it to normal.  During the procedure, electrical currents will be sent from the catheter to ablate heart tissue in desired areas. This information is not intended to replace advice given to you by your health care provider. Make sure you discuss any questions you have with your health care provider. Document Revised: 09/12/2017 Document Reviewed: 02/09/2016 Elsevier Patient Education  2020 [employer]   

## 2019-08-03 ENCOUNTER — Telehealth: Payer: Self-pay

## 2019-08-03 NOTE — Telephone Encounter (Signed)
-----   Message from Evans Lance, MD sent at 08/01/2019  8:46 PM EDT ----- Creatinine has worsened. Stop Benicar.

## 2019-08-03 NOTE — Telephone Encounter (Signed)
Sent MyChart message to stop Benicar.

## 2019-08-04 ENCOUNTER — Other Ambulatory Visit (HOSPITAL_COMMUNITY)
Admission: RE | Admit: 2019-08-04 | Discharge: 2019-08-04 | Disposition: A | Discharge status: Home / Self Care | Payer: Medicare Other | Source: Ambulatory Visit | Attending: Internal Medicine | Admitting: Internal Medicine

## 2019-08-04 DIAGNOSIS — Z20822 Contact with and (suspected) exposure to covid-19: Secondary | ICD-10-CM | POA: Diagnosis not present

## 2019-08-04 DIAGNOSIS — Z01812 Encounter for preprocedural laboratory examination: Secondary | ICD-10-CM | POA: Insufficient documentation

## 2019-08-04 LAB — SARS CORONAVIRUS 2 (TAT 6-24 HRS): SARS Coronavirus 2: NEGATIVE

## 2019-08-06 ENCOUNTER — Ambulatory Visit (HOSPITAL_COMMUNITY)
Admission: RE | Disposition: A | Discharge status: Home / Self Care | Payer: Self-pay | Source: Home / Self Care | Attending: Internal Medicine

## 2019-08-06 ENCOUNTER — Ambulatory Visit (HOSPITAL_COMMUNITY)
Admission: RE | Admit: 2019-08-06 | Discharge: 2019-08-06 | Disposition: A | Discharge status: Home / Self Care | Payer: Medicare Other | Attending: Internal Medicine | Admitting: Internal Medicine

## 2019-08-06 ENCOUNTER — Other Ambulatory Visit: Payer: Self-pay

## 2019-08-06 DIAGNOSIS — Z882 Allergy status to sulfonamides status: Secondary | ICD-10-CM | POA: Diagnosis not present

## 2019-08-06 DIAGNOSIS — Z87891 Personal history of nicotine dependence: Secondary | ICD-10-CM | POA: Insufficient documentation

## 2019-08-06 DIAGNOSIS — I2510 Atherosclerotic heart disease of native coronary artery without angina pectoris: Secondary | ICD-10-CM | POA: Diagnosis not present

## 2019-08-06 DIAGNOSIS — Z79899 Other long term (current) drug therapy: Secondary | ICD-10-CM | POA: Diagnosis not present

## 2019-08-06 DIAGNOSIS — E785 Hyperlipidemia, unspecified: Secondary | ICD-10-CM | POA: Insufficient documentation

## 2019-08-06 DIAGNOSIS — I495 Sick sinus syndrome: Secondary | ICD-10-CM | POA: Diagnosis not present

## 2019-08-06 DIAGNOSIS — I10 Essential (primary) hypertension: Secondary | ICD-10-CM | POA: Insufficient documentation

## 2019-08-06 DIAGNOSIS — J45909 Unspecified asthma, uncomplicated: Secondary | ICD-10-CM | POA: Diagnosis not present

## 2019-08-06 DIAGNOSIS — Z7901 Long term (current) use of anticoagulants: Secondary | ICD-10-CM | POA: Insufficient documentation

## 2019-08-06 DIAGNOSIS — Z8249 Family history of ischemic heart disease and other diseases of the circulatory system: Secondary | ICD-10-CM | POA: Insufficient documentation

## 2019-08-06 DIAGNOSIS — I483 Typical atrial flutter: Secondary | ICD-10-CM | POA: Insufficient documentation

## 2019-08-06 HISTORY — PX: A-FLUTTER ABLATION: EP1230

## 2019-08-06 SURGERY — A-FLUTTER ABLATION

## 2019-08-06 MED ORDER — HEPARIN (PORCINE) IN NACL 2-0.9 UNITS/ML
INTRAMUSCULAR | Status: AC | PRN
  Administered 2019-08-06: 500 mL

## 2019-08-06 MED ORDER — FENTANYL CITRATE (PF) 100 MCG/2ML IJ SOLN
INTRAMUSCULAR | Status: AC
  Filled 2019-08-06: qty 2

## 2019-08-06 MED ORDER — SODIUM CHLORIDE 0.9 % IV SOLN: 250.0000 mL | INTRAVENOUS | Status: DC | PRN

## 2019-08-06 MED ORDER — ACETAMINOPHEN 325 MG PO TABS: 650.0000 mg | ORAL_TABLET | ORAL | Status: DC | PRN

## 2019-08-06 MED ORDER — SODIUM CHLORIDE 0.9 % IV SOLN: INTRAVENOUS | Status: DC

## 2019-08-06 MED ORDER — BUPIVACAINE HCL (PF) 0.25 % IJ SOLN
INTRAMUSCULAR | Status: DC | PRN
  Administered 2019-08-06 (×2): 30 mL

## 2019-08-06 MED ORDER — HEPARIN SODIUM (PORCINE) 1000 UNIT/ML IJ SOLN
INTRAMUSCULAR | Status: AC
  Filled 2019-08-06: qty 1

## 2019-08-06 MED ORDER — HEPARIN (PORCINE) IN NACL 1000-0.9 UT/500ML-% IV SOLN
INTRAVENOUS | Status: AC
  Filled 2019-08-06: qty 500

## 2019-08-06 MED ORDER — MIDAZOLAM HCL 5 MG/5ML IJ SOLN
INTRAMUSCULAR | Status: AC
  Filled 2019-08-06: qty 5

## 2019-08-06 MED ORDER — MIDAZOLAM HCL 5 MG/5ML IJ SOLN
INTRAMUSCULAR | Status: DC | PRN
  Administered 2019-08-06 (×5): 1 mg via INTRAVENOUS

## 2019-08-06 MED ORDER — SODIUM CHLORIDE 0.9% FLUSH: 3.0000 mL | INTRAVENOUS | Status: DC | PRN

## 2019-08-06 MED ORDER — HEPARIN SODIUM (PORCINE) 1000 UNIT/ML IJ SOLN
INTRAMUSCULAR | Status: DC | PRN
  Administered 2019-08-06: 1000 [IU] via INTRAVENOUS

## 2019-08-06 MED ORDER — ONDANSETRON HCL 4 MG/2ML IJ SOLN: 4.0000 mg | Freq: Four times a day (QID) | INTRAMUSCULAR | Status: DC | PRN

## 2019-08-06 MED ORDER — BUPIVACAINE HCL (PF) 0.25 % IJ SOLN
INTRAMUSCULAR | Status: AC
  Filled 2019-08-06: qty 30

## 2019-08-06 MED ORDER — FENTANYL CITRATE (PF) 100 MCG/2ML IJ SOLN
INTRAMUSCULAR | Status: DC | PRN
  Administered 2019-08-06 (×5): 25 ug via INTRAVENOUS

## 2019-08-06 MED ORDER — SODIUM CHLORIDE 0.9% FLUSH: 3.0000 mL | Freq: Two times a day (BID) | INTRAVENOUS | Status: DC

## 2019-08-06 SURGICAL SUPPLY — 12 items
BAG SNAP BAND KOVER 36X36 (MISCELLANEOUS) ×1 IMPLANT
CATH JOSEPH QUAD ALLRED 6F REP (CATHETERS) ×1 IMPLANT
CATH NAVISTAR SMARTTOUCH FJ (ABLATOR) ×1 IMPLANT
CATH WEBSTER BI DIR CS D-F CRV (CATHETERS) ×1 IMPLANT
PACK EP LATEX FREE (CUSTOM PROCEDURE TRAY) ×2
PACK EP LF (CUSTOM PROCEDURE TRAY) ×1 IMPLANT
PAD PRO RADIOLUCENT 2001M-C (PAD) ×2 IMPLANT
PATCH CARTO3 (PAD) ×1 IMPLANT
SHEATH PINNACLE 6F 10CM (SHEATH) ×1 IMPLANT
SHEATH PINNACLE 7F 10CM (SHEATH) ×1 IMPLANT
SHEATH PINNACLE 8F 10CM (SHEATH) ×1 IMPLANT
TUBING SMART ABLATE COOLFLOW (TUBING) ×1 IMPLANT

## 2019-08-06 NOTE — Interval H&P Note (Signed)
History and Physical Interval Note:  08/06/2019 11:52 AM  Ralph Bell  has presented today for surgery, with the diagnosis of Atrial Flutter.  The various methods of treatment have been discussed with the patient and family. After consideration of risks, benefits and other options for treatment, the patient has consented to  Procedure(s): A-FLUTTER ABLATION (N/A) as a surgical intervention.  The patient's history has been reviewed, patient examined, no change in status, stable for surgery.  I have reviewed the patient's chart and labs.  Questions were answered to the patient's satisfaction.     Ralph Bell

## 2019-08-06 NOTE — Progress Notes (Signed)
Site area- right  Site Prior to Removal- 0   Pressure Applied For-  20 MInutes   Bedrest Beginning at - 1410   Manual- Yes   Patient Status During Pull- Stable    Post Pull Groin Site- 0   Post Pull Instructions Given- Yes   Post Pull Pulses Present- Yes    Dressing Applied- Tegaderm and Gauze Dressing    Comments:  tol well.

## 2019-08-06 NOTE — Progress Notes (Signed)
Discharge instructions reviewed with patient and family. Verbalized understanding. Patient will stay on bed rest until 2030 this evening and be discharged once seen by physician.

## 2019-08-06 NOTE — Discharge Instructions (Signed)
Post procedure care instructions No driving for 4 days. No lifting over 5 lbs for 1 week. No vigorous or sexual activity for 1 week. You may return to work/your usual activities on 08/13/19. Keep procedure site clean & dry. If you notice increased pain, swelling, bleeding or pus, call/return!  You may shower, but no soaking baths/hot tubs/pools for 1 week.    Cardiac Ablation, Care After This sheet gives you information about how to care for yourself after your procedure. Your health care provider may also give you more specific instructions. If you have problems or questions, contact your health care provider. What can I expect after the procedure? After the procedure, it is common to have:  Bruising around your puncture site.  Tenderness around your puncture site.  Skipped heartbeats.  Tiredness (fatigue). Follow these instructions at home: Puncture site care   Follow instructions from your health care provider about how to take care of your puncture site. Make sure you: ? Wash your hands with soap and water before you change your bandage (dressing). If soap and water are not available, use hand sanitizer. ? Change your dressing as told by your health care provider. ? Leave stitches (sutures), skin glue, or adhesive strips in place. These skin closures may need to stay in place for up to 2 weeks. If adhesive strip edges start to loosen and curl up, you may trim the loose edges. Do not remove adhesive strips completely unless your health care provider tells you to do that.  Check your puncture site every day for signs of infection. Check for: ? Redness, swelling, or pain. ? Fluid or blood. If your puncture site starts to bleed, lie down on your back, apply firm pressure to the area, and contact your health care provider. ? Warmth. ? Pus or a bad smell. Driving  Ask your health care provider when it is safe for you to drive again after the procedure.  Do not drive or use heavy  machinery while taking prescription pain medicine.  Do not drive for 24 hours if you were given a medicine to help you relax (sedative) during your procedure. Activity  Avoid activities that take a lot of effort for at least 3 days after your procedure.  Do not lift anything that is heavier than 10 lb (4.5 kg), or the limit that you are told, until your health care provider says that it is safe.  Return to your normal activities as told by your health care provider. Ask your health care provider what activities are safe for you. General instructions  Take over-the-counter and prescription medicines only as told by your health care provider.  Do not use any products that contain nicotine or tobacco, such as cigarettes and e-cigarettes. If you need help quitting, ask your health care provider.  Do not take baths, swim, or use a hot tub until your health care provider approves.  Do not drink alcohol for 24 hours after your procedure.  Keep all follow-up visits as told by your health care provider. This is important. Contact a health care provider if:  You have redness, mild swelling, or pain around your puncture site.  You have fluid or blood coming from your puncture site that stops after applying firm pressure to the area.  Your puncture site feels warm to the touch.  You have pus or a bad smell coming from your puncture site.  You have a fever.  You have chest pain or discomfort that spreads to your  neck, jaw, or arm.  You are sweating a lot.  You feel nauseous.  You have a fast or irregular heartbeat.  You have shortness of breath.  You are dizzy or light-headed and feel the need to lie down.  You have pain or numbness in the arm or leg closest to your puncture site. Get help right away if:  Your puncture site suddenly swells.  Your puncture site is bleeding and the bleeding does not stop after applying firm pressure to the area. These symptoms may represent a  serious problem that is an emergency. Do not wait to see if the symptoms will go away. Get medical help right away. Call your local emergency services (911 in the [place] Do not drive yourself to the hospital. Summary  After the procedure, it is normal to have bruising and tenderness at the puncture site in your groin, neck, or forearm.  Check your puncture site every day for signs of infection.  Get help right away if your puncture site is bleeding and the bleeding does not stop after applying firm pressure to the area. This is a medical emergency. This information is not intended to replace advice given to you by your health care provider. Make sure you discuss any questions you have with your health care provider. Document Revised: 03/04/2017 Document Reviewed: 07/01/2016 Elsevier Patient Education  [place].

## 2019-08-10 ENCOUNTER — Ambulatory Visit: Payer: Medicare Other | Admitting: Internal Medicine

## 2019-08-10 MED FILL — Midazolam HCl Inj 5 MG/5ML (Base Equivalent): INTRAMUSCULAR | Qty: 5 | Status: AC

## 2019-09-06 ENCOUNTER — Other Ambulatory Visit: Payer: Self-pay | Admitting: *Deleted

## 2019-09-06 MED ORDER — APIXABAN 5 MG PO TABS: 5.0000 mg | ORAL_TABLET | Freq: Two times a day (BID) | ORAL | 1 refills | Status: DC

## 2019-09-06 NOTE — Telephone Encounter (Signed)
Eliquis 5mg  refill request received. Patient is 79 years old, weight-85.3kg, Crea-2.22 on 07/12/2019, Diagnosis-Aflutter, and last seen by Dr. Lovena Le on 08/01/2019 and Dr. Marlou Porch on 05/11/2019. Dose is appropriate based on dosing criteria. Will send in refill to requested pharmacy.

## 2019-09-12 ENCOUNTER — Encounter: Payer: Self-pay | Admitting: Internal Medicine

## 2019-09-12 ENCOUNTER — Other Ambulatory Visit: Payer: Self-pay

## 2019-09-12 ENCOUNTER — Ambulatory Visit: Payer: Medicare Other | Admitting: Internal Medicine

## 2019-09-12 VITALS — BP 118/62 | HR 49 | Ht 67.0 in | Wt 191.6 lb

## 2019-09-12 DIAGNOSIS — I483 Typical atrial flutter: Secondary | ICD-10-CM | POA: Diagnosis not present

## 2019-09-12 DIAGNOSIS — R001 Bradycardia, unspecified: Secondary | ICD-10-CM

## 2019-09-12 NOTE — Progress Notes (Signed)
HPI [name] returns today for followup. He is a pleasant 79 yo man with atrial flutter who underwent EP study and ablation over a month ago. Post procedure he had sinus brady. He has had occaisional nose bleeds. He has not had any atrial arrhythmias since his ablation. He feels well.  Allergies  Allergen Reactions  . Sulfacetamide Sodium Other (See Comments)    Unknown (Childhood)     Current Outpatient Medications  Medication Sig Dispense Refill  . acetaminophen (TYLENOL) 500 MG tablet Take 1,000 mg by mouth every 6 (six) hours as needed for mild pain.    [name] Kitchen allopurinol (ZYLOPRIM) 100 MG tablet TAKE 1 TABLET BY MOUTH  DAILY 90 tablet 0  . amLODipine (NORVASC) 2.5 MG tablet TAKE 1 TABLET BY MOUTH  DAILY 90 tablet 3  . apixaban (ELIQUIS) 5 MG TABS tablet Take 1 tablet (5 mg total) by mouth 2 (two) times daily. 180 tablet 1  . atorvastatin (LIPITOR) 40 MG tablet TAKE 1 TABLET BY MOUTH  DAILY 90 tablet 3  . doxazosin (CARDURA) 8 MG tablet TAKE 1 TABLET BY MOUTH AT  BEDTIME 90 tablet 0  . fexofenadine (ALLEGRA) 180 MG tablet Take 1 tablet (180 mg total) by mouth daily. 90 tablet 3  . glucose blood (ACCU-CHEK AVIVA) test strip Use daily as directed 100 each 6  . metroNIDAZOLE (METROGEL) 1 % gel Apply 1 application topically as needed (Rosacea).     No current facility-administered medications for this visit.     Past Medical History:  Diagnosis Date  . ALLERGIC RHINITIS 11/09/2006  . ASTHMA 11/16/2006  . CAD (coronary artery disease)    a. calcification by CT imaging in 2010 b.  low-risk NST in 10/2018  . DIVERTICULITIS, HX OF 11/16/2006  . ED (erectile dysfunction)   . Family hx colonic polyps   . History of kidney stones   . HYPERLIPIDEMIA 11/09/2006  . HYPERTENSION 11/09/2006  . Rosacea   . Shortness of breath 09/05/2008    ROS:   All systems reviewed and negative except as noted in the HPI.   Past Surgical History:  Procedure Laterality Date  . A-FLUTTER ABLATION N/A  08/06/2019   Procedure: A-FLUTTER ABLATION;  Surgeon: Evans Lance, MD;  Location: [place] [facility];  Service: Cardiovascular;  Laterality: N/A;  . CARDIOVERSION N/A 04/27/2019   Procedure: CARDIOVERSION;  Surgeon: Josue Hector, MD;  Location: [facility] ENDOSCOPY;  Service: Cardiovascular;  Laterality: N/A;     Family History  Problem Relation Age of Onset  . Hypertension Father      Social History   Socioeconomic History  . Marital status: Married    Spouse name: Not on file  . Number of children: Not on file  . Years of education: Not on file  . Highest education level: Not on file  Occupational History  . Not on file  Tobacco Use  . Smoking status: Former Smoker    Quit date: 04/05/1973    Years since quitting: 46.4  . Smokeless tobacco: Former Systems developer    Quit date: 04/05/1978  Substance and Sexual Activity  . Alcohol use: Yes    Alcohol/week: 1.0 standard drinks    Types: 1 Glasses of wine per week  . Drug use: No  . Sexual activity: Not on file  Other Topics Concern  . Not on file  Social History Narrative  . Not on file   Social Determinants of Health   Financial Resource Strain:   .  Difficulty of Paying Living Expenses:   Food Insecurity:   . Worried About Charity fundraiser in the Last Year:   . Arboriculturist in the Last Year:   Transportation Needs:   . Film/video editor (Medical):   [name] Kitchen Lack of Transportation (Non-Medical):   Physical Activity:   . Days of Exercise per Week:   . Minutes of Exercise per Session:   Stress:   . Feeling of Stress :   Social Connections:   . Frequency of Communication with Friends and Family:   . Frequency of Social Gatherings with Friends and Family:   . Attends Religious Services:   . Active Member of Clubs or Organizations:   . Attends Archivist Meetings:   [name] Kitchen Marital Status:   Intimate Partner Violence:   . Fear of Current or Ex-Partner:   . Emotionally Abused:   [name] Kitchen Physically Abused:   . Sexually  Abused:      BP 118/62   Pulse (!) 49   Ht 5\' 7"  (1.702 m)   Wt 191 lb 9.6 oz (86.9 kg)   SpO2 98%   BMI 30.01 kg/m   Physical Exam:  Well appearing NAD HEENT: Unremarkable Neck:  6 cm JVD, no thyromegally Lymphatics:  No adenopathy Back:  No CVA tenderness Lungs:  Clear with no wheezes HEART:  Regular brady rhythm, no murmurs, no rubs, no clicks Abd:  soft, positive bowel sounds, no organomegally, no rebound, no guarding Ext:  2 plus pulses, no edema, no cyanosis, no clubbing Skin:  No rashes no nodules Neuro:  CN II through XII intact, motor grossly intact  EKG - sinus brady  Assess/Plan: 1. Atrial flutter - he is doing well after catheter ablation. He will stop his eliquis. 2. Sinus node dysfunction - his resting hr is 49 today. He feels well. He will undergo watchful waiting. 3. HTN - his bp is well controlled. We will follow. 4. CAD - he denies anginal symptoms. We will follow.  Mikle Bosworth.D.

## 2019-09-12 NOTE — Patient Instructions (Addendum)
Medication Instructions:  Your physician has recommended you make the following change in your medication:   STOP taking your ELIQUIS  Labwork: None ordered.  Testing/Procedures: None ordered.  Follow-Up: Your physician wants you to follow-up in: as needed with Dr. Lovena Le.      Any Other Special Instructions Will Be Listed Below (If Applicable).  If you need a refill on your cardiac medications before your next appointment, please call your pharmacy.

## 2019-10-03 ENCOUNTER — Telehealth: Payer: Self-pay | Admitting: Cardiology

## 2019-10-03 NOTE — Telephone Encounter (Signed)
Patient needs a sleep study, thank you in advance!

## 2019-10-03 NOTE — Telephone Encounter (Signed)
Sent to sleep pool 10/03/19.

## 2019-10-04 ENCOUNTER — Telehealth: Payer: Self-pay | Admitting: *Deleted

## 2019-10-04 NOTE — Telephone Encounter (Signed)
Staff message sent to Ralph Bell ok to schedule sleep study. Per [facility] web portal no PA is required. Decision [id number].

## 2019-10-04 NOTE — Telephone Encounter (Signed)
Patient is scheduled for lab study on 10/28/19. Patient understands his sleep study will be done at [facility] sleep lab. Patient understands he will receive a sleep packet in a week or so. Patient understands to call if he does not receive the sleep packet in a timely manner. Patient agrees with treatment and thanked me for call.

## 2019-10-04 NOTE — Telephone Encounter (Signed)
-----   Message from Lauralee Evener, [place] sent at 10/04/2019 11:31 AM EDT ----- Regarding: RE: precert I found the note. This was originally ordered back in April. Ok to schedule. Per [facility] web portal no PA is required. Decision [id number]. ----- Message ----- From: Freada Bergeron, CMA Sent: 10/03/2019   6:35 PM EDT To: Cv Div Sleep Studies Subject: precert                                        Split night

## 2019-10-28 ENCOUNTER — Other Ambulatory Visit: Payer: Self-pay

## 2019-10-28 ENCOUNTER — Ambulatory Visit (HOSPITAL_BASED_OUTPATIENT_CLINIC_OR_DEPARTMENT_OTHER): Payer: Medicare Other | Attending: Physician Assistant | Admitting: Cardiology

## 2019-10-28 DIAGNOSIS — R400 Somnolence: Secondary | ICD-10-CM | POA: Diagnosis not present

## 2019-10-28 DIAGNOSIS — R0683 Snoring: Secondary | ICD-10-CM | POA: Insufficient documentation

## 2019-10-28 DIAGNOSIS — G4733 Obstructive sleep apnea (adult) (pediatric): Secondary | ICD-10-CM | POA: Diagnosis not present

## 2019-10-29 NOTE — Procedures (Signed)
 Patient Name: Ralph Bell, Ralph Bell Study Date: 10/28/2019 Gender: Male D.O.B: 06/22/1940 Age (years): 79 Referring Provider: Clint Fenton PA Height (inches): 67 Interpreting Physician: Traci Turner MD, ABSM Weight (lbs): 185 RPSGT: Dubili, Fred BMI: 29 MRN: [id number] Neck Size: 17.00  CLINICAL INFORMATION Sleep Study Type: Split Night CPAP  Indication for sleep study: Fatigue, OSA, Snoring, Witnessed Apneas  Epworth Sleepiness Score: 3  SLEEP STUDY TECHNIQUE As per the AASM Manual for the Scoring of Sleep and Associated Events v2.3 (April 2016) with a hypopnea requiring 4% desaturations.  The channels recorded and monitored were frontal, central and occipital EEG, electrooculogram (EOG), submentalis EMG (chin), nasal and oral airflow, thoracic and abdominal wall motion, anterior tibialis EMG, snore microphone, electrocardiogram, and pulse oximetry. Continuous positive airway pressure (CPAP) was initiated when the patient met split night criteria and was titrated according to treat sleep-disordered breathing.  MEDICATIONS Medications self-administered by patient taken the night of the study : N/A  RESPIRATORY PARAMETERS Diagnostic Total AHI (/hr): 69.8  RDI (/hr): 77.6  OA Index (/hr): 69.8  CA Index (/hr): 0.0 REM AHI (/hr): 80.0  NREM AHI (/hr):68.8  Supine AHI (/hr):N/A  Non-supine AHI (/hr): 69.8 Min O2 Sat (%):84.0  Mean O2 (%): 95.0  Time below 88% (min):1   Titration Optimal Pressure (cm):10  AHI at Optimal Pressure (/hr):0.0  Min O2 at Optimal Pressure (%):92.0 Supine % at Optimal (%):0  Sleep % at Optimal (%):98   SLEEP ARCHITECTURE The recording time for the entire night was 403.7 minutes.  During a baseline period of 169.3 minutes, the patient slept for 107.5 minutes in REM and nonREM, yielding a sleep efficiency of 63.5%. Sleep onset after lights out was 18.7 minutes with a REM latency of 134.5 minutes. The patient spent 2.3% of the night in stage N1  sleep, 89.3% in stage N2 sleep, 0.0% in stage N3 and 8.4% in REM.  During the titration period of 232.1 minutes, the patient slept for 192.0 minutes in REM and nonREM, yielding a sleep efficiency of 82.7%. Sleep onset after CPAP initiation was 11.6 minutes with a REM latency of 56.5 minutes. The patient spent 2.3% of the night in stage N1 sleep, 62.8% in stage N2 sleep, 0.0% in stage N3 and 34.9% in REM.  CARDIAC DATA The 2 lead EKG demonstrated sinus rhythm. The mean heart rate was 100.0 beats per minute.   LEG MOVEMENT DATA The total Periodic Limb Movements of Sleep (PLMS) were 0. The PLMS index was 0.0 .  IMPRESSIONS - Severe obstructive sleep apnea occurred during the diagnostic portion of the study (AHI = 69.8/hour). An optimal PAP pressure was selected for this patient ( 10 cm of water) - No significant central sleep apnea occurred during the diagnostic portion of the study (CAI = 0.0/hour). - The patient had minimal or no oxygen desaturation during the diagnostic portion of the study (Min O2 = 84.0%) - No snoring was audible during the diagnostic portion of the study. - Clinically significant periodic limb movements did not occur during sleep.  DIAGNOSIS - Obstructive Sleep Apnea (G47.33)  RECOMMENDATIONS - Trial of CPAP therapy on 10 cm H2O with a Medium size Fisher&Paykel Full Face Mask Simplus mask and heated humidification. - Avoid alcohol, sedatives and other CNS depressants that may worsen sleep apnea and disrupt normal sleep architecture. - Sleep hygiene should be reviewed to assess factors that may improve sleep quality. - Weight management and regular exercise should be initiated or continued. - Return to Sleep Center for re-evaluation   after 8 weeks of therapy  [Electronically signed] 10/29/2019 01:52 PM  Traci Turner MD, ABSM Diplomate, American Board of Sleep Medicine   

## 2019-10-30 ENCOUNTER — Telehealth: Payer: Self-pay | Admitting: *Deleted

## 2019-10-30 DIAGNOSIS — R400 Somnolence: Secondary | ICD-10-CM

## 2019-10-30 NOTE — Telephone Encounter (Signed)
Informed patient of sleep study results and patient understanding was verbalized. Patient understands his sleep study showed they have significant sleep apnea and had successful PAP titration and will be set up with PAP unit. Please let DME know that order is in EPIC. Please set patient up for OV in 8 weeks. Pt is aware and agreeable to his results.  Upon patient request DME selection is [facility]. Patient understands he will be contacted by [facility] to set up his cpap. Patient understands to call if [facility] does not contact him with new setup in a timely manner. Patient understands they will be called once confirmation has been received from [facility] that they have received their new machine to schedule 10 week follow up appointment.  [facility] notified of new cpap order  Please add to airview Patient was grateful for the call and thanked me.

## 2019-10-30 NOTE — Telephone Encounter (Signed)
-----   Message from Sueanne Margarita, MD sent at 10/29/2019  1:55 PM EDT ----- Please let patient know that they have significant sleep apnea and had successful PAP titration and will be set up with PAP unit.  Please let DME know that order is in EPIC.  Please set patient up for OV in 8 weeks

## 2019-11-01 NOTE — Telephone Encounter (Signed)
Patient wants to wait until he talks to his cardiologist to better understand his results before he consents to getting his cpap. Patient will call back on 11/06/19 to tell me to order his cpap.

## 2019-11-07 ENCOUNTER — Telehealth (INDEPENDENT_AMBULATORY_CARE_PROVIDER_SITE_OTHER): Payer: Medicare Other | Admitting: Internal Medicine

## 2019-11-07 ENCOUNTER — Other Ambulatory Visit: Payer: Self-pay

## 2019-11-07 DIAGNOSIS — G4733 Obstructive sleep apnea (adult) (pediatric): Secondary | ICD-10-CM | POA: Diagnosis not present

## 2019-11-07 DIAGNOSIS — I483 Typical atrial flutter: Secondary | ICD-10-CM | POA: Diagnosis not present

## 2019-11-07 NOTE — Progress Notes (Signed)
Electrophysiology TeleHealth Note   Due to national recommendations of social distancing due to COVID 19, an audio/video telehealth visit is felt to be most appropriate for this patient at this time.  See MyChart message from today for the patient's consent to telehealth for [name].   Date:  11/07/2019   ID:  Ralph Bell, DOB 12/30/1940, MRN [id number]  Location: patient's home  Provider location: [address] [place]  Evaluation Performed: Follow-up visit  PCP:  Marton Redwood, MD  Cardiologist:  Candee Furbish, MD  Electrophysiologist:  Dr Lovena Le  Chief Complaint:  " I need to see someone about my sleep study."  History of Present Illness:    Ralph Bell is a 79 y.o. male who presents via audio/video conferencing for a telehealth visit today.  He is a pleasant 79 yo man with sleep apnea diagnosed on sleep study. He also has atrial flutter and underwent cathter ablation. The patient denies palpitations. He has CAD but denies angina. He had a split night sleep study and CPAP was recommended. He refused stating that he wanted to speak to Dr. Radford Pax directly, either in person or virtually.   Past Medical History:  Diagnosis Date  . ALLERGIC RHINITIS 11/09/2006  . ASTHMA 11/16/2006  . CAD (coronary artery disease)    a. calcification by CT imaging in 2010 b.  low-risk NST in 10/2018  . DIVERTICULITIS, HX OF 11/16/2006  . ED (erectile dysfunction)   . Family hx colonic polyps   . History of kidney stones   . HYPERLIPIDEMIA 11/09/2006  . HYPERTENSION 11/09/2006  . Rosacea   . Shortness of breath 09/05/2008    Past Surgical History:  Procedure Laterality Date  . A-FLUTTER ABLATION N/A 08/06/2019   Procedure: A-FLUTTER ABLATION;  Surgeon: Evans Lance, MD;  Location: [place] [facility];  Service: Cardiovascular;  Laterality: N/A;  . CARDIOVERSION N/A 04/27/2019   Procedure: CARDIOVERSION;  Surgeon: Josue Hector, MD;  Location: [facility] ENDOSCOPY;  Service:  Cardiovascular;  Laterality: N/A;    Current Outpatient Medications  Medication Sig Dispense Refill  . acetaminophen (TYLENOL) 500 MG tablet Take 1,000 mg by mouth every 6 (six) hours as needed for mild pain.    [name] Kitchen allopurinol (ZYLOPRIM) 100 MG tablet TAKE 1 TABLET BY MOUTH  DAILY 90 tablet 0  . amLODipine (NORVASC) 2.5 MG tablet TAKE 1 TABLET BY MOUTH  DAILY 90 tablet 3  . atorvastatin (LIPITOR) 40 MG tablet TAKE 1 TABLET BY MOUTH  DAILY 90 tablet 3  . doxazosin (CARDURA) 8 MG tablet TAKE 1 TABLET BY MOUTH AT  BEDTIME 90 tablet 0  . fexofenadine (ALLEGRA) 180 MG tablet Take 1 tablet (180 mg total) by mouth daily. 90 tablet 3  . glucose blood (ACCU-CHEK AVIVA) test strip Use daily as directed 100 each 6  . metroNIDAZOLE (METROGEL) 1 % gel Apply 1 application topically as needed (Rosacea).     No current facility-administered medications for this visit.    Allergies:   Sulfacetamide sodium   Social History:  The patient  reports that he quit smoking about 46 years ago. He quit smokeless tobacco use about 41 years ago. He reports current alcohol use of about 1.0 standard drink of alcohol per week. He reports that he does not use drugs.   Family History:  The patient's  family history includes Hypertension in his father.   ROS:  Please see the history of present illness.   All other systems  are personally reviewed and negative.    Exam:    Vital Signs:  There were no vitals taken for this visit.  Well appearing, alert and conversant, regular work of breathing,  good skin color Eyes- anicteric, neuro- grossly intact, skin- no apparent rash or lesions or cyanosis, mouth- oral mucosa is pink   Labs/Other Tests and Data Reviewed:    Recent Labs: 04/06/2019: ALT 38; Magnesium 1.4; TSH 1.143 08/01/2019: BUN 51; Creatinine, Ser 2.22; Hemoglobin 13.4; Platelets 168; Potassium 5.4; Sodium 137   Wt Readings from Last 3 Encounters:  10/28/19 185 lb (83.9 kg)  09/12/19 191 lb 9.6 oz (86.9 kg)   08/06/19 188 lb (85.3 kg)       ASSESSMENT & PLAN:    1.  Atrial flutter - he is s/p catheter ablatio with no recurrent symptomatic atrial flutter. No change.  2. Sleep apnea - he is pending initiation of CPAP. He had technical questions for me regarding and I recommended he speak to Dr. Radford Pax. 3. Sinus node dysfunction - he is currently asymptomatic. We will follow   COVID 19 screen The patient denies symptoms of COVID 19 at this time.  The importance of social distancing was discussed today.  Follow-up:  As needed Next remote: n/a  Current medicines are reviewed at length with the patient today.   The patient does not have concerns regarding his medicines.  The following changes were made today:  none  Labs/ tests ordered today include: none No orders of the defined types were placed in this encounter.    Patient Risk:  after full review of this patients clinical status, I feel that they are at moderate risk at this time.  Today, I have spent 20 minutes with the patient with telehealth technology discussing all of the above .    Signed, Cristopher Peru, MD  11/07/2019 3:41 PM     [facility] [address] [place] [address] [phone] (office) [phone] (fax)

## 2019-11-09 ENCOUNTER — Telehealth: Payer: Self-pay | Admitting: *Deleted

## 2019-11-09 NOTE — Telephone Encounter (Signed)
-----   Message from Damian Leavell, RN sent at 11/07/2019  3:16 PM EDT ----- Regarding: RE: f/u s/p cpap Dr. Lovena Le tried to make him happy, but he does not want to start his CPAP without speaking to Dr. Radford Pax first.  He said he would be happy with a virtual visit for that.  Sonia Baller ----- Message ----- From: Freada Bergeron, CMA Sent: 11/05/2019   1:48 PM EDT To: Damian Leavell, RN Subject: RE: f/u s/p cpap                               Ok patient said he will call me back on Tuesday 8/3. Thanks ----- Message ----- From: Damian Leavell, RN Sent: 11/01/2019   4:08 PM EDT To: Freada Bergeron, CMA Subject: f/u s/p cpap                                   I got him an appt with Dr. Lovena Le on 8/4 for a telephone visit to discuss his results.  If you could make sure he gets a follow up appt with Dr. Radford Pax please.  Sonia Baller

## 2019-11-09 NOTE — Telephone Encounter (Signed)
Got him an appointment for 8/10 at 9:40. Patient agrees with treatment.  Thanks, Gae Bon

## 2019-11-12 NOTE — Progress Notes (Signed)
Virtual Visit via telephone Note   This visit type was conducted due to national recommendations for restrictions regarding the COVID-19 Pandemic (e.g. social distancing) in an effort to limit this patient's exposure and mitigate transmission in our community.  Due to his co-morbid illnesses, this patient is at least at moderate risk for complications without adequate follow up.  This format is felt to be most appropriate for this patient at this time.  All issues noted in this document were discussed and addressed.  A limited physical exam was performed with this format.  Please refer to the patient's chart for his consent to telehealth for [facility].     Evaluation Performed:  Follow-up visit  This visit type was conducted due to national recommendations for restrictions regarding the COVID-19 Pandemic (e.g. social distancing).  This format is felt to be most appropriate for this patient at this time.  All issues noted in this document were discussed and addressed.  No physical exam was performed (except for noted visual exam findings with Video Visits).  Please refer to the patient's chart (MyChart message for video visits and phone note for telephone visits) for the patient's consent to telehealth for [facility].  Date:  11/13/2019   ID:  Ralph Bell, DOB 1940-06-28, MRN [id number]  Patient Location:  Home  Provider location:   [place]  PCP:  Marton Redwood, MD  Cardiologist:  Candee Furbish, MD  Sleep Medicine:  Fransico Him, MD Electrophysiologist:  None   Chief Complaint:  OSA  History of Present Illness:    Ralph Bell is a 79 y.o. male who presents via audio/video conferencing for a telehealth visit today.    This is a 79yo male with a hx of HTN and atrial flutter who is referred for evaluation of OSA.  He underwent a splint night sleep study showing severe OSA with an AHI of 69.8/hr and O2 sats at low as 84%.  He underwent CPAP titration to 10cm H2O.  The  patient decided he did not want to proceed with PAP therapy until he had an OV to discuss the results of his sleep study and is here today to discuss the results. He tells me that he gets up a lot at night due to his CKD.  He does not sleep well at night and sometimes drools and his pillow is wet so he thinks he may breathe through his mask.   The patient does not have symptoms concerning for COVID-19 infection (fever, chills, cough, or new shortness of breath).    Prior CV studies:   The following studies were reviewed today:  Split night sleep study  Past Medical History:  Diagnosis Date  . ALLERGIC RHINITIS 11/09/2006  . ASTHMA 11/16/2006  . CAD (coronary artery disease)    a. calcification by CT imaging in 2010 b.  low-risk NST in 10/2018  . DIVERTICULITIS, HX OF 11/16/2006  . ED (erectile dysfunction)   . Family hx colonic polyps   . History of kidney stones   . HYPERLIPIDEMIA 11/09/2006  . HYPERTENSION 11/09/2006  . Rosacea   . Shortness of breath 09/05/2008   Past Surgical History:  Procedure Laterality Date  . A-FLUTTER ABLATION N/A 08/06/2019   Procedure: A-FLUTTER ABLATION;  Surgeon: Evans Lance, MD;  Location: [facility];  Service: Cardiovascular;  Laterality: N/A;  . CARDIOVERSION N/A 04/27/2019   Procedure: CARDIOVERSION;  Surgeon: Josue Hector, MD;  Location: [facility];  Service: Cardiovascular;  Laterality: N/A;  Current Meds  Medication Sig  . acetaminophen (TYLENOL) 500 MG tablet Take 1,000 mg by mouth every 6 (six) hours as needed for mild pain.  [name] Kitchen amLODipine (NORVASC) 2.5 MG tablet TAKE 1 TABLET BY MOUTH  DAILY  . atorvastatin (LIPITOR) 40 MG tablet TAKE 1 TABLET BY MOUTH  DAILY  . doxazosin (CARDURA) 8 MG tablet TAKE 1 TABLET BY MOUTH AT  BEDTIME  . FARXIGA 10 MG TABS tablet Take 10 mg by mouth daily.  . fexofenadine (ALLEGRA) 180 MG tablet Take 1 tablet (180 mg total) by mouth daily.  [name] Kitchen glucose blood (ACCU-CHEK AVIVA) test strip Use daily as  directed  . metroNIDAZOLE (METROGEL) 1 % gel Apply 1 application topically as needed (Rosacea).     Allergies:   Sulfacetamide sodium   Social History   Tobacco Use  . Smoking status: Former Smoker    Quit date: 04/05/1973    Years since quitting: 46.6  . Smokeless tobacco: Former Systems developer    Quit date: 04/05/1978  Vaping Use  . Vaping Use: Never used  Substance Use Topics  . Alcohol use: Yes    Alcohol/week: 1.0 standard drink    Types: 1 Glasses of wine per week  . Drug use: No     Family Hx: The patient's family history includes Hypertension in his father.  ROS:   Please see the history of present illness.     All other systems reviewed and are negative.   Labs/Other Tests and Data Reviewed:    Recent Labs: 04/06/2019: ALT 38; Magnesium 1.4; TSH 1.143 08/01/2019: BUN 51; Creatinine, Ser 2.22; Hemoglobin 13.4; Platelets 168; Potassium 5.4; Sodium 137   Recent Lipid Panel Lab Results  Component Value Date/Time   CHOL 123 09/08/2017 10:25 AM   TRIG 113.0 09/08/2017 10:25 AM   HDL 45.00 09/08/2017 10:25 AM   CHOLHDL 3 09/08/2017 10:25 AM   LDLCALC 55 09/08/2017 10:25 AM    Wt Readings from Last 3 Encounters:  11/13/19 184 lb (83.5 kg)  10/28/19 185 lb (83.9 kg)  09/12/19 191 lb 9.6 oz (86.9 kg)     Objective:    Vital Signs:  BP (!) 106/55   Pulse 63   Ht 5\' 7"  (1.702 m)   Wt 184 lb (83.5 kg)   SpO2 97%   BMI 28.82 kg/m     ASSESSMENT & PLAN:    1.  OSA -sleep study showed severe OSA which I discussed at length with the patient.  -I informed him that there is a strong linq between OSA and development of atrial arrhythmias such as the atrial flutter he has had -given the severity of his sleep disordered breathing I have recommended proceeding with PAP therapy. -he is in agreement with starting PAP therapy.  He would like to try an Airfit N30 mask and we will see how he does as I am concerned that he may mouth breathe some.   -I will see him back in 8  weeks  2.  HTN -continue amlodipine 2.5mg  daily, doxazosin 8mg  daily  COVID-19 Education: The signs and symptoms of COVID-19 were discussed with the patient and how to seek care for testing (follow up with PCP or arrange E-visit).  The importance of social distancing was discussed today.  Patient Risk:   After full review of this patient's clinical status, I feel that they are at least moderate risk at this time.  Time:   Today, I have spent 20 minutes on telemedicine discussing medical problems including  OSA, HTN and reviewing patient's chart including split night sleep study.  Medication Adjustments/Labs and Tests Ordered: Current medicines are reviewed at length with the patient today.  Concerns regarding medicines are outlined above.  Tests Ordered: No orders of the defined types were placed in this encounter.  Medication Changes: No orders of the defined types were placed in this encounter.   Disposition:  Follow up in 8 week(s) after he gets his PAP device  Signed, Fransico Him, MD  11/13/2019 10:03 AM    [place]

## 2019-11-13 ENCOUNTER — Other Ambulatory Visit: Payer: Self-pay

## 2019-11-13 ENCOUNTER — Telehealth (INDEPENDENT_AMBULATORY_CARE_PROVIDER_SITE_OTHER): Payer: Medicare Other | Admitting: Cardiology

## 2019-11-13 ENCOUNTER — Encounter: Payer: Self-pay | Admitting: Cardiology

## 2019-11-13 VITALS — BP 106/55 | HR 63 | Ht 67.0 in | Wt 184.0 lb

## 2019-11-13 DIAGNOSIS — G4733 Obstructive sleep apnea (adult) (pediatric): Secondary | ICD-10-CM

## 2019-11-13 DIAGNOSIS — I10 Essential (primary) hypertension: Secondary | ICD-10-CM

## 2019-11-13 NOTE — Addendum Note (Signed)
Addended by: Freada Bergeron on: 11/13/2019 11:36 AM   Modules accepted: Orders

## 2019-11-13 NOTE — Telephone Encounter (Signed)
Ralph Margarita, MD  Freada Bergeron, CMA Patient is ready to start his PAP device.  Please order a ResMed Airfit nasal mask. Followup with me in 8 weeks  Order placed to [facility]

## 2019-11-20 NOTE — Telephone Encounter (Signed)
Pt has completed sleep study and is awaiting equipment and set up from [facility]. Orders were sent 10/30/19 and patient stated he wanted to wait until his 11/13/19 appt with Dr. Radford Pax before he made any further decisions. Appt has been completed and orders have been initialized with [facility]

## 2020-01-11 ENCOUNTER — Telehealth: Payer: Self-pay | Admitting: *Deleted

## 2020-01-11 NOTE — Telephone Encounter (Signed)
Patient has a 10 week follow up appointment scheduled for 02/26/20. Patient understands he needs to keep this appointment for insurance compliance. Patient was grateful for the call and thanked me.

## 2020-01-11 NOTE — Telephone Encounter (Signed)
YOUR CARDIOLOGY TEAM HAS ARRANGED FOR AN E-VISIT FOR YOUR APPOINTMENT - PLEASE REVIEW IMPORTANT INFORMATION BELOW SEVERAL DAYS PRIOR TO YOUR APPOINTMENT  Due to the recent COVID-19 pandemic, we are transitioning in-person office visits to tele-medicine visits in an effort to decrease unnecessary exposure to our patients, their families, and staff. These visits are billed to your insurance just like a normal visit is. We also encourage you to sign up for MyChart if you have not already done so. You will need a smartphone if possible. For patients that do not have this, we can still complete the visit using a regular telephone but do prefer a smartphone to enable video when possible. You may have a family member that lives with you that can help. If possible, we also ask that you have a blood pressure cuff and scale at home to measure your blood pressure, heart rate and weight prior to your scheduled appointment. Patients with clinical needs that need an in-person evaluation and testing will still be able to come to the office if absolutely necessary. If you have any questions, feel free to call our office.     YOUR PROVIDER WILL BE USING THE FOLLOWING PLATFORM TO COMPLETE YOUR VISIStaff: Please delete this text and fill in MyChart/[email or web address]   IF USING MYCHART - How to Download the MyChart App to Your SmartPhone   - If Apple, go to [employer] and type in MyChart in the search bar and download the app. If Android, ask patient to go to [employer] and type in [place] in the search bar and download the app. The app is free but as with any other app downloads, your phone may require you to verify saved payment information or Apple/Android password.  - You will need to then log into the app with your MyChart username and password, and select [facility] as your healthcare provider to link the account.  - When it is time for your visit, go to the MyChart app, find appointments, and click Begin  Video Visit. Be sure to Select Allow for your device to access the Microphone and Camera for your visit. You will then be connected, and your provider will be with you shortly.  **If you have any issues connecting or need assistance, please contact MyChart service desk [phone])**  **If using a computer, in order to ensure the best quality for your visit, you will need to use either of the following Internet Browsers: Insurance underwriter or Microsoft Edge**   IF USING DOXIMITY or [email or web address].ME - The staff will give you instructions on receiving your link to join the meeting the day of your visit.      THE DAY OF YOUR APPOINTMENT  Approximately 15 minutes prior to your scheduled appointment, you will receive a telephone call from one of [facility] team - your caller ID may say "Unknown caller."  Our staff will confirm medications, vital signs for the day and any symptoms you may be experiencing. Please have this information available prior to the time of visit start. It may also be helpful for you to have a pad of paper and pen handy for any instructions given during your visit. They will also walk you through joining the smartphone meeting if this is a video visit.    CONSENT FOR TELE-HEALTH VISIT - PLEASE REVIEW  I hereby voluntarily request, consent and authorize [facility] and its employed or contracted physicians, Engineer, materials, nurse practitioners or other licensed health care professionals (the Practitioner),  to provide me with telemedicine health care services (the Services") as deemed necessary by the treating Practitioner. I acknowledge and consent to receive the Services by the Practitioner via telemedicine. I understand that the telemedicine visit will involve communicating with the Practitioner through live audiovisual communication technology and the disclosure of certain medical information by electronic transmission. I acknowledge that I have been given the  opportunity to request an in-person assessment or other available alternative prior to the telemedicine visit and am voluntarily participating in the telemedicine visit.  I understand that I have the right to withhold or withdraw my consent to the use of telemedicine in the course of my care at any time, without affecting my right to future care or treatment, and that the Practitioner or I may terminate the telemedicine visit at any time. I understand that I have the right to inspect all information obtained and/or recorded in the course of the telemedicine visit and may receive copies of available information for a reasonable fee.  I understand that some of the potential risks of receiving the Services via telemedicine include:   Delay or interruption in medical evaluation due to technological equipment failure or disruption;  Information transmitted may not be sufficient (e.g. poor resolution of images) to allow for appropriate medical decision making by the Practitioner; and/or   In rare instances, security protocols could fail, causing a breach of personal health information.  Furthermore, I acknowledge that it is my responsibility to provide information about my medical history, conditions and care that is complete and accurate to the best of my ability. I acknowledge that Practitioner's advice, recommendations, and/or decision may be based on factors not within their control, such as incomplete or inaccurate data provided by me or distortions of diagnostic images or specimens that may result from electronic transmissions. I understand that the practice of medicine is not an exact science and that Practitioner makes no warranties or guarantees regarding treatment outcomes. I acknowledge that I will receive a copy of this consent concurrently upon execution via email to the email address I last provided but may also request a printed copy by calling the office of [facility].    I understand that  my insurance will be billed for this visit.   I have read or had this consent read to me.  I understand the contents of this consent, which adequately explains the benefits and risks of the Services being provided via telemedicine.   I have been provided ample opportunity to ask questions regarding this consent and the Services and have had my questions answered to my satisfaction.  I give my informed consent for the services to be provided through the use of telemedicine in my medical care  By participating in this telemedicine visit I agree to the above.

## 2020-02-21 ENCOUNTER — Other Ambulatory Visit: Payer: Self-pay

## 2020-02-21 ENCOUNTER — Telehealth (INDEPENDENT_AMBULATORY_CARE_PROVIDER_SITE_OTHER): Payer: Medicare Other | Admitting: Cardiology

## 2020-02-21 ENCOUNTER — Encounter: Payer: Self-pay | Admitting: Cardiology

## 2020-02-21 VITALS — Ht 67.0 in | Wt 187.0 lb

## 2020-02-21 DIAGNOSIS — G4733 Obstructive sleep apnea (adult) (pediatric): Secondary | ICD-10-CM | POA: Diagnosis not present

## 2020-02-21 DIAGNOSIS — I10 Essential (primary) hypertension: Secondary | ICD-10-CM

## 2020-02-21 NOTE — Patient Instructions (Signed)
Medication Instructions:  Your physician recommends that you continue on your current medications as directed. Please refer to the Current Medication list given to you today.  *If you need a refill on your cardiac medications before your next appointment, please call your pharmacy*  Follow-Up: At [facility], you and your health needs are our priority.  As part of our continuing mission to provide you with exceptional heart care, we have created designated Provider Care Teams.  These Care Teams include your primary Cardiologist (physician) and Advanced Practice Providers (APPs -  Physician Assistants and Nurse Practitioners) who all work together to provide you with the care you need, when you need it.  We recommend signing up for the patient portal called "MyChart".  Sign up information is provided on this After Visit Summary.  MyChart is used to connect with patients for Virtual Visits (Telemedicine).  Patients are able to view lab/test results, encounter notes, upcoming appointments, etc.  Non-urgent messages can be sent to your provider as well.   To learn more about what you can do with MyChart, go to [email or web address].    Your next appointment:   1 year(s)  The format for your next appointment:   Virtual Visit   Provider:   Traci Turner, MD    

## 2020-02-21 NOTE — Progress Notes (Signed)
Virtual Visit via telephone Note   This visit type was conducted due to national recommendations for restrictions regarding the COVID-19 Pandemic (e.g. social distancing) in an effort to limit this patient's exposure and mitigate transmission in our community.  Due to his co-morbid illnesses, this patient is at least at moderate risk for complications without adequate follow up.  This format is felt to be most appropriate for this patient at this time.  All issues noted in this document were discussed and addressed.  A limited physical exam was performed with this format.  Please refer to the patient's chart for his consent to telehealth for [facility].     Evaluation Performed:  Follow-up visit  This visit type was conducted due to national recommendations for restrictions regarding the COVID-19 Pandemic (e.g. social distancing).  This format is felt to be most appropriate for this patient at this time.  All issues noted in this document were discussed and addressed.  No physical exam was performed (except for noted visual exam findings with Video Visits).  Please refer to the patient's chart (MyChart message for video visits and phone note for telephone visits) for the patient's consent to telehealth for [facility].  Date:  02/21/2020   ID:  Ralph Bell, DOB 06-16-1940, MRN [id number]  Patient Location:  Home  Provider location:   [place]  PCP:  Marton Redwood, MD  Cardiologist:  Candee Furbish, MD  Sleep Medicine:  Fransico Him, MD Electrophysiologist:  None   Chief Complaint:  OSA  History of Present Illness:    Ralph Bell is a 79 y.o. male who presents via audio/video conferencing for a telehealth visit today.    This is a 80yo male with a hx of HTN and atrial flutter who is referred for evaluation of OSA.  He underwent a splint night sleep study showing severe OSA with an AHI of 69.8/hr and O2 sats at low as 84%.  He underwent CPAP titration to 10cm H2O.  The  patient decided he did not want to proceed with PAP therapy until he had an OV to discuss the results of his sleep study.  He was seen back in August and we decided to proceed with CPAP therapy.  He was placed on auto CPAP and is now back for followup.    He is doing well with his CPAP device and thinks that he has gotten used to it.  He tried a nasal pillow mask but he was breathing through his mouth.  He is now using a FF mask but sometimes has problems with leaking of his mask.  He feels the pressure is adequate.  Since going on CPAP he feels rested in the am and has no significant daytime sleepiness.  He denies any significant mouth or nasal dryness or nasal congestion.  He does not think that he snores.    The patient does not have symptoms concerning for COVID-19 infection (fever, chills, cough, or new shortness of breath).    Prior CV studies:   The following studies were reviewed today:  Split night sleep study, CPAP titration and PAP compliance download  Past Medical History:  Diagnosis Date  . ALLERGIC RHINITIS 11/09/2006  . ASTHMA 11/16/2006  . CAD (coronary artery disease)    a. calcification by CT imaging in 2010 b.  low-risk NST in 10/2018  . DIVERTICULITIS, HX OF 11/16/2006  . ED (erectile dysfunction)   . Family hx colonic polyps   . History of kidney stones   .  HYPERLIPIDEMIA 11/09/2006  . HYPERTENSION 11/09/2006  . Rosacea   . Shortness of breath 09/05/2008   Past Surgical History:  Procedure Laterality Date  . A-FLUTTER ABLATION N/A 08/06/2019   Procedure: A-FLUTTER ABLATION;  Surgeon: Evans Lance, MD;  Location: [facility];  Service: Cardiovascular;  Laterality: N/A;  . CARDIOVERSION N/A 04/27/2019   Procedure: CARDIOVERSION;  Surgeon: Josue Hector, MD;  Location: [facility]OS[facility];  Service: Cardiovascular;  Laterality: N/A;     Current Meds  Medication Sig  . acetaminophen (TYLENOL) 500 MG tablet Take 1,000 mg by mouth every 6 (six) hours as needed for mild  pain.  [name] Kitchen amLODipine (NORVASC) 2.5 MG tablet TAKE 1 TABLET BY MOUTH  DAILY  . atorvastatin (LIPITOR) 40 MG tablet TAKE 1 TABLET BY MOUTH  DAILY  . doxazosin (CARDURA) 8 MG tablet TAKE 1 TABLET BY MOUTH AT  BEDTIME  . FARXIGA 10 MG TABS tablet Take 10 mg by mouth daily.  . fexofenadine (ALLEGRA) 180 MG tablet Take 1 tablet (180 mg total) by mouth daily.  . metroNIDAZOLE (METROGEL) 1 % gel Apply 1 application topically as needed (Rosacea).     Allergies:   Sulfacetamide sodium   Social History   Tobacco Use  . Smoking status: Former Smoker    Quit date: 04/05/1973    Years since quitting: 46.9  . Smokeless tobacco: Former Systems developer    Quit date: 04/05/1978  Vaping Use  . Vaping Use: Never used  Substance Use Topics  . Alcohol use: Yes    Alcohol/week: 1.0 standard drink    Types: 1 Glasses of wine per week  . Drug use: No     Family Hx: The patient's family history includes Hypertension in his father.  ROS:   Please see the history of present illness.     All other systems reviewed and are negative.   Labs/Other Tests and Data Reviewed:    Recent Labs: 04/06/2019: ALT 38; Magnesium 1.4; TSH 1.143 08/01/2019: BUN 51; Creatinine, Ser 2.22; Hemoglobin 13.4; Platelets 168; Potassium 5.4; Sodium 137   Recent Lipid Panel Lab Results  Component Value Date/Time   CHOL 123 09/08/2017 10:25 AM   TRIG 113.0 09/08/2017 10:25 AM   HDL 45.00 09/08/2017 10:25 AM   CHOLHDL 3 09/08/2017 10:25 AM   LDLCALC 55 09/08/2017 10:25 AM    Wt Readings from Last 3 Encounters:  02/21/20 187 lb (84.8 kg)  11/13/19 184 lb (83.5 kg)  10/28/19 185 lb (83.9 kg)     Objective:    Vital Signs:  Ht 5\' 7"  (1.702 m)   Wt 187 lb (84.8 kg)   BMI 29.29 kg/m     ASSESSMENT & PLAN:    1.  OSA - The patient is tolerating PAP therapy well without any problems. The PAP download was reviewed today and showed an AHI of 11.7/hr on 10cm H2O with 100% compliance in using more than 4 hours nightly.  The patient  has been using and benefiting from PAP use and will continue to benefit from therapy.  -he occasionally sleeps on his back and I suspect that is why his AHI is elevated -Recommend changing to auto CPAP from 4 to 15cm H2O  -Repeat download in 3 weeks.  2.  HTN -continue amlodipine 2.5mg  daily, doxazosin 8mg  daily  COVID-19 Education: The signs and symptoms of COVID-19 were discussed with the patient and how to seek care for testing (follow up with PCP or arrange E-visit).  The importance of social  distancing was discussed today.  Patient Risk:   After full review of this patient's clinical status, I feel that they are at least moderate risk at this time.  Time:   Today, I have spent 20 minutes on telemedicine discussing medical problems including OSA, HTN and reviewing patient's chart including split night sleep study.  Medication Adjustments/Labs and Tests Ordered: Current medicines are reviewed at length with the patient today.  Concerns regarding medicines are outlined above.  Tests Ordered: No orders of the defined types were placed in this encounter.  Medication Changes: No orders of the defined types were placed in this encounter.   Disposition:  Follow up in 1 year  Signed, Fransico Him, MD  02/21/2020 9:04 AM    [place]

## 2020-02-22 ENCOUNTER — Telehealth: Payer: Self-pay | Admitting: *Deleted

## 2020-02-22 NOTE — Telephone Encounter (Signed)
-----   Message from Sueanne Margarita, MD sent at 02/21/2020  9:07 AM EST ----- Change to auto CPAP from 4 to 15cm H2O  and get a download in 2 weeks.

## 2020-02-22 NOTE — Telephone Encounter (Signed)
Order placed to [facility]  

## 2020-04-24 ENCOUNTER — Ambulatory Visit (INDEPENDENT_AMBULATORY_CARE_PROVIDER_SITE_OTHER): Payer: Medicare Other

## 2020-04-24 ENCOUNTER — Other Ambulatory Visit: Payer: Self-pay

## 2020-04-24 ENCOUNTER — Ambulatory Visit: Payer: Medicare Other | Admitting: Podiatry

## 2020-04-24 DIAGNOSIS — L84 Corns and callosities: Secondary | ICD-10-CM

## 2020-04-24 DIAGNOSIS — M722 Plantar fascial fibromatosis: Secondary | ICD-10-CM | POA: Diagnosis not present

## 2020-04-24 DIAGNOSIS — M779 Enthesopathy, unspecified: Secondary | ICD-10-CM

## 2020-04-24 MED ORDER — TRIAMCINOLONE ACETONIDE 10 MG/ML IJ SUSP
10.0000 mg | Freq: Once | INTRAMUSCULAR | Status: AC
  Administered 2020-04-24: 10 mg

## 2020-04-25 NOTE — Progress Notes (Signed)
Subjective:   Patient ID: Ralph Bell, male   DOB: 80 y.o.   MRN: [id number]   HPI Patient presents stating that he has developed a lot of pain around his left forefoot and this is been going on now for several months and making it hard for him to walk comfortably.  Does not remember specific injury also gets some pain in his heel that is localized and does not smoke likes to be active if possible   Review of Systems  All other systems reviewed and are negative.       Objective:  Physical Exam Vitals and nursing note reviewed.  Constitutional:      Appearance: He is well-developed and well-nourished.  Cardiovascular:     Pulses: Intact distal pulses.  Pulmonary:     Effort: Pulmonary effort is normal.  Musculoskeletal:        General: Normal range of motion.  Skin:    General: Skin is warm.  Neurological:     Mental Status: He is alert.     Neurovascular status found to be intact muscle strength was found to be adequate range of motion adequate.  Patient is noted to have discomfort in the left foot around the fifth metatarsal head with fluid buildup mild pain in the heel with discomfort of the mild nature also in the right foot.  Patient is found to have good digital perfusion well oriented x3 and does have moderate equinus condition     Assessment:  Increased stress on the fifth metatarsal head leading to inflammatory capsulitis with keratotic lesion formation that is painful along with mild plantar fasciitis bilateral      Plan:  H&P condition reviewed.  We'll get a focus on the fifth metatarsal and I went ahead did sterile prep and injected the capsule 3 mg dexamethasone Kenalog and then debrided a lesion that is painful for him with no iatrogenic bleeding noted and I was able to get a core out.  Discussed good supportive shoe gear try to stay cushion not going barefoot and patient will be seen back as needed  X-rays indicated plantar spur formation no indications of  forefoot pathology as far as arthritis or fracture

## 2020-05-18 NOTE — Progress Notes (Unsigned)
Virtual Visit via Video Note   This visit type was conducted due to national recommendations for restrictions regarding the COVID-19 Pandemic (e.g. social distancing) in an effort to limit this patient's exposure and mitigate transmission in our community.  Due to his co-morbid illnesses, this patient is at least at moderate risk for complications without adequate follow up.  This format is felt to be most appropriate for this patient at this time.  All issues noted in this document were discussed and addressed.  A limited physical exam was performed with this format.  Please refer to the patient's chart for his consent to telehealth for [facility].       Date:  05/20/2020   ID:  LABRANDON Bell, DOB July 04, 1940, MRN [id number] The patient was identified using 2 identifiers.  Patient Location: Home Provider Location: Home Office   PCP:  Marton Redwood, [place]  Cardiologist:  Candee Furbish, MD  Advanced Practice Provider:  Tommie Raymond, NP [phone]   Evaluation Performed:  Follow-Up Visit  Chief Complaint: Follow-up  History of Present Illness:    Ralph Bell is a 80 y.o. male with a history of atrial flutter followed by Dr. Lovena Le with recent AF ablation, DM2, CKD stage III, OSA followed by Dr. Radford Pax, HTN, HLD and CAD with calcifications per CT imaging in 2010 with low risk stress testing 10/2018, seen for Dr. Marlou Porch.  He was most recently seen by Dr. Marlou Porch 05/2019 at which time he was doing well from a CV standpoint.  He was then referred back to Dr. Lovena Le from the atrial fibrillation clinic for the evaluation of atrial flutter.  He has been in the hospital 04/2019 with symptomatic atrial flutter and was cardioverted and placed on systemic anticoagulation.  He developed recurrent palpitations and was seen in the AF clinic found to be in atrial flutter.  Plan at that time was to pursue AF ablation which was performed 08/01/2019.  He was seen in  follow-up with Dr. Lovena Le 09/12/2019 at which time he was taken off of his Eliquis.  He was noted to be mildly bradycardic with HR at 49 bpm which is known for him.  Plan was for watchful waiting as he was asymptomatic.  Coronary CTA 11/01/2008 with a coronary calcium score at 171 primarily in the LAD which was 25-50% for age and sex matched control.  There was no significant CAD with less than 25% calcific CAD the LAD with plans for medical management.  He had a low risk stress test 11/01/2018 with no ischemia or infarct per review.  Today [name] states that he has been doing very well from a CV standpoint.  He denies shortness of breath, LE edema, chest pain, palpitations, PND, dizziness or syncope.  He reports he walks approximately 1 to 1.5 miles per day without symptoms.  Heart rate remains low at 42 bpm which per last EP note was to watchfully wait.  Patient reports he is had bradycardia for many years without symptoms.  We discussed prompt contact with cardiology if symptoms occur.  He agrees.  Lab work performed by his nephrologist last month.  He reports a creatinine around 1.8-1.9 which appears to be at his most recent baseline.  He follows with Dr. Brigitte Pulse for his primary care.  BP is stable and he is tolerating his medications.  The patient does not have symptoms concerning for COVID-19 infection (fever, chills, cough, or new shortness of breath).  Past Medical History:  Diagnosis Date  . ALLERGIC RHINITIS 11/09/2006  . ASTHMA 11/16/2006  . CAD (coronary artery disease)    a. calcification by CT imaging in 2010 b.  low-risk NST in 10/2018  . DIVERTICULITIS, HX OF 11/16/2006  . ED (erectile dysfunction)   . Family hx colonic polyps   . History of kidney stones   . HYPERLIPIDEMIA 11/09/2006  . HYPERTENSION 11/09/2006  . Rosacea   . Shortness of breath 09/05/2008   Past Surgical History:  Procedure Laterality Date  . A-FLUTTER ABLATION N/A 08/06/2019   Procedure: A-FLUTTER ABLATION;  Surgeon:  Evans Lance, MD;  Location: [facility];  Service: Cardiovascular;  Laterality: N/A;  . CARDIOVERSION N/A 04/27/2019   Procedure: CARDIOVERSION;  Surgeon: Josue Hector, MD;  Location: [facility] ENDOSCOPY;  Service: Cardiovascular;  Laterality: N/A;     Current Meds  Medication Sig  . acetaminophen (TYLENOL) 500 MG tablet Take 1,000 mg by mouth every 6 (six) hours as needed for mild pain.  [name] Kitchen allopurinol (ZYLOPRIM) 100 MG tablet Take 100 mg by mouth daily.  [name] Kitchen amLODipine (NORVASC) 2.5 MG tablet TAKE 1 TABLET BY MOUTH  DAILY  . atorvastatin (LIPITOR) 40 MG tablet TAKE 1 TABLET BY MOUTH  DAILY  . doxazosin (CARDURA) 8 MG tablet TAKE 1 TABLET BY MOUTH AT  BEDTIME  . FARXIGA 10 MG TABS tablet Take 10 mg by mouth daily.  . fexofenadine (ALLEGRA) 180 MG tablet Take 1 tablet (180 mg total) by mouth daily.  [name] Kitchen glucose blood (ACCU-CHEK AVIVA) test strip Use daily as directed  . losartan (COZAAR) 25 MG tablet Take 25 mg by mouth daily.  . metroNIDAZOLE (METROGEL) 1 % gel Apply 1 application topically as needed (Rosacea).  . [DISCONTINUED] sacubitril-valsartan (ENTRESTO) 49-51 MG Take 1 tablet by mouth 2 (two) times daily.     Allergies:   Other and Sulfacetamide sodium   Social History   Tobacco Use  . Smoking status: Former Smoker    Quit date: 04/05/1973    Years since quitting: 47.1  . Smokeless tobacco: Former Systems developer    Quit date: 04/05/1978  Vaping Use  . Vaping Use: Never used  Substance Use Topics  . Alcohol use: Yes    Alcohol/week: 1.0 standard drink    Types: 1 Glasses of wine per week  . Drug use: No     Family Hx: The patient's family history includes Hypertension in his father.  ROS:   Please see the history of present illness.     All other systems reviewed and are negative.   Labs/Other Tests and Data Reviewed:    EKG:  No ECG reviewed.  Recent Labs: 08/01/2019: BUN 51; Creatinine, Ser 2.22; Hemoglobin 13.4; Platelets 168; Potassium 5.4; Sodium 137   Recent  Lipid Panel Lab Results  Component Value Date/Time   CHOL 123 09/08/2017 10:25 AM   TRIG 113.0 09/08/2017 10:25 AM   HDL 45.00 09/08/2017 10:25 AM   CHOLHDL 3 09/08/2017 10:25 AM   LDLCALC 55 09/08/2017 10:25 AM    Wt Readings from Last 3 Encounters:  05/20/20 190 lb (86.2 kg)  02/21/20 187 lb (84.8 kg)  11/13/19 184 lb (83.5 kg)     Objective:    Vital Signs:  BP 122/64   Pulse (!) 42   Ht 5\' 7"  (1.702 m)   Wt 190 lb (86.2 kg)   BMI 29.76 kg/m    VITAL SIGNS:  reviewed GEN:  no acute distress EYES:  sclerae  anicteric, EOMI - Extraocular Movements Intact RESPIRATORY:  normal respiratory effort, symmetric expansion NEURO:  alert and oriented x 3, no obvious focal deficit  ASSESSMENT & PLAN:    1.  Atrial flutter s/p AF ablation: -Underwent AF ablation for recurrent atrial flutter 07/2019 and was last seen in follow-up with Dr. Lovena Le. At that time, he was taken off systemic anticoagulation. -Denies palpitations  2.  Sinus node dysfunction: -Has known history of bradycardia when in normal sinus rhythm.  Plan is for watchful waiting as he has been asymptomatic with this.  Last HR on evaluation 09/12/2019 49 bpm -HR today 42 bpm, again without symptoms -Continue watchful waiting although discussed if symptoms occur, prompt contact with cardiology team -No AV nodal blocking agents  3.  HTN: -Stable, continue losartan -Creatinine at baseline per patient report.  Follows with nephrology who is okay with creatinine and losartan use  4.  CAD: -Coronary CTA 11/01/2008 with a coronary calcium score at 171 primarily in the LAD which was 25-50% for age and sex matched control.  There was no significant CAD with less than 25% calcific CAD the LAD with plans for medical management.  He had a low risk stress test 11/01/2018 with no ischemia or infarct per review. -Underwent Lexiscan stress test 10/2018 which showed no signs of ischemia or infarct, low risk study -Continue current  regimen -Denies anginal symptoms  5.  Chronic kidney disease stage III/IV: -Follows with nephrology with most recent baseline at 1.8-1.9, last seen 04/2020     COVID-19 Education: The signs and symptoms of COVID-19 were discussed with the patient and how to seek care for testing (follow up with PCP or arrange E-visit). The importance of social distancing was discussed today.  Time:   Today, I have spent 20 minutes with the patient with telehealth technology discussing the above problems.     Medication Adjustments/Labs and Tests Ordered: Current medicines are reviewed at length with the patient today.  Concerns regarding medicines are outlined above.   Tests Ordered: No orders of the defined types were placed in this encounter.   Medication Changes: Meds ordered this encounter  Medications  . DISCONTD: sacubitril-valsartan (ENTRESTO) 49-51 MG    Sig: Take 1 tablet by mouth 2 (two) times daily.    Dispense:  60 tablet    Refill:  11    Follow Up:  In Person Dr. Marlou Porch in 1 year  Signed, Kathyrn Drown, NP  05/20/2020 11:16 AM    [place]

## 2020-05-20 ENCOUNTER — Encounter: Payer: Self-pay | Admitting: Cardiology

## 2020-05-20 ENCOUNTER — Telehealth (INDEPENDENT_AMBULATORY_CARE_PROVIDER_SITE_OTHER): Payer: Medicare Other | Admitting: Cardiology

## 2020-05-20 ENCOUNTER — Other Ambulatory Visit: Payer: Self-pay

## 2020-05-20 VITALS — BP 122/64 | HR 42 | Ht 67.0 in | Wt 190.0 lb

## 2020-05-20 DIAGNOSIS — I10 Essential (primary) hypertension: Secondary | ICD-10-CM | POA: Diagnosis not present

## 2020-05-20 DIAGNOSIS — R001 Bradycardia, unspecified: Secondary | ICD-10-CM | POA: Diagnosis not present

## 2020-05-20 DIAGNOSIS — I4891 Unspecified atrial fibrillation: Secondary | ICD-10-CM | POA: Diagnosis not present

## 2020-05-20 DIAGNOSIS — I483 Typical atrial flutter: Secondary | ICD-10-CM

## 2020-05-20 DIAGNOSIS — N184 Chronic kidney disease, stage 4 (severe): Secondary | ICD-10-CM

## 2020-05-20 MED ORDER — ENTRESTO 49-51 MG PO TABS: 1.0000 | ORAL_TABLET | Freq: Two times a day (BID) | ORAL | 11 refills | Status: DC

## 2020-05-20 NOTE — Patient Instructions (Addendum)
Medication Instructions:  Your physician recommends that you continue on your current medications as directed. Please refer to the Current Medication list given to you today.  *If you need a refill on your cardiac medications before your next appointment, please call your pharmacy*   Lab Work: NONE If you have labs (blood work) drawn today and your tests are completely normal, you will receive your results only by: [name] Kitchen MyChart Message (if you have MyChart) OR . A paper copy in the mail If you have any lab test that is abnormal or we need to change your treatment, we will call you to review the results.   Testing/Procedures: NONE   Follow-Up: At [facility], you and your health needs are our priority.  As part of our continuing mission to provide you with exceptional heart care, we have created designated Provider Care Teams.  These Care Teams include your primary Cardiologist (physician) and Advanced Practice Providers (APPs -  Physician Assistants and Nurse Practitioners) who all work together to provide you with the care you need, when you need it.  We recommend signing up for the patient portal called "MyChart".  Sign up information is provided on this After Visit Summary.  MyChart is used to connect with patients for Virtual Visits (Telemedicine).  Patients are able to view lab/test results, encounter notes, upcoming appointments, etc.  Non-urgent messages can be sent to your provider as well.   To learn more about what you can do with MyChart, go to [email or web address].    Your next appointment:   1 year(s)  The format for your next appointment:   In Person  Provider:   You may see Candee Furbish, MD or one of the following Advanced Practice Providers on your designated Care Team:    Kathyrn Drown, NP

## 2020-08-27 NOTE — Progress Notes (Signed)
PCP:  Ginger Organ., MD Primary Cardiologist: Candee Furbish, MD Electrophysiologist: Cristopher Peru, MD   Ralph Bell is a 80 y.o. male with h/o sinus bradycardia and atrial flutter s/p ablation seen today for Cristopher Peru, MD for acute visit due to fatigue.  Since last being seen in our clinic the patient reports doing overall doing well, but does note fatigue that he had not previously noticed.   He recently spent 2 weeks in [place] which involved walking 2-5 miles daily on tours. He would need to sit for a few minutes after approx 30 minutes and rest prior to continuing. Now at home, he can work in the yard for 30-45 minutes, sit to take a 5 minutes rest due to fatigue, but then go for another 30-45 minutes without difficulty. He walks 1-1.5 miles daily. He reports by fit bit his HRs get up into upper 70s with activity. He denies chest pain, palpitations, dyspnea, PND, orthopnea, nausea, vomiting, dizziness, edema, weight gain, or early satiety. He had one episode of syncope approximately a year ago while at the beach after having 2 beers which is unusual for him.   Past Medical History:  Diagnosis Date  . ALLERGIC RHINITIS 11/09/2006  . ASTHMA 11/16/2006  . CAD (coronary artery disease)    a. calcification by CT imaging in 2010 b.  low-risk NST in 10/2018  . DIVERTICULITIS, HX OF 11/16/2006  . ED (erectile dysfunction)   . Family hx colonic polyps   . History of kidney stones   . HYPERLIPIDEMIA 11/09/2006  . HYPERTENSION 11/09/2006  . Rosacea   . Shortness of breath 09/05/2008   Past Surgical History:  Procedure Laterality Date  . A-FLUTTER ABLATION N/A 08/06/2019   Procedure: A-FLUTTER ABLATION;  Surgeon: Evans Lance, MD;  Location: [facility];  Service: Cardiovascular;  Laterality: N/A;  . CARDIOVERSION N/A 04/27/2019   Procedure: CARDIOVERSION;  Surgeon: Josue Hector, MD;  Location: [facility] ENDOSCOPY;  Service: Cardiovascular;  Laterality: N/A;    Current Outpatient  Medications  Medication Sig Dispense Refill  . acetaminophen (TYLENOL) 500 MG tablet Take 1,000 mg by mouth every 6 (six) hours as needed for mild pain.    [name] Kitchen allopurinol (ZYLOPRIM) 100 MG tablet Take 100 mg by mouth daily.    [name] Kitchen amLODipine (NORVASC) 2.5 MG tablet TAKE 1 TABLET BY MOUTH  DAILY 90 tablet 3  . aspirin 81 MG EC tablet daily.    [name] Kitchen atorvastatin (LIPITOR) 40 MG tablet TAKE 1 TABLET BY MOUTH  DAILY 90 tablet 3  . doxazosin (CARDURA) 8 MG tablet TAKE 1 TABLET BY MOUTH AT  BEDTIME (Patient taking differently: 4 mg.) 90 tablet 0  . FARXIGA 10 MG TABS tablet Take 10 mg by mouth daily.    . fexofenadine (ALLEGRA) 180 MG tablet Take 1 tablet (180 mg total) by mouth daily. 90 tablet 3  . glucose blood (ACCU-CHEK AVIVA) test strip Use daily as directed 100 each 6  . losartan (COZAAR) 25 MG tablet Take 25 mg by mouth daily.    . metroNIDAZOLE (METROGEL) 1 % gel Apply 1 application topically as needed (Rosacea).     No current facility-administered medications for this visit.    Allergies  Allergen Reactions  . Other Other (See Comments)  . Sulfacetamide Sodium Other (See Comments)    Unknown (Childhood)    Social History   Socioeconomic History  . Marital status: Married    Spouse name: Not on file  . Number  of children: Not on file  . Years of education: Not on file  . Highest education level: Not on file  Occupational History  . Not on file  Tobacco Use  . Smoking status: Former Smoker    Quit date: 04/05/1973    Years since quitting: 47.4  . Smokeless tobacco: Former Systems developer    Quit date: 04/05/1978  Vaping Use  . Vaping Use: Never used  Substance and Sexual Activity  . Alcohol use: Yes    Alcohol/week: 1.0 standard drink    Types: 1 Glasses of wine per week  . Drug use: No  . Sexual activity: Not on file  Other Topics Concern  . Not on file  Social History Narrative  . Not on file   Social Determinants of Health   Financial Resource Strain: Not on file  Food  Insecurity: Not on file  Transportation Needs: Not on file  Physical Activity: Not on file  Stress: Not on file  Social Connections: Not on file  Intimate Partner Violence: Not on file    Review of Systems: All other systems reviewed and are otherwise negative except as noted above.  Physical Exam: Vitals:   08/28/20 0819  BP: (!) 150/80  Pulse: (!) 48  SpO2: 99%  Weight: 194 lb (88 kg)  Height: 5\' 7"  (1.702 m)    GEN- The patient is well appearing, alert and oriented x 3 today.   HEENT: normocephalic, atraumatic; sclera clear, conjunctiva pink; hearing intact; oropharynx clear; neck supple, no JVP Lymph- no cervical lymphadenopathy Lungs- Clear to ausculation bilaterally, normal work of breathing.  No wheezes, rales, rhonchi Heart- Regular rate and rhythm, no murmurs, rubs or gallops, PMI not laterally displaced GI- soft, non-tender, non-distended, bowel sounds present, no hepatosplenomegaly Extremities- no clubbing, cyanosis, or edema; DP/PT/radial pulses 2+ bilaterally MS- no significant deformity or atrophy Skin- warm and dry, no rash or lesion Psych- euthymic mood, full affect Neuro- strength and sensation are intact  EKG is ordered. Personal review of EKG from today shows sinus bradycardia at 48 bpm with PACs. No obvious AV block  Additional studies reviewed include: Previous EP notes Echo 10/2018 with normal EF  Assessment and Plan:  1. Sinus Node dysfunction HR 48 today, gets up into 70s with activity.  This is not clearly the cause of his fatigue and he has no urgent indication for pacing.  Discussed with Dr. Lovena Le. Will order Stress Echo for EF, HR response to exercise, and will also assess for ischemia. If unremarkable, his fatigue is likely multifactorial to include deconditioning. If indicated for pacing, will proceed at that time.   2. Atrial flutter s/p ablation Without recurrent symptomatic atrial flutter  3. Sleep Apnea Encouraged continued nightly  use   4. Fatigue Mostly occurred with significant walking in [place] several weeks ago. He states it had been "years" since he'd had that kind of exertion. He is relatively stable at home, having to take 5 minute breaks after 30-45 minutes of yard work. No SOB. Has tested negative for COVID before going to [place] and since returning  Shirley Friar, [place]  08/28/20 9:27 AM   Greater than 50% of the 40 minute visit was spent in counseling/coordination of care regarding disease state education, discussing indications for testing and pacemakers, risk factors, medication/therapy compliance, and direct consultation with an MD.

## 2020-08-28 ENCOUNTER — Other Ambulatory Visit: Payer: Self-pay

## 2020-08-28 ENCOUNTER — Ambulatory Visit: Payer: Medicare Other | Admitting: Student

## 2020-08-28 VITALS — BP 150/80 | HR 48 | Ht 67.0 in | Wt 194.0 lb

## 2020-08-28 DIAGNOSIS — I483 Typical atrial flutter: Secondary | ICD-10-CM

## 2020-08-28 DIAGNOSIS — G4733 Obstructive sleep apnea (adult) (pediatric): Secondary | ICD-10-CM | POA: Diagnosis not present

## 2020-08-28 DIAGNOSIS — R5383 Other fatigue: Secondary | ICD-10-CM | POA: Diagnosis not present

## 2020-08-28 DIAGNOSIS — R001 Bradycardia, unspecified: Secondary | ICD-10-CM | POA: Diagnosis not present

## 2020-08-28 NOTE — Addendum Note (Signed)
Addended by: Barrington Ellison A on: 08/28/2020 03:21 PM   Modules accepted: Orders, Level of Service

## 2020-08-28 NOTE — Patient Instructions (Signed)
Medication Instructions:  Your physician recommends that you continue on your current medications as directed. Please refer to the Current Medication list given to you today.  *If you need a refill on your cardiac medications before your next appointment, please call your pharmacy*   Lab Work: None If you have labs (blood work) drawn today and your tests are completely normal, you will receive your results only by: [name] Kitchen MyChart Message (if you have MyChart) OR . A paper copy in the mail If you have any lab test that is abnormal or we need to change your treatment, we will call you to review the results.   Testing/Procedures: Your physician has requested that you have a stress echocardiogram. For further information please visit [email or web address]. Please follow instruction sheet as given.   Follow-Up: At [facility], you and your health needs are our priority.  As part of our continuing mission to provide you with exceptional heart care, we have created designated Provider Care Teams.  These Care Teams include your primary Cardiologist (physician) and Advanced Practice Providers (APPs -  Physician Assistants and Nurse Practitioners) who all work together to provide you with the care you need, when you need it.  Your next appointment:   As scheduled

## 2020-08-28 NOTE — Addendum Note (Signed)
Addended by: Carylon Perches on: 08/28/2020 10:58 AM   Modules accepted: Orders

## 2020-09-25 ENCOUNTER — Telehealth (HOSPITAL_COMMUNITY): Payer: Self-pay | Admitting: *Deleted

## 2020-09-25 ENCOUNTER — Ambulatory Visit: Payer: Medicare Other | Admitting: Internal Medicine

## 2020-09-25 NOTE — Telephone Encounter (Signed)
Let message with stress echo instructions. Patient told to arrive by 1:30.  Kirstie Peri

## 2020-10-01 ENCOUNTER — Ambulatory Visit (HOSPITAL_COMMUNITY): Payer: Medicare Other | Attending: Internal Medicine

## 2020-10-01 ENCOUNTER — Other Ambulatory Visit: Payer: Self-pay

## 2020-10-01 ENCOUNTER — Ambulatory Visit (HOSPITAL_COMMUNITY): Payer: Medicare Other

## 2020-10-01 DIAGNOSIS — R5383 Other fatigue: Secondary | ICD-10-CM

## 2020-10-01 DIAGNOSIS — R001 Bradycardia, unspecified: Secondary | ICD-10-CM | POA: Insufficient documentation

## 2020-10-01 LAB — ECHOCARDIOGRAM STRESS TEST
Area-P 1/2: 2.84 cm2
P 1/2 time: 1097 msec
S' Lateral: 3.1 cm

## 2020-10-01 MED ORDER — PERFLUTREN LIPID MICROSPHERE
1.0000 mL | INTRAVENOUS | Status: AC | PRN
  Administered 2020-10-01: 5 mL via INTRAVENOUS

## 2020-10-21 ENCOUNTER — Encounter: Payer: Self-pay | Admitting: Internal Medicine

## 2020-10-21 ENCOUNTER — Ambulatory Visit: Payer: Medicare Other | Admitting: Internal Medicine

## 2020-10-21 ENCOUNTER — Other Ambulatory Visit: Payer: Self-pay

## 2020-10-21 VITALS — BP 154/72 | HR 50 | Ht 67.0 in | Wt 193.4 lb

## 2020-10-21 DIAGNOSIS — R001 Bradycardia, unspecified: Secondary | ICD-10-CM | POA: Diagnosis not present

## 2020-10-21 DIAGNOSIS — I483 Typical atrial flutter: Secondary | ICD-10-CM

## 2020-10-21 NOTE — Patient Instructions (Signed)

## 2020-10-21 NOTE — Progress Notes (Signed)
HPI [name] returns today for followup of a stress echo. He has a h/o atrial flutter and is s/p ablation over a year ago. He has resting sinus bradycardia but during his stress test where he walked less than 4 minutes his HR went up to over 125/min. He stopped due to fatigue/exhaustion. He does not have chest pain and his EF improved with exertion and there were no wall motion abnormalities. He has not had syncope. He does admit to not doing much aerobically.  Allergies  Allergen Reactions   Other Other (See Comments)   Sulfacetamide Sodium Other (See Comments)    Unknown (Childhood)     Current Outpatient Medications  Medication Sig Dispense Refill   acetaminophen (TYLENOL) 500 MG tablet Take 1,000 mg by mouth every 6 (six) hours as needed for mild pain.     allopurinol (ZYLOPRIM) 100 MG tablet Take 100 mg by mouth daily.     amLODipine (NORVASC) 2.5 MG tablet TAKE 1 TABLET BY MOUTH  DAILY 90 tablet 3   aspirin 81 MG EC tablet daily.     atorvastatin (LIPITOR) 40 MG tablet TAKE 1 TABLET BY MOUTH  DAILY 90 tablet 3   doxazosin (CARDURA) 8 MG tablet TAKE 1 TABLET BY MOUTH AT  BEDTIME 90 tablet 0   FARXIGA 10 MG TABS tablet Take 10 mg by mouth daily.     fexofenadine (ALLEGRA) 180 MG tablet Take 1 tablet (180 mg total) by mouth daily. 90 tablet 3   losartan (COZAAR) 25 MG tablet Take 25 mg by mouth daily.     No current facility-administered medications for this visit.     Past Medical History:  Diagnosis Date   ALLERGIC RHINITIS 11/09/2006   ASTHMA 11/16/2006   CAD (coronary artery disease)    a. calcification by CT imaging in 2010 b.  low-risk NST in 10/2018   DIVERTICULITIS, HX OF 11/16/2006   ED (erectile dysfunction)    Family hx colonic polyps    History of kidney stones    HYPERLIPIDEMIA 11/09/2006   HYPERTENSION 11/09/2006   Rosacea    Shortness of breath 09/05/2008    ROS:   All systems reviewed and negative except as noted in the HPI.   Past Surgical History:   Procedure Laterality Date   A-FLUTTER ABLATION N/A 08/06/2019   Procedure: A-FLUTTER ABLATION;  Surgeon: Evans Lance, MD;  Location: [place] [facility];  Service: Cardiovascular;  Laterality: N/A;   CARDIOVERSION N/A 04/27/2019   Procedure: CARDIOVERSION;  Surgeon: Josue Hector, MD;  Location: [facility] ENDOSCOPY;  Service: Cardiovascular;  Laterality: N/A;     Family History  Problem Relation Age of Onset   Hypertension Father      Social History   Socioeconomic History   Marital status: Married    Spouse name: Not on file   Number of children: Not on file   Years of education: Not on file   Highest education level: Not on file  Occupational History   Not on file  Tobacco Use   Smoking status: Former    Types: Cigarettes    Quit date: 04/05/1973    Years since quitting: 47.5   Smokeless tobacco: Former    Quit date: 04/05/1978  Vaping Use   Vaping Use: Never used  Substance and Sexual Activity   Alcohol use: Yes    Alcohol/week: 1.0 standard drink    Types: 1 Glasses of wine per week   Drug use: No  Sexual activity: Not on file  Other Topics Concern   Not on file  Social History Narrative   Not on file   Social Determinants of Health   Financial Resource Strain: Not on file  Food Insecurity: Not on file  Transportation Needs: Not on file  Physical Activity: Not on file  Stress: Not on file  Social Connections: Not on file  Intimate Partner Violence: Not on file     BP (!) 154/72   Pulse (!) 50   Ht 5\' 7"  (1.702 m)   Wt 193 lb 6.4 oz (87.7 kg)   SpO2 99%   BMI 30.29 kg/m   Physical Exam:  Well appearing but overweight 80 yo man, NAD HEENT: Unremarkable Neck:  No JVD, no thyromegally Lymphatics:  No adenopathy Back:  No CVA tenderness Lungs:  Clear with no wheezes HEART:  Regular rate rhythm, no murmurs, no rubs, no clicks Abd:  soft, positive bowel sounds, no organomegally, no rebound, no guarding Ext:  2 plus pulses, no edema, no cyanosis, no  clubbing Skin:  No rashes no nodules Neuro:  CN II through XII intact, motor grossly intact  Assess/Plan:  Dyspnea with exertion - his stress test suggests that he is deconditioned. He is not limited by bradycardia. He had appropriate response of his HR to exercise. Atrial flutter - he is s/p ablation and has not had a recurrence. HTN - his bp is up and down. I did not change his meds today but told him to lose 20 lbs and exercise more and reduce his salt intake. Sinus node dysfunction - he has resting brady but his HR improves appropriately. He may eventually require PPM but currently no indication. I recommended watchful waiting.  Ralph Overlie Jenah Vanasten,MD

## 2021-08-03 IMAGING — CT CT ANGIO CHEST
3 of 8 series · 17 of 36 positions shown · IV contrast (omnipaque)
Comparison: None.

CLINICAL DATA: Abdominal distention. Evaluate for mass. Shortness
of breath.

EXAM:
CT ANGIOGRAPHY CHEST
CT ABDOMEN AND PELVIS WITH CONTRAST
TECHNIQUE: Multidetector CT imaging of the chest was performed using the
standard protocol during bolus administration of intravenous
contrast. Multiplanar CT image reconstructions and MIPs were
obtained to evaluate the vascular anatomy. Multidetector CT imaging
of the abdomen and pelvis was performed using the standard protocol
during bolus administration of intravenous contrast.
CONTRAST:  80mL OMNIPAQUE IOHEXOL 350 MG/ML SOLN

[Series 6: pe 2mm · axial · 0.95mm/px · z∈[+1204,+1448]mm · 11 of 148 slices shown]
[im 13/148  lung]
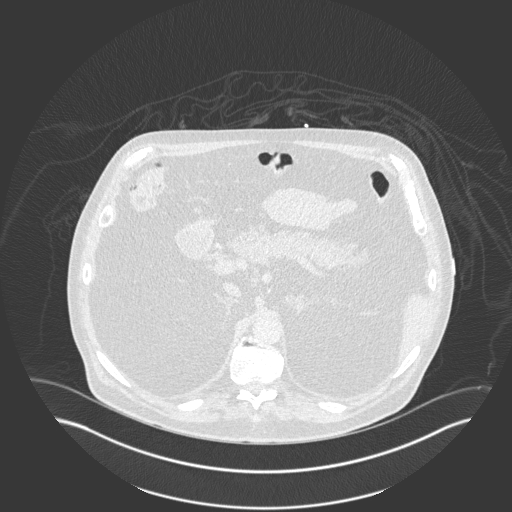
[im 25/148  mediastinal]
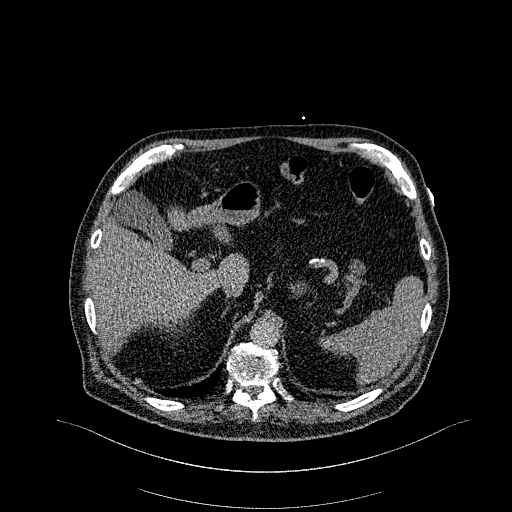
[im 37/148  lung]
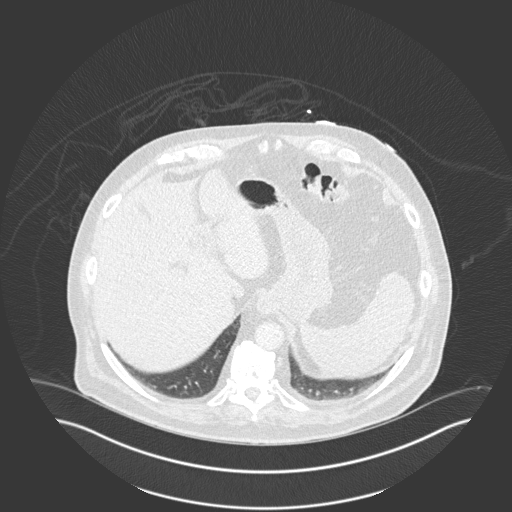
[im 50/148  mediastinal]
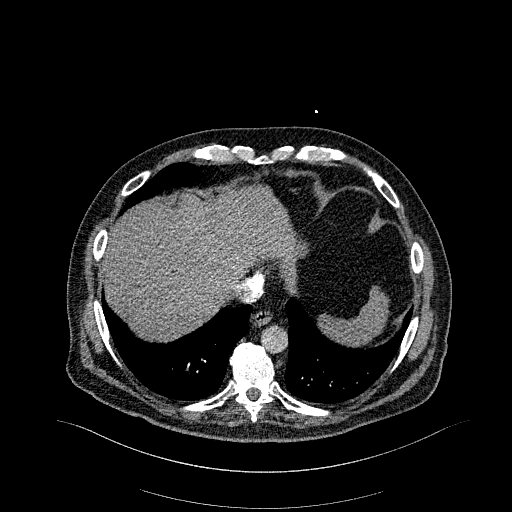
[im 62/148  lung]
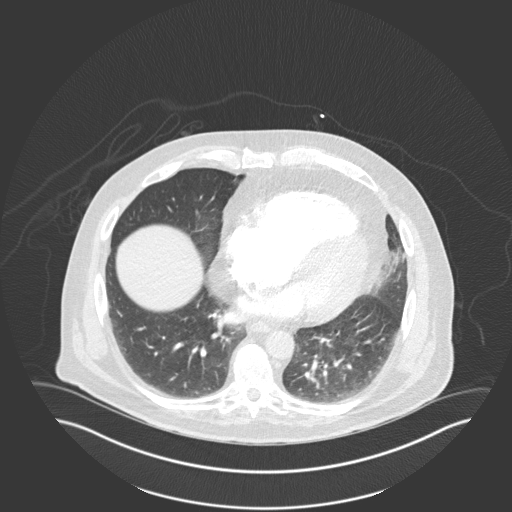
[im 74/148  mediastinal]
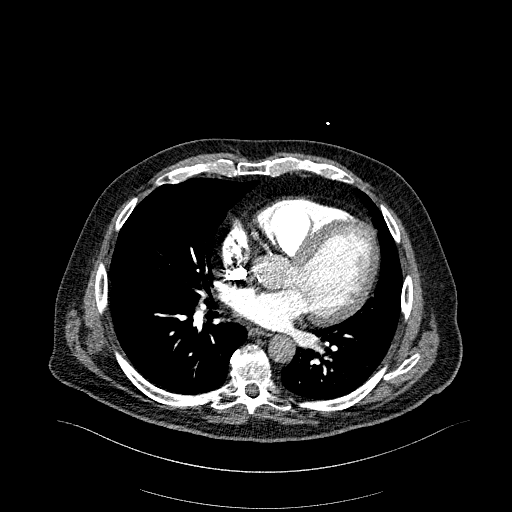
[im 86/148  lung]
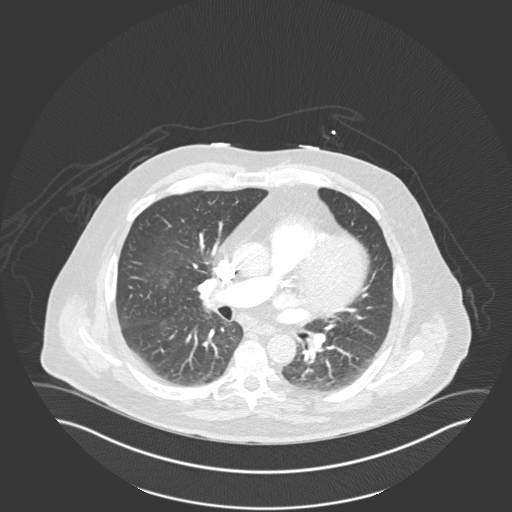
[im 99/148  mediastinal]
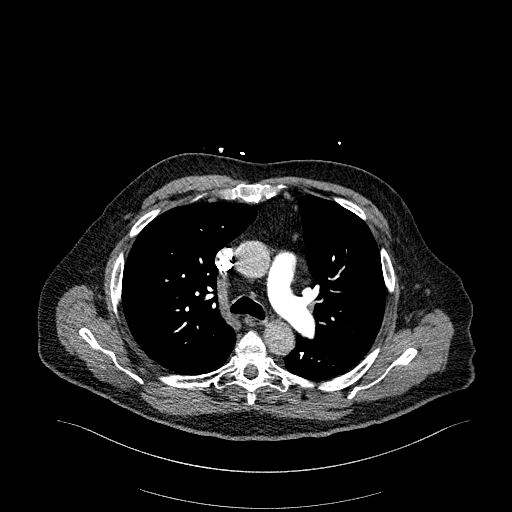
[im 111/148  lung]
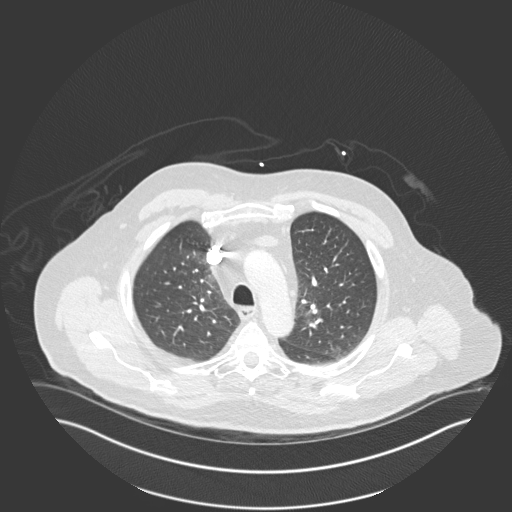
[im 123/148  mediastinal]
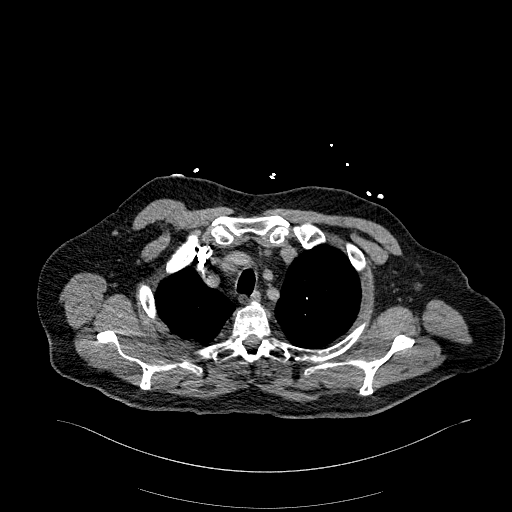
[im 135/148  lung]
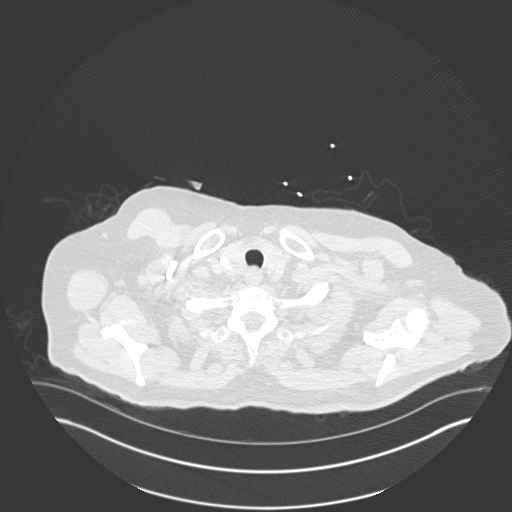

[Series 8: pe lung · axial · 0.78mm/px · z∈[+1204,+1344]mm · 5 of 140 slices shown]
[im 12/140  mediastinal]
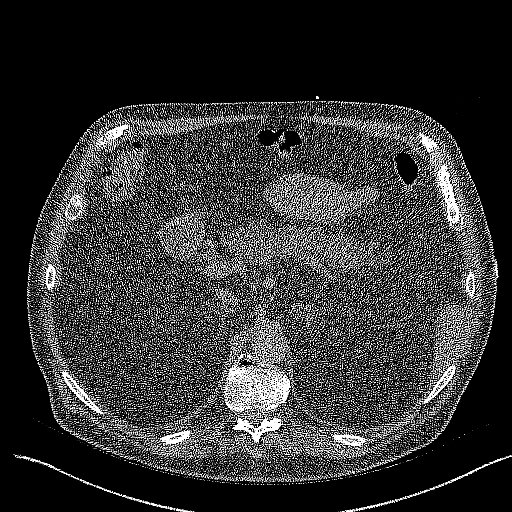
[im 35/140  mediastinal]
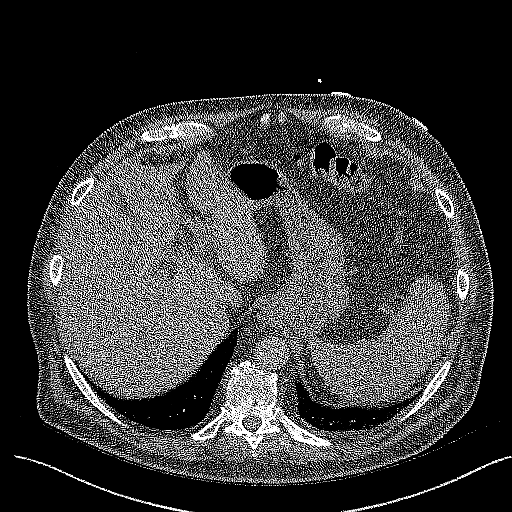
[im 47/140  mediastinal]
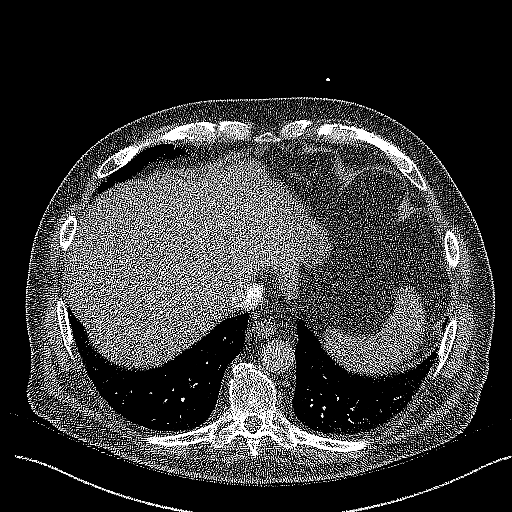
[im 58/140  mediastinal]
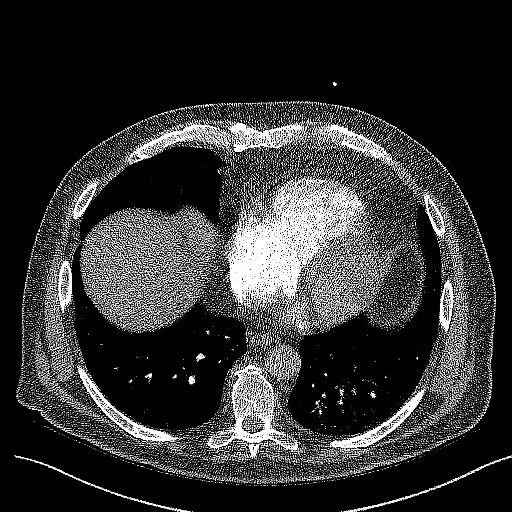
[im 82/140  mediastinal]
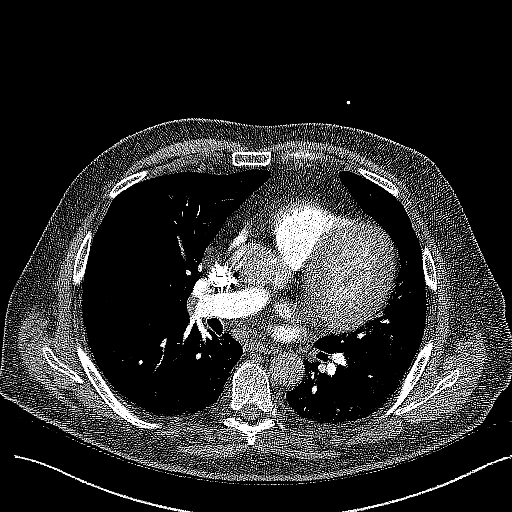

[Series 9: pe 2mm cor · coronal · 0.59mm/px · 1 of 158 slices shown]
[im 79/158  mediastinal]
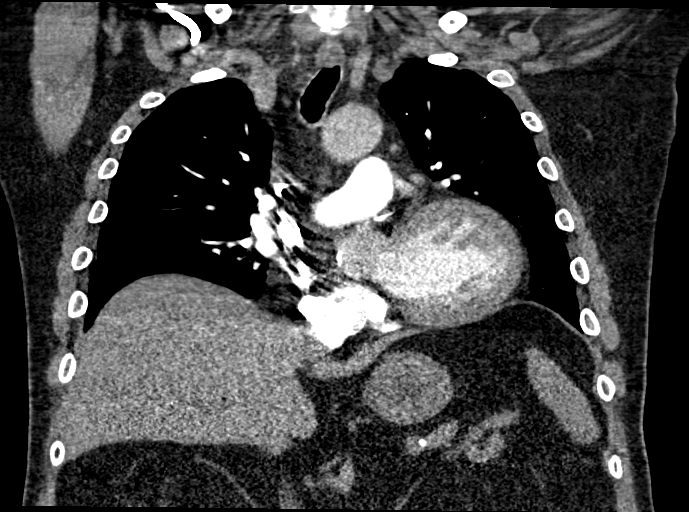

[17 of 36 positions shown; findings below may reference images not displayed]

FINDINGS: CTA CHEST FINDINGS

Cardiovascular: Heart size appears within normal limits. There is no
pericardial effusion identified. Aortic atherosclerosis. Lad and RCA
coronary artery calcifications.

The main pulmonary artery appears patent. No central obstructing
pulmonary emboli identified. No lobar or segmental pulmonary artery
filling defects identified bilaterally.

Mediastinum/Nodes: Right lobe of thyroid gland nodule is identified
measuring 2.3 cm, image 35/7. The trachea appears patent and is
midline. Normal appearance of the esophagus. No mediastinal or hilar
adenopathy.

Lungs/Pleura: No pleural effusion identified. No suspicious
pulmonary nodule or mass identified.

Musculoskeletal: Spondylosis identified. No aggressive lytic or
sclerotic bone lesions.

Review of the MIP images confirms the above findings.

CT ABDOMEN and PELVIS FINDINGS

Hepatobiliary: No focal liver abnormality is seen. No gallstones,
gallbladder wall thickening, or biliary dilatation.

Pancreas: Unremarkable. No pancreatic ductal dilatation or
surrounding inflammatory changes.

Spleen: Normal in size without focal abnormality.

Adrenals/Urinary Tract: Normal appearance of the adrenal glands.
cm right kidney cyst. Bilateral perinephric fat stranding is noted,
nonspecific. This may be related to prior insult. No mass or
hydronephrosis identified at this time. Urinary bladder appears
unremarkable.

Stomach/Bowel: Stomach is within normal limits. Appendix appears
normal. No evidence of bowel wall thickening, distention, or
inflammatory changes.

Vascular/Lymphatic: Aortic atherosclerosis. No enlarged
abdominopelvic lymph nodes.

Reproductive: Mild prostate gland enlargement.

Other: No free fluid or fluid collections.

Musculoskeletal: Spondylosis identified scratch set mild lumbar
spondylosis. No acute or suspicious osseous abnormality.

Review of the MIP images confirms the above findings.
IMPRESSION: 1. No evidence for acute pulmonary embolus.
2. No acute findings identified within the abdomen or pelvis.
3. Aortic atherosclerosis. Coronary artery calcifications noted.
4. Right lobe of thyroid gland nodule measures 2.3 cm. Consider
further evaluation with thyroid ultrasound. If patient is clinically
hyperthyroid, consider nuclear medicine thyroid uptake and scan.

Aortic Atherosclerosis ([id number]).

## 2022-01-08 ENCOUNTER — Ambulatory Visit: Payer: Medicare Other | Attending: Internal Medicine | Admitting: Internal Medicine

## 2022-01-08 ENCOUNTER — Encounter: Payer: Self-pay | Admitting: Internal Medicine

## 2022-01-08 VITALS — BP 142/68 | HR 43 | Ht 67.0 in | Wt 190.0 lb

## 2022-01-08 DIAGNOSIS — I10 Essential (primary) hypertension: Secondary | ICD-10-CM | POA: Diagnosis present

## 2022-01-08 DIAGNOSIS — I483 Typical atrial flutter: Secondary | ICD-10-CM | POA: Diagnosis present

## 2022-01-08 NOTE — Patient Instructions (Signed)

## 2022-01-08 NOTE — Progress Notes (Signed)
HPI Ralph Bell returns today for followup. He has a h/o atrial flutter and is s/p ablation over 2 years ago. He has resting sinus bradycardia but during his stress test where he walked less than 4 minutes his HR went up to over 125/min. He stopped due to fatigue/exhaustion. He does not have chest pain and his EF improved with exertion and there were no wall motion abnormalities. He has not had syncope. He does admit to not doing much aerobically. He feels well however.  Allergies  Allergen Reactions   Other Other (See Comments)   Sulfacetamide Sodium Other (See Comments)    Unknown (Childhood)     Current Outpatient Medications  Medication Sig Dispense Refill   acetaminophen (TYLENOL) 500 MG tablet Take 1,000 mg by mouth every 6 (six) hours as needed for mild pain.     allopurinol (ZYLOPRIM) 100 MG tablet Take 100 mg by mouth daily.     amLODipine (NORVASC) 2.5 MG tablet TAKE 1 TABLET BY MOUTH  DAILY 90 tablet 3   aspirin 81 MG EC tablet daily.     atorvastatin (LIPITOR) 40 MG tablet TAKE 1 TABLET BY MOUTH  DAILY 90 tablet 3   doxazosin (CARDURA) 8 MG tablet TAKE 1 TABLET BY MOUTH AT  BEDTIME 90 tablet 0   FARXIGA 10 MG TABS tablet Take 10 mg by mouth daily.     fexofenadine (ALLEGRA) 180 MG tablet Take 1 tablet (180 mg total) by mouth daily. 90 tablet 3   losartan (COZAAR) 25 MG tablet Take 25 mg by mouth in the morning and at bedtime.     No current facility-administered medications for this visit.     Past Medical History:  Diagnosis Date   ALLERGIC RHINITIS 11/09/2006   ASTHMA 11/16/2006   CAD (coronary artery disease)    a. calcification by CT imaging in 2010 b.  low-risk NST in 10/2018   DIVERTICULITIS, HX OF 11/16/2006   ED (erectile dysfunction)    Family hx colonic polyps    History of kidney stones    HYPERLIPIDEMIA 11/09/2006   HYPERTENSION 11/09/2006   Rosacea    Shortness of breath 09/05/2008    ROS:   All systems reviewed and negative except as noted in the  HPI.   Past Surgical History:  Procedure Laterality Date   A-FLUTTER ABLATION N/A 08/06/2019   Procedure: A-FLUTTER ABLATION;  Surgeon: Ralph Lance, MD;  Location: [place] [facility];  Service: Cardiovascular;  Laterality: N/A;   CARDIOVERSION N/A 04/27/2019   Procedure: CARDIOVERSION;  Surgeon: Ralph Hector, MD;  Location: [facility] ENDOSCOPY;  Service: Cardiovascular;  Laterality: N/A;     Family History  Problem Relation Age of Onset   Hypertension Father      Social History   Socioeconomic History   Marital status: Married    Spouse name: Not on file   Number of children: Not on file   Years of education: Not on file   Highest education level: Not on file  Occupational History   Not on file  Tobacco Use   Smoking status: Former    Types: Cigarettes    Quit date: 04/05/1973    Years since quitting: 48.7   Smokeless tobacco: Former    Quit date: 04/05/1978  Vaping Use   Vaping Use: Never used  Substance and Sexual Activity   Alcohol use: Yes    Alcohol/week: 1.0 standard drink of alcohol    Types: 1 Glasses of wine per  week   Drug use: No   Sexual activity: Not on file  Other Topics Concern   Not on file  Social History Narrative   Not on file   Social Determinants of Health   Financial Resource Strain: Not on file  Food Insecurity: Not on file  Transportation Needs: Not on file  Physical Activity: Not on file  Stress: Not on file  Social Connections: Not on file  Intimate Partner Violence: Not on file     BP (!) 142/68   Pulse (!) 43   Ht '5\' 7"'$  (1.702 m)   Wt 190 lb (86.2 kg)   SpO2 95%   BMI 29.76 kg/m   Physical Exam:  Well appearing NAD HEENT: Unremarkable Neck:  No JVD, no thyromegally Lymphatics:  No adenopathy Back:  No CVA tenderness Lungs:  Clear with no wheezes HEART:  Regular rate rhythm, no murmurs, no rubs, no clicks Abd:  soft, positive bowel sounds, no organomegally, no rebound, no guarding Ext:  2 plus pulses, no edema, no  cyanosis, no clubbing Skin:  No rashes no nodules Neuro:  CN II through XII intact, motor grossly intact  EKG - marked sinus brady at 43/min  Assess/Plan:   Dyspnea with exertion -this is improved. He is encouraged to remain active.  Atrial flutter - he is s/p ablation and has not had a recurrence. HTN - his bp is up and down. I did not change his meds today but told him to lose 20 lbs and exercise more and reduce his salt intake. Sinus node dysfunction - he has resting brady but his HR improves appropriately. He may eventually require PPM but currently no indication. I recommended watchful waiting.   Ralph Overlie Herman Mell,MD

## 2022-01-18 ENCOUNTER — Encounter: Payer: Self-pay | Admitting: Internal Medicine

## 2023-01-14 NOTE — Progress Notes (Signed)
Electrophysiology Office Note:   Date:  01/17/2023  ID:  Ralph Bell, DOB 02-Dec-1940, MRN [id number]  Primary Cardiologist: Donato Schultz, MD Electrophysiologist: Lewayne Bunting, MD       History of Present Illness:   Ralph Bell is a 82 y.o. male with h/o sinus bradycardia and atrial flutter s/p ablation seen today for routine electrophysiology followup.   Since last being seen in our clinic the patient reports doing very well.  he denies chest pain, palpitations, dyspnea, PND, orthopnea, nausea, vomiting, dizziness, syncope, edema, weight gain, or early satiety.   Review of systems complete and found to be negative unless listed in HPI.   EP Information / Studies Reviewed:    EKG is ordered today. Personal review as below.  EKG Interpretation Date/Time:  Monday January 17 2023 10:24:33 EDT Ventricular Rate:  51 PR Interval:  272 QRS Duration:  86 QT Interval:  414 QTC Calculation: 381 R Axis:   -10  Text Interpretation: Sinus bradycardia with 1st degree A-V block Confirmed by Maxine Glenn [phone] on 01/17/2023 10:34:26 AM    Arrhythmia History  Flutter ablation 08/06/2019 with Dr. Ladona Ridgel  Physical Exam:   VS:  BP (!) 152/80   Pulse (!) 51   Ht 5\' 7"  (1.702 m)   Wt 194 lb (88 kg)   BMI 30.38 kg/m    Wt Readings from Last 3 Encounters:  01/17/23 194 lb (88 kg)  01/08/22 190 lb (86.2 kg)  10/21/20 193 lb 6.4 oz (87.7 kg)     GEN: Well nourished, well developed in no acute distress NECK: No JVD; No carotid bruits CARDIAC: Regular rate and rhythm, no murmurs, rubs, gallops RESPIRATORY:  Clear to auscultation without rales, wheezing or rhonchi  ABDOMEN: Soft, non-tender, non-distended EXTREMITIES:  No edema; No deformity   ASSESSMENT AND PLAN:    SND He has resting bradycardia that improves with exertion. HRs in the 40/50s at rest, up to 70-80s with exertion.  EKG today shows sinus brady.  Currently, no indication for pacing.   Atrial flutter s/p  ablation Without recurrent symptomatic atrial flutter  OSA  Encouraged nightly CPAP   HTN Stable on current regimen  Runs in 130s at home, received a "distressing" call just prior to visit.  Will continue to monitor at home, and if regularly getting > 140 systolic will call to have amlodipine increased  Fatigue Dyspnea on exertion Chronic, stable  Follow up with Dr. Ladona Ridgel in 12 months, sooner with any new symptoms.   Signed, Graciella Freer, PA-C

## 2023-01-17 ENCOUNTER — Encounter: Payer: Self-pay | Admitting: Student

## 2023-01-17 ENCOUNTER — Ambulatory Visit: Payer: Medicare Other | Attending: Student | Admitting: Student

## 2023-01-17 VITALS — BP 152/80 | HR 51 | Ht 67.0 in | Wt 194.0 lb

## 2023-01-17 DIAGNOSIS — R0602 Shortness of breath: Secondary | ICD-10-CM | POA: Insufficient documentation

## 2023-01-17 DIAGNOSIS — I10 Essential (primary) hypertension: Secondary | ICD-10-CM | POA: Diagnosis not present

## 2023-01-17 DIAGNOSIS — I483 Typical atrial flutter: Secondary | ICD-10-CM | POA: Insufficient documentation

## 2023-01-17 NOTE — Patient Instructions (Signed)
Medication Instructions:  Your physician recommends that you continue on your current medications as directed. Please refer to the Current Medication list given to you today.  *If you need a refill on your cardiac medications before your next appointment, please call your pharmacy*  Lab Work: None ordered If you have labs (blood work) drawn today and your tests are completely normal, you will receive your results only by: MyChart Message (if you have MyChart) OR A paper copy in the mail If you have any lab test that is abnormal or we need to change your treatment, we will call you to review the results.  Follow-Up: At [facility], you and your health needs are our priority.  As part of our continuing mission to provide you with exceptional heart care, we have created designated Provider Care Teams.  These Care Teams include your primary Cardiologist (physician) and Advanced Practice Providers (APPs -  Physician Assistants and Nurse Practitioners) who all work together to provide you with the care you need, when you need it.  Your next appointment:   1 year(s)  Provider:   Lewayne Bunting, MD

## 2024-01-12 ENCOUNTER — Encounter: Payer: Self-pay | Admitting: Internal Medicine

## 2024-02-09 ENCOUNTER — Ambulatory Visit: Attending: Internal Medicine | Admitting: Internal Medicine

## 2024-02-09 ENCOUNTER — Encounter: Payer: Self-pay | Admitting: Internal Medicine

## 2024-02-09 VITALS — BP 176/58 | HR 45 | Ht 67.0 in | Wt 186.2 lb

## 2024-02-09 DIAGNOSIS — R0602 Shortness of breath: Secondary | ICD-10-CM | POA: Diagnosis present

## 2024-02-09 DIAGNOSIS — I10 Essential (primary) hypertension: Secondary | ICD-10-CM | POA: Insufficient documentation

## 2024-02-09 DIAGNOSIS — I483 Typical atrial flutter: Secondary | ICD-10-CM | POA: Insufficient documentation

## 2024-02-09 NOTE — Patient Instructions (Signed)

## 2024-02-09 NOTE — Progress Notes (Signed)
 HPI [name] returns today for followup. He has a h/o atrial flutter and is s/p ablation over 4 years ago. He has resting sinus bradycardia but during his stress test where he walked less than 4 minutes his HR went up to over 125/min. He stopped due to fatigue/exhaustion. He does not have chest pain and his EF improved with exertion and there were no wall motion abnormalities. He has not had syncope. He does admit to not doing much aerobically. He feels well however.   Allergies  Allergen Reactions   Other Other (See Comments)   Sulfacetamide Sodium Other (See Comments)    Unknown (Childhood)     Current Outpatient Medications  Medication Sig Dispense Refill   allopurinol  (ZYLOPRIM ) 100 MG tablet Take 100 mg by mouth daily.     amLODipine  (NORVASC ) 2.5 MG tablet TAKE 1 TABLET BY MOUTH  DAILY 90 tablet 3   aspirin 81 MG EC tablet daily.     atorvastatin  (LIPITOR) 40 MG tablet TAKE 1 TABLET BY MOUTH  DAILY 90 tablet 3   chlorthalidone (HYGROTON) 25 MG tablet Take 25 mg by mouth 3 (three) times a week.     doxazosin  (CARDURA ) 8 MG tablet TAKE 1 TABLET BY MOUTH AT  BEDTIME 90 tablet 0   FARXIGA 10 MG TABS tablet Take 10 mg by mouth daily.     fexofenadine  (ALLEGRA ) 180 MG tablet Take 1 tablet (180 mg total) by mouth daily. 90 tablet 3   losartan  (COZAAR ) 25 MG tablet Take 25 mg by mouth in the morning and at bedtime.     No current facility-administered medications for this visit.     Past Medical History:  Diagnosis Date   ALLERGIC RHINITIS 11/09/2006   ASTHMA 11/16/2006   CAD (coronary artery disease)    a. calcification by CT imaging in 2010 b.  low-risk NST in 10/2018   DIVERTICULITIS, HX OF 11/16/2006   ED (erectile dysfunction)    Family hx colonic polyps    History of kidney stones    HYPERLIPIDEMIA 11/09/2006   HYPERTENSION 11/09/2006   Rosacea    Shortness of breath 09/05/2008    ROS:   All systems reviewed and negative except as noted in the HPI.   Past Surgical  History:  Procedure Laterality Date   A-FLUTTER ABLATION N/A 08/06/2019   Procedure: A-FLUTTER ABLATION;  Surgeon: Waddell Danelle ORN, MD;  Location: [facility]IVE [facility];  Service: Cardiovascular;  Laterality: N/A;   CARDIOVERSION N/A 04/27/2019   Procedure: CARDIOVERSION;  Surgeon: Delford Maude BROCKS, MD;  Location: [facility] ENDOSCOPY;  Service: Cardiovascular;  Laterality: N/A;     Family History  Problem Relation Age of Onset   Hypertension Father      Social History   Socioeconomic History   Marital status: Married    Spouse name: Not on file   Number of children: Not on file   Years of education: Not on file   Highest education level: Not on file  Occupational History   Not on file  Tobacco Use   Smoking status: Former    Current packs/day: 0.00    Types: Cigarettes    Quit date: 04/05/1973    Years since quitting: 50.8   Smokeless tobacco: Former    Quit date: 04/05/1978  Vaping Use   Vaping status: Never Used  Substance and Sexual Activity   Alcohol use: Yes    Alcohol/week: 1.0 standard drink of alcohol    Types: 1 Glasses  of wine per week   Drug use: No   Sexual activity: Not on file  Other Topics Concern   Not on file  Social History Narrative   Not on file   Social Drivers of Health   Financial Resource Strain: Not on file  Food Insecurity: Not on file  Transportation Needs: Not on file  Physical Activity: Not on file  Stress: Not on file  Social Connections: Not on file  Intimate Partner Violence: Not on file     BP (!) 176/58 (BP Location: Left Arm, Patient Position: Sitting, Cuff Size: Normal)   Pulse (!) 45   Ht 5' 7 (1.702 m)   Wt 186 lb 3.2 oz (84.5 kg)   SpO2 96%   BMI 29.16 kg/m   Physical Exam:  Well appearing NAD HEENT: Unremarkable Neck:  No JVD, no thyromegally Lymphatics:  No adenopathy Back:  No CVA tenderness Lungs:  Clear HEART:  Regular rate rhythm, no murmurs, no rubs, no clicks Abd:  soft, positive bowel sounds, no organomegally,  no rebound, no guarding Ext:  2 plus pulses, no edema, no cyanosis, no clubbing Skin:  No rashes no nodules Neuro:  CN II through XII intact, motor grossly intact  EKG - sinus brady at 45/min   Assess/Plan:  Dyspnea with exertion -this is improved. He is encouraged to remain active.  Atrial flutter - he is s/p ablation and has not had a recurrence. HTN - his bp is up and down. I did not change his meds today but told him to lose 20 lbs and exercise more and reduce his salt intake. Sinus node dysfunction - he has resting brady but his HR improves appropriately. He may eventually require PPM but currently no indication. I recommended watchful waiting.   Danelle Adessa Primiano,MD
